# Patient Record
Sex: Female | Born: 1935 | Race: White | Hispanic: No | Marital: Single | Smoking: Never smoker
Health system: Southern US, Community
[De-identification: ages and names within clinical notes are randomized; demographics above are authoritative.]

## PROBLEM LIST (undated history)

## (undated) DIAGNOSIS — I2510 Atherosclerotic heart disease of native coronary artery without angina pectoris: Secondary | ICD-10-CM

## (undated) DIAGNOSIS — I6529 Occlusion and stenosis of unspecified carotid artery: Secondary | ICD-10-CM

## (undated) DIAGNOSIS — I214 Non-ST elevation (NSTEMI) myocardial infarction: Secondary | ICD-10-CM

## (undated) DIAGNOSIS — R0609 Other forms of dyspnea: Secondary | ICD-10-CM

## (undated) DIAGNOSIS — R0600 Dyspnea, unspecified: Secondary | ICD-10-CM

## (undated) DIAGNOSIS — R32 Unspecified urinary incontinence: Secondary | ICD-10-CM

## (undated) DIAGNOSIS — IMO0001 Reserved for inherently not codable concepts without codable children: Secondary | ICD-10-CM

## (undated) DIAGNOSIS — N3090 Cystitis, unspecified without hematuria: Secondary | ICD-10-CM

## (undated) DIAGNOSIS — M1990 Unspecified osteoarthritis, unspecified site: Secondary | ICD-10-CM

## (undated) DIAGNOSIS — I8390 Asymptomatic varicose veins of unspecified lower extremity: Secondary | ICD-10-CM

## (undated) DIAGNOSIS — I10 Essential (primary) hypertension: Secondary | ICD-10-CM

## (undated) DIAGNOSIS — M810 Age-related osteoporosis without current pathological fracture: Secondary | ICD-10-CM

## (undated) DIAGNOSIS — E039 Hypothyroidism, unspecified: Secondary | ICD-10-CM

## (undated) DIAGNOSIS — Z8719 Personal history of other diseases of the digestive system: Secondary | ICD-10-CM

## (undated) DIAGNOSIS — E7800 Pure hypercholesterolemia, unspecified: Secondary | ICD-10-CM

## (undated) DIAGNOSIS — K219 Gastro-esophageal reflux disease without esophagitis: Secondary | ICD-10-CM

## (undated) DIAGNOSIS — R110 Nausea: Secondary | ICD-10-CM

## (undated) HISTORY — DX: Unspecified urinary incontinence: R32

## (undated) HISTORY — DX: Cystitis, unspecified without hematuria: N30.90

## (undated) HISTORY — DX: Personal history of other diseases of the digestive system: Z87.19

## (undated) HISTORY — DX: Reserved for inherently not codable concepts without codable children: IMO0001

## (undated) HISTORY — DX: Unspecified osteoarthritis, unspecified site: M19.90

## (undated) HISTORY — PX: OTHER SURGICAL HISTORY: SHX169

## (undated) HISTORY — DX: Nausea: R11.0

## (undated) HISTORY — DX: Age-related osteoporosis without current pathological fracture: M81.0

## (undated) HISTORY — DX: Essential (primary) hypertension: I10

## (undated) HISTORY — DX: Asymptomatic varicose veins of unspecified lower extremity: I83.90

## (undated) HISTORY — DX: Occlusion and stenosis of unspecified carotid artery: I65.29

## (undated) HISTORY — DX: Gastro-esophageal reflux disease without esophagitis: K21.9

## (undated) HISTORY — PX: ABDOMINAL HYSTERECTOMY: SHX81

## (undated) HISTORY — DX: Pure hypercholesterolemia, unspecified: E78.00

## (undated) HISTORY — DX: Hypothyroidism, unspecified: E03.9

---
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
## 1961-02-14 HISTORY — PX: OTHER SURGICAL HISTORY: SHX169

## 1970-02-14 HISTORY — PX: OTHER SURGICAL HISTORY: SHX169

## 1985-02-14 HISTORY — PX: BREAST REDUCTION SURGERY: SHX8

## 1996-02-15 HISTORY — PX: OTHER SURGICAL HISTORY: SHX169

## 2004-03-11 ENCOUNTER — Ambulatory Visit: Payer: Self-pay | Admitting: Unknown Physician Specialty

## 2004-03-31 ENCOUNTER — Ambulatory Visit: Payer: Self-pay | Admitting: Unknown Physician Specialty

## 2004-06-18 ENCOUNTER — Ambulatory Visit: Payer: Self-pay | Admitting: Unknown Physician Specialty

## 2004-12-16 ENCOUNTER — Ambulatory Visit: Payer: Self-pay | Admitting: Unknown Physician Specialty

## 2005-03-17 HISTORY — PX: OTHER SURGICAL HISTORY: SHX169

## 2005-06-02 ENCOUNTER — Ambulatory Visit: Payer: Self-pay | Admitting: Specialist

## 2005-06-29 ENCOUNTER — Ambulatory Visit: Payer: Self-pay | Admitting: Pain Medicine

## 2005-07-18 ENCOUNTER — Ambulatory Visit: Payer: Self-pay | Admitting: Pain Medicine

## 2005-07-19 ENCOUNTER — Ambulatory Visit: Payer: Self-pay | Admitting: Pain Medicine

## 2005-08-02 ENCOUNTER — Ambulatory Visit: Payer: Self-pay | Admitting: Physician Assistant

## 2005-08-09 ENCOUNTER — Ambulatory Visit: Payer: Self-pay | Admitting: Physician Assistant

## 2005-08-12 ENCOUNTER — Ambulatory Visit: Payer: Self-pay | Admitting: Unknown Physician Specialty

## 2005-08-22 ENCOUNTER — Ambulatory Visit: Payer: Self-pay | Admitting: Pain Medicine

## 2005-08-23 ENCOUNTER — Ambulatory Visit: Payer: Self-pay | Admitting: Pain Medicine

## 2005-09-05 ENCOUNTER — Ambulatory Visit: Payer: Self-pay | Admitting: Pain Medicine

## 2005-09-06 ENCOUNTER — Ambulatory Visit: Payer: Self-pay | Admitting: Pain Medicine

## 2005-09-20 ENCOUNTER — Ambulatory Visit: Payer: Self-pay | Admitting: Physician Assistant

## 2005-11-01 ENCOUNTER — Other Ambulatory Visit: Payer: Self-pay

## 2005-11-08 ENCOUNTER — Inpatient Hospital Stay: Payer: Self-pay | Admitting: Unknown Physician Specialty

## 2005-11-15 ENCOUNTER — Encounter: Payer: Self-pay | Admitting: Internal Medicine

## 2006-02-25 ENCOUNTER — Emergency Department: Payer: Self-pay | Admitting: Emergency Medicine

## 2006-08-17 ENCOUNTER — Ambulatory Visit: Payer: Self-pay | Admitting: Unknown Physician Specialty

## 2007-06-08 ENCOUNTER — Encounter: Payer: Self-pay | Admitting: Rheumatology

## 2007-06-15 ENCOUNTER — Encounter: Payer: Self-pay | Admitting: Rheumatology

## 2007-08-23 ENCOUNTER — Ambulatory Visit: Payer: Self-pay | Admitting: Unknown Physician Specialty

## 2007-11-26 ENCOUNTER — Ambulatory Visit: Payer: Self-pay | Admitting: Pain Medicine

## 2007-12-13 ENCOUNTER — Ambulatory Visit: Payer: Self-pay | Admitting: Pain Medicine

## 2007-12-19 ENCOUNTER — Inpatient Hospital Stay: Payer: Self-pay | Admitting: Specialist

## 2007-12-25 ENCOUNTER — Encounter: Payer: Self-pay | Admitting: Internal Medicine

## 2008-02-03 ENCOUNTER — Emergency Department: Payer: Self-pay | Admitting: Emergency Medicine

## 2008-02-19 ENCOUNTER — Encounter: Payer: Self-pay | Admitting: Specialist

## 2008-03-17 ENCOUNTER — Encounter: Payer: Self-pay | Admitting: Specialist

## 2008-07-24 ENCOUNTER — Ambulatory Visit: Payer: Self-pay | Admitting: Specialist

## 2008-07-31 ENCOUNTER — Ambulatory Visit: Payer: Self-pay | Admitting: Specialist

## 2008-08-28 ENCOUNTER — Ambulatory Visit: Payer: Self-pay | Admitting: Unknown Physician Specialty

## 2009-09-10 ENCOUNTER — Ambulatory Visit: Payer: Self-pay | Admitting: Unknown Physician Specialty

## 2010-01-09 ENCOUNTER — Inpatient Hospital Stay: Payer: Self-pay | Admitting: Specialist

## 2010-01-20 ENCOUNTER — Encounter: Payer: Self-pay | Admitting: Internal Medicine

## 2010-01-28 ENCOUNTER — Ambulatory Visit: Payer: Self-pay | Admitting: Internal Medicine

## 2010-07-19 ENCOUNTER — Ambulatory Visit: Payer: Self-pay | Admitting: Unknown Physician Specialty

## 2010-07-21 LAB — PATHOLOGY REPORT

## 2010-08-05 ENCOUNTER — Encounter: Payer: Self-pay | Admitting: Specialist

## 2010-08-15 ENCOUNTER — Encounter: Payer: Self-pay | Admitting: Specialist

## 2010-09-15 ENCOUNTER — Ambulatory Visit: Payer: Self-pay | Admitting: Specialist

## 2010-09-16 ENCOUNTER — Encounter: Payer: Self-pay | Admitting: Specialist

## 2010-09-22 ENCOUNTER — Ambulatory Visit: Payer: Self-pay | Admitting: Specialist

## 2010-10-12 ENCOUNTER — Ambulatory Visit: Payer: Self-pay | Admitting: Unknown Physician Specialty

## 2010-11-29 ENCOUNTER — Ambulatory Visit: Payer: Self-pay | Admitting: Unknown Physician Specialty

## 2011-02-24 DIAGNOSIS — N3020 Other chronic cystitis without hematuria: Secondary | ICD-10-CM | POA: Diagnosis not present

## 2011-03-18 DIAGNOSIS — N3020 Other chronic cystitis without hematuria: Secondary | ICD-10-CM | POA: Diagnosis not present

## 2011-03-31 DIAGNOSIS — Z961 Presence of intraocular lens: Secondary | ICD-10-CM | POA: Diagnosis not present

## 2011-04-27 DIAGNOSIS — N8111 Cystocele, midline: Secondary | ICD-10-CM | POA: Diagnosis not present

## 2011-04-27 DIAGNOSIS — N3946 Mixed incontinence: Secondary | ICD-10-CM | POA: Diagnosis not present

## 2011-04-27 DIAGNOSIS — N3020 Other chronic cystitis without hematuria: Secondary | ICD-10-CM | POA: Diagnosis not present

## 2011-04-27 DIAGNOSIS — R339 Retention of urine, unspecified: Secondary | ICD-10-CM | POA: Diagnosis not present

## 2011-05-12 DIAGNOSIS — E785 Hyperlipidemia, unspecified: Secondary | ICD-10-CM | POA: Diagnosis not present

## 2011-05-12 DIAGNOSIS — E038 Other specified hypothyroidism: Secondary | ICD-10-CM | POA: Diagnosis not present

## 2011-05-24 DIAGNOSIS — E039 Hypothyroidism, unspecified: Secondary | ICD-10-CM | POA: Diagnosis not present

## 2011-05-24 DIAGNOSIS — J069 Acute upper respiratory infection, unspecified: Secondary | ICD-10-CM | POA: Diagnosis not present

## 2011-05-24 DIAGNOSIS — E785 Hyperlipidemia, unspecified: Secondary | ICD-10-CM | POA: Diagnosis not present

## 2011-05-24 DIAGNOSIS — R5381 Other malaise: Secondary | ICD-10-CM | POA: Diagnosis not present

## 2011-05-26 DIAGNOSIS — M19049 Primary osteoarthritis, unspecified hand: Secondary | ICD-10-CM | POA: Diagnosis not present

## 2011-05-31 DIAGNOSIS — M6530 Trigger finger, unspecified finger: Secondary | ICD-10-CM | POA: Diagnosis not present

## 2011-05-31 DIAGNOSIS — M19049 Primary osteoarthritis, unspecified hand: Secondary | ICD-10-CM | POA: Diagnosis not present

## 2011-05-31 DIAGNOSIS — M6740 Ganglion, unspecified site: Secondary | ICD-10-CM | POA: Diagnosis not present

## 2011-06-09 DIAGNOSIS — M6740 Ganglion, unspecified site: Secondary | ICD-10-CM | POA: Diagnosis not present

## 2011-06-09 DIAGNOSIS — M6530 Trigger finger, unspecified finger: Secondary | ICD-10-CM | POA: Diagnosis not present

## 2011-07-06 DIAGNOSIS — H11429 Conjunctival edema, unspecified eye: Secondary | ICD-10-CM | POA: Diagnosis not present

## 2011-07-08 DIAGNOSIS — R339 Retention of urine, unspecified: Secondary | ICD-10-CM | POA: Diagnosis not present

## 2011-07-08 DIAGNOSIS — N3946 Mixed incontinence: Secondary | ICD-10-CM | POA: Diagnosis not present

## 2011-07-08 DIAGNOSIS — N3020 Other chronic cystitis without hematuria: Secondary | ICD-10-CM | POA: Diagnosis not present

## 2011-07-12 DIAGNOSIS — I8310 Varicose veins of unspecified lower extremity with inflammation: Secondary | ICD-10-CM | POA: Diagnosis not present

## 2011-07-12 DIAGNOSIS — M79609 Pain in unspecified limb: Secondary | ICD-10-CM | POA: Diagnosis not present

## 2011-07-12 DIAGNOSIS — H11429 Conjunctival edema, unspecified eye: Secondary | ICD-10-CM | POA: Diagnosis not present

## 2011-07-12 DIAGNOSIS — E785 Hyperlipidemia, unspecified: Secondary | ICD-10-CM | POA: Diagnosis not present

## 2011-07-12 DIAGNOSIS — I6529 Occlusion and stenosis of unspecified carotid artery: Secondary | ICD-10-CM | POA: Diagnosis not present

## 2011-08-03 DIAGNOSIS — N3020 Other chronic cystitis without hematuria: Secondary | ICD-10-CM | POA: Diagnosis not present

## 2011-08-26 DIAGNOSIS — I6529 Occlusion and stenosis of unspecified carotid artery: Secondary | ICD-10-CM | POA: Diagnosis not present

## 2011-08-26 DIAGNOSIS — I10 Essential (primary) hypertension: Secondary | ICD-10-CM | POA: Diagnosis not present

## 2011-10-25 DIAGNOSIS — E785 Hyperlipidemia, unspecified: Secondary | ICD-10-CM | POA: Diagnosis not present

## 2011-10-25 DIAGNOSIS — M810 Age-related osteoporosis without current pathological fracture: Secondary | ICD-10-CM | POA: Diagnosis not present

## 2011-10-25 DIAGNOSIS — E039 Hypothyroidism, unspecified: Secondary | ICD-10-CM | POA: Diagnosis not present

## 2011-10-26 DIAGNOSIS — E785 Hyperlipidemia, unspecified: Secondary | ICD-10-CM | POA: Diagnosis not present

## 2011-10-26 DIAGNOSIS — E039 Hypothyroidism, unspecified: Secondary | ICD-10-CM | POA: Diagnosis not present

## 2011-10-26 DIAGNOSIS — M810 Age-related osteoporosis without current pathological fracture: Secondary | ICD-10-CM | POA: Diagnosis not present

## 2011-11-03 DIAGNOSIS — M6530 Trigger finger, unspecified finger: Secondary | ICD-10-CM | POA: Diagnosis not present

## 2011-11-10 DIAGNOSIS — M6530 Trigger finger, unspecified finger: Secondary | ICD-10-CM | POA: Diagnosis not present

## 2011-11-15 DIAGNOSIS — Z23 Encounter for immunization: Secondary | ICD-10-CM | POA: Diagnosis not present

## 2011-11-23 DIAGNOSIS — N390 Urinary tract infection, site not specified: Secondary | ICD-10-CM | POA: Diagnosis not present

## 2011-11-23 DIAGNOSIS — N3020 Other chronic cystitis without hematuria: Secondary | ICD-10-CM | POA: Insufficient documentation

## 2011-11-23 DIAGNOSIS — N3946 Mixed incontinence: Secondary | ICD-10-CM | POA: Diagnosis not present

## 2011-11-23 DIAGNOSIS — N8111 Cystocele, midline: Secondary | ICD-10-CM | POA: Diagnosis not present

## 2011-11-23 DIAGNOSIS — M545 Low back pain, unspecified: Secondary | ICD-10-CM | POA: Insufficient documentation

## 2011-11-23 DIAGNOSIS — R339 Retention of urine, unspecified: Secondary | ICD-10-CM | POA: Insufficient documentation

## 2011-11-23 DIAGNOSIS — N952 Postmenopausal atrophic vaginitis: Secondary | ICD-10-CM | POA: Insufficient documentation

## 2011-11-25 DIAGNOSIS — M47817 Spondylosis without myelopathy or radiculopathy, lumbosacral region: Secondary | ICD-10-CM | POA: Diagnosis not present

## 2011-11-25 DIAGNOSIS — M6530 Trigger finger, unspecified finger: Secondary | ICD-10-CM | POA: Diagnosis not present

## 2011-12-02 ENCOUNTER — Ambulatory Visit: Payer: Self-pay | Admitting: Obstetrics and Gynecology

## 2011-12-02 DIAGNOSIS — Z1231 Encounter for screening mammogram for malignant neoplasm of breast: Secondary | ICD-10-CM | POA: Diagnosis not present

## 2011-12-09 DIAGNOSIS — E039 Hypothyroidism, unspecified: Secondary | ICD-10-CM | POA: Diagnosis not present

## 2011-12-28 DIAGNOSIS — N3020 Other chronic cystitis without hematuria: Secondary | ICD-10-CM | POA: Diagnosis not present

## 2012-02-28 ENCOUNTER — Ambulatory Visit (INDEPENDENT_AMBULATORY_CARE_PROVIDER_SITE_OTHER): Payer: Medicare Other | Admitting: Internal Medicine

## 2012-02-28 ENCOUNTER — Encounter: Payer: Self-pay | Admitting: Internal Medicine

## 2012-02-28 VITALS — BP 130/86 | HR 84 | Temp 97.6°F | Ht 59.5 in | Wt 145.2 lb

## 2012-02-28 DIAGNOSIS — R030 Elevated blood-pressure reading, without diagnosis of hypertension: Secondary | ICD-10-CM | POA: Diagnosis not present

## 2012-02-28 DIAGNOSIS — E7800 Pure hypercholesterolemia, unspecified: Secondary | ICD-10-CM

## 2012-02-28 DIAGNOSIS — M79609 Pain in unspecified limb: Secondary | ICD-10-CM | POA: Diagnosis not present

## 2012-02-28 DIAGNOSIS — E039 Hypothyroidism, unspecified: Secondary | ICD-10-CM | POA: Diagnosis not present

## 2012-02-28 DIAGNOSIS — M79606 Pain in leg, unspecified: Secondary | ICD-10-CM

## 2012-02-28 DIAGNOSIS — M810 Age-related osteoporosis without current pathological fracture: Secondary | ICD-10-CM

## 2012-02-28 DIAGNOSIS — K219 Gastro-esophageal reflux disease without esophagitis: Secondary | ICD-10-CM

## 2012-02-28 DIAGNOSIS — M1990 Unspecified osteoarthritis, unspecified site: Secondary | ICD-10-CM

## 2012-02-28 DIAGNOSIS — R32 Unspecified urinary incontinence: Secondary | ICD-10-CM

## 2012-02-28 DIAGNOSIS — R29898 Other symptoms and signs involving the musculoskeletal system: Secondary | ICD-10-CM

## 2012-02-28 LAB — BASIC METABOLIC PANEL
Calcium: 10.2 mg/dL (ref 8.4–10.5)
Creatinine, Ser: 1 mg/dL (ref 0.4–1.2)
GFR: 58 mL/min — ABNORMAL LOW (ref >60.00)
Glucose, Bld: 90 mg/dL (ref 70–99)
Sodium: 142 mEq/L (ref 135–145)

## 2012-02-28 LAB — CBC WITH DIFFERENTIAL/PLATELET
Eosinophils Relative: 4.4 % (ref 0.0–5.0)
HCT: 43.5 % (ref 36.0–46.0)
Hemoglobin: 14.5 g/dL (ref 12.0–15.0)
Lymphocytes Relative: 26.3 % (ref 12.0–46.0)
Lymphs Abs: 1.6 10*3/uL (ref 0.7–4.0)
Monocytes Relative: 7.7 % (ref 3.0–12.0)
Neutro Abs: 3.7 10*3/uL (ref 1.4–7.7)
Platelets: 204 10*3/uL (ref 150.0–400.0)
WBC: 6 10*3/uL (ref 4.5–10.5)

## 2012-02-28 LAB — LIPID PANEL
HDL: 41.9 mg/dL (ref >39.00)
Triglycerides: 164 mg/dL — ABNORMAL HIGH (ref 0.0–149.0)
VLDL: 32.8 mg/dL (ref 0.0–40.0)

## 2012-02-28 LAB — HEPATIC FUNCTION PANEL
Albumin: 4.2 g/dL (ref 3.5–5.2)
Alkaline Phosphatase: 44 U/L (ref 39–117)
Total Protein: 7.1 g/dL (ref 6.0–8.3)

## 2012-02-28 LAB — TSH: TSH: 0.64 u[IU]/mL (ref 0.35–5.50)

## 2012-02-28 LAB — MAGNESIUM: Magnesium: 1.8 mg/dL (ref 1.5–2.5)

## 2012-02-28 MED ORDER — RANITIDINE HCL 150 MG PO TABS: 150.0000 mg | ORAL_TABLET | Freq: Every day | ORAL | Status: DC

## 2012-02-28 MED ORDER — CLOTRIMAZOLE-BETAMETHASONE 1-0.05 % EX CREA: TOPICAL_CREAM | Freq: Two times a day (BID) | CUTANEOUS | Status: DC

## 2012-02-28 NOTE — Patient Instructions (Addendum)
Stop the omeprazole.  Start ranitidine 150mg  - one per day.

## 2012-03-02 ENCOUNTER — Encounter: Payer: Self-pay | Admitting: Internal Medicine

## 2012-03-02 DIAGNOSIS — M1990 Unspecified osteoarthritis, unspecified site: Secondary | ICD-10-CM | POA: Insufficient documentation

## 2012-03-02 DIAGNOSIS — E039 Hypothyroidism, unspecified: Secondary | ICD-10-CM | POA: Insufficient documentation

## 2012-03-02 DIAGNOSIS — R32 Unspecified urinary incontinence: Secondary | ICD-10-CM | POA: Insufficient documentation

## 2012-03-02 DIAGNOSIS — K219 Gastro-esophageal reflux disease without esophagitis: Secondary | ICD-10-CM | POA: Insufficient documentation

## 2012-03-02 DIAGNOSIS — M810 Age-related osteoporosis without current pathological fracture: Secondary | ICD-10-CM | POA: Insufficient documentation

## 2012-03-02 NOTE — Assessment & Plan Note (Signed)
On thyroid replacement.  Just recently adjusted.  Check tsh.

## 2012-03-02 NOTE — Progress Notes (Signed)
Subjective:    Patient ID: Julia Patterson, female    DOB: Feb 16, 1929, 79 y.o.   MRN: [id number]  HPI 79 year old female with past history of hypercholesterolemia, hypothyroidism, osteoporosis s/p multiple bisphosphonates and Forteo with spontaneous femur fractures.  She also sees Dr Achilles Dunk for her urinary incontinence.  She comes in today to establish care.  Former patient of Dr Lin Givens.  She reports she has noticed some increased fatigue in her legs.  No pain.  She has had two femur fractures.  Second occurred after treatment with Forteo.  Has recently seen Dr Gavin Potters.  Per pt report - no further medication warranted for the osteoporosis.  Instructed to continue calcium and vitamin D.  No chest pain or tightness.  Breathing stable.  Legs just feel weak.  If standing for any length of time, has to sit down to rest.  She has been experiencing leg cramps.  She had previously started CoQ10 with some improvement.  She is now taking two per day and still will notice some cramping.  Eating and drinking well.  She was questioning if the prilosec could be causing some of the leg cramps.  Felt it started after she started this medication.  She has no acid reflux.  Some persistent "congestion" in her throat.  No swallowing difficulties.    Past Medical History  Diagnosis Date  . GERD (gastroesophageal reflux disease)   . Hypothyroidism   . Hypercholesterolemia   . Urinary incontinence   . Osteoporosis     s/p fosamax, boniva, reclast, forteo.  bilateral spontaneous femur fractures  . Recurrent cystitis   . Varicose veins     chronic venous insufficiency  . Degenerative arthritis     lumbar spine.  spondylolisthesis, hip and leg pain, scoliosis    Current Outpatient Prescriptions on File Prior to Visit  Medication Sig Dispense Refill  . Calcium Citrate-Vitamin D (CITRACAL + D PO) Take by mouth 3 (three) times daily.      Marland Kitchen omeprazole (PRILOSEC) 20 MG capsule Take 20 mg by mouth daily.      .  Potassium Gluconate 595 MG CAPS Take by mouth daily.      . pravastatin (PRAVACHOL) 20 MG tablet Take 20 mg by mouth daily.      . ranitidine (ZANTAC) 150 MG tablet Take 1 tablet (150 mg total) by mouth daily.  30 tablet  5    Review of Systems Patient denies any headache, lightheadedness or dizziness.  No nasal congestion.  Does report feeling as if there is congestion in her throat.  No swallowing problems.  Denies any acid reflux.   No chest pain, tightness or palpitations.  No increased shortness of breath, cough or congestion.  Breathing stable.  No nausea or vomiting.  No abdominal pain or cramping.  No bowel change, such as diarrhea, constipation, BRBPR or melana.  Sees Dr Achilles Dunk for her urinary incontinence.  On Toviaz. Has a persistent "patch" on her back that itches.  She has used various otc creams.  No relief.  No other rash.  Leg cramps and weakness as outlined.      Objective:   Physical Exam Filed Vitals:   02/28/12 0841  BP: 130/86  Pulse: 84  Temp: 97.6 F (22.37 C)   79 year old female in no acute distress.   HEENT:  Nares - clear.  OP- without lesions or erythema.  No sinus tenderness to palpation.  NECK:  Supple, nontender.     HEART:  Appears to be regular. LUNGS:  Without crackles or wheezing audible.  Respirations even and unlabored.   RADIAL PULSE:  Equal bilaterally.  ABDOMEN:  Soft, nontender.  No audible abdominal bruit.   EXTREMITIES:  No increased edema to be present.  No increased redness.   MSK:  No tenderness to palpation over the lower extremities.  Able to sit and stand without significant difficulties.    DERM:  Small circular rash (erythematous based) - mid to upper back.                 Assessment & Plan:  LEG WEAKNESS.  Probably multifactorial.  Has had two previous femur fractures.  Able to sit, stand and walk without significant difficulty - just legs tire easily.  Will have physical therapy evaluate and treat - strengthening exercises.    LEG  CRAMPS.  Unclear as to the exact etiology.  Encouraged her to stay hydrated.  Doubt omeprazole.  She is not having increased acid reflux.  Will change to Ranitidine.  Discussed the pravastatin.  States she has been on pravastatin for years.  Cramps are intermittent.  Will hold on stopping the pravastatin at this time.  Check electrolytes and magnesium.  Stretching exercises.  Follow.    RASH.  As outlined.  Trial of Lotrisone cream as directed.  Let me know if persistent.   HEALTH MAINTENANCE.  Will need to get her scheduled for a physical.  Obtain outside records.  Will need to keep on track with her mammograms, etc.  Pneumovax 08/28/09. Colonoscopy 06/18/04 - normal.   I spent 45 minutes with this patient and more than 50% of the time was spent in consultation regarding the above.

## 2012-03-02 NOTE — Assessment & Plan Note (Signed)
No symptoms.  Stop prilosec.  Start Ranitidine 150mg  q day as directed.  Follow.

## 2012-03-02 NOTE — Assessment & Plan Note (Signed)
On pravastatin.  Continue for now.  Check lipid panel and liver function.

## 2012-03-02 NOTE — Assessment & Plan Note (Signed)
Has tried fosamax, boniva, reclast and Forteo. Two spontaneous femur fractures.  Evaluated by Dr Kernodle.  Recommended continuing calcium and vitamin D.  Follow vitamin D level.      

## 2012-03-02 NOTE — Assessment & Plan Note (Signed)
Has seen Dr Gavin Potters.  Physical therapy.

## 2012-03-02 NOTE — Assessment & Plan Note (Signed)
Sees Dr Achilles Dunk.  On Toviaz.

## 2012-03-06 ENCOUNTER — Encounter: Payer: Self-pay | Admitting: *Deleted

## 2012-03-20 ENCOUNTER — Encounter: Payer: Self-pay | Admitting: Internal Medicine

## 2012-03-20 DIAGNOSIS — M6281 Muscle weakness (generalized): Secondary | ICD-10-CM | POA: Diagnosis not present

## 2012-03-20 DIAGNOSIS — IMO0001 Reserved for inherently not codable concepts without codable children: Secondary | ICD-10-CM | POA: Diagnosis not present

## 2012-03-27 ENCOUNTER — Telehealth: Payer: Self-pay | Admitting: Internal Medicine

## 2012-03-27 NOTE — Telephone Encounter (Signed)
Patient Information:  Caller Name: Kadey  Phone: [phone]  Patient: Julia Patterson  Gender: Female  DOB: [birth year]  Age: 79 Years  PCP: [facility]  Office Follow Up:  Does the office need to follow up with this patient?: No  Instructions For The Office: N/A   Symptoms  Reason For Call & Symptoms: Hoarseness for past 8 weeks and dry cough past 10 days. No other sx. She started on Toviaz on Dec 8th but call urologist and was told that it does not cause hoarseness. .  Reviewed Health History In EMR: Yes  Reviewed Medications In EMR: Yes  Reviewed Allergies In EMR: Yes  Reviewed Surgeries / Procedures: Yes  Date of Onset of Symptoms: 03/17/2012  Treatments Tried: Sudafed  Treatments Tried Worked: No  Guideline(s) Used:  Cough  Disposition Per Guideline:   See Within 3 Days in Office  Reason For Disposition Reached:   Cough has been present for > 10 days  Advice Given:  Reassurance  Coughing is the way that our lungs remove irritants and mucus. It helps protect our lungs from getting pneumonia.  You can get a dry hacking cough after a chest cold. Sometimes this type of cough can last 1-3 weeks, and be worse at night.  You can also get a cough after being exposed to irritating substances like smoke, strong perfumes, and dust.  Cough Medicines:  OTC Cough Drops: Cough drops can help a lot, especially for mild coughs. They reduce coughing by soothing your irritated throat and removing that tickle sensation in the back of the throat. Cough drops also have the advantage of portability - you can carry them with you.  Prevent Dehydration:  Drink adequate liquids.  This will help soothe an irritated or dry throat and loosen up the phlegm.  Expected Course:   The expected course depends on what is causing the cough.  Viral bronchitis (chest cold) causes a cough that lasts 1 to 3 weeks. Sometimes you may cough up lots of phlegm (sputum, mucus). The mucus can normally  be white, gray, yellow, or green.  Call Back If:  Difficulty breathing  Cough lasts more than 3 weeks  Fever lasts > 3 days  You become worse.  Patient Refused Recommendation:  Patient Refused Appt, Patient Requests Appt At Later Date  She will call back to schedule appointment after checking weather report.

## 2012-04-02 ENCOUNTER — Ambulatory Visit: Payer: Medicare Other | Admitting: Internal Medicine

## 2012-04-14 ENCOUNTER — Encounter: Payer: Self-pay | Admitting: Internal Medicine

## 2012-04-14 DIAGNOSIS — M6281 Muscle weakness (generalized): Secondary | ICD-10-CM | POA: Diagnosis not present

## 2012-04-14 DIAGNOSIS — IMO0001 Reserved for inherently not codable concepts without codable children: Secondary | ICD-10-CM | POA: Diagnosis not present

## 2012-04-24 ENCOUNTER — Ambulatory Visit (INDEPENDENT_AMBULATORY_CARE_PROVIDER_SITE_OTHER): Payer: Medicare Other | Admitting: Internal Medicine

## 2012-04-24 ENCOUNTER — Encounter: Payer: Self-pay | Admitting: Internal Medicine

## 2012-04-24 VITALS — BP 120/70 | HR 87 | Temp 98.4°F | Ht 59.5 in

## 2012-04-24 DIAGNOSIS — K219 Gastro-esophageal reflux disease without esophagitis: Secondary | ICD-10-CM

## 2012-04-24 DIAGNOSIS — N644 Mastodynia: Secondary | ICD-10-CM

## 2012-04-24 DIAGNOSIS — R32 Unspecified urinary incontinence: Secondary | ICD-10-CM

## 2012-04-24 DIAGNOSIS — E7800 Pure hypercholesterolemia, unspecified: Secondary | ICD-10-CM

## 2012-04-24 DIAGNOSIS — I10 Essential (primary) hypertension: Secondary | ICD-10-CM

## 2012-04-24 DIAGNOSIS — M810 Age-related osteoporosis without current pathological fracture: Secondary | ICD-10-CM

## 2012-04-24 DIAGNOSIS — E039 Hypothyroidism, unspecified: Secondary | ICD-10-CM

## 2012-04-25 ENCOUNTER — Encounter: Payer: Self-pay | Admitting: Internal Medicine

## 2012-04-25 DIAGNOSIS — R399 Unspecified symptoms and signs involving the genitourinary system: Secondary | ICD-10-CM | POA: Insufficient documentation

## 2012-04-25 DIAGNOSIS — N23 Unspecified renal colic: Secondary | ICD-10-CM | POA: Diagnosis not present

## 2012-04-25 DIAGNOSIS — I10 Essential (primary) hypertension: Secondary | ICD-10-CM | POA: Insufficient documentation

## 2012-04-25 DIAGNOSIS — R1011 Right upper quadrant pain: Secondary | ICD-10-CM | POA: Diagnosis not present

## 2012-04-25 DIAGNOSIS — N3941 Urge incontinence: Secondary | ICD-10-CM | POA: Diagnosis not present

## 2012-04-25 DIAGNOSIS — R3989 Other symptoms and signs involving the genitourinary system: Secondary | ICD-10-CM | POA: Diagnosis not present

## 2012-04-25 NOTE — Assessment & Plan Note (Signed)
On thyroid replacement.  Just recently adjusted.  Follow tsh.

## 2012-04-25 NOTE — Assessment & Plan Note (Signed)
Has tried fosamax, boniva, reclast and Forteo. Two spontaneous femur fractures.  Evaluated by Dr Gavin Potters.  Recommended continuing calcium and vitamin D.  Follow vitamin D level.

## 2012-04-25 NOTE — Assessment & Plan Note (Signed)
Back on omeprazole now.  Symptoms improved.  Had immediate return of symptoms when she stopped the omeprazole for a short period of time.  Discussed EGD - especially given that she has never had one and given she had immediate return of symptoms off the PPI.  She declines.  Will follow.  No dysphagia.

## 2012-04-25 NOTE — Assessment & Plan Note (Signed)
Blood pressure on her checks averaging 120-140/70s.  Continue to monitor.  Hold on any further intervention at this time.  Follow.

## 2012-04-25 NOTE — Assessment & Plan Note (Signed)
Sees Dr Achilles Dunk.  On Detrol now.  Has follow up planned tomorrow.

## 2012-04-25 NOTE — Assessment & Plan Note (Signed)
On pravastatin.  Continue for now.  Follow lipid panel and liver function.

## 2012-04-25 NOTE — Progress Notes (Signed)
Subjective:    Patient ID: Julia Patterson, female    DOB: 04-20-1929, 79 y.o.   MRN: [id number]  HPI 79 year old female with past history of hypercholesterolemia, hypothyroidism, osteoporosis s/p multiple bisphosphonates and Forteo with spontaneous femur fractures.  She also sees Dr Achilles Dunk for her urinary incontinence.  She comes in today for a scheduled follow up.  She has had two femur fractures.  Second occurred after treatment with Forteo.  Has recently seen Dr Gavin Potters.  Per pt report - no further medication warranted for the osteoporosis.  Instructed to continue calcium and vitamin D.  No chest pain or tightness.  Breathing stable.  She has noticed some left breast tenderness.  Intermittent, but persistent.  No nipple discharge.  She had been experiencing leg cramps.  She had previously started CoQ10 with noted improvement.  Not a significant issue for her now.  Eating and drinking well.  She questioned if the prilosec could be causing some of the leg cramps.  She stopped the prilosec for a brief period and noticed no change in the cramping.  She did have return of acid reflux and restarted the prilosec - with noted improvement.  No significant acid reflux now.  No dysphagia.  She reports that she did not tolerate the Toviaz.  Is back on Detrol now.  States this is working relatively well.  Tolerating.  She has noticed some left back discomfort and some intermittent chills.  No dysuria.  She is concerned she may have a uti.  Due to see Dr Achilles Dunk tomorrow.  She has also noticed some RUQ discomfort.  No known trigger.  Not necessarily worsened by eating.  No nausea or vomiting.  Not associated with any bowel change.      Past Medical History  Diagnosis Date  . GERD (gastroesophageal reflux disease)   . Hypothyroidism   . Hypercholesterolemia   . Urinary incontinence   . Osteoporosis     s/p fosamax, boniva, reclast, forteo.  bilateral spontaneous femur fractures  . Recurrent cystitis   .  Varicose veins     chronic venous insufficiency  . Degenerative arthritis     lumbar spine.  spondylolisthesis, hip and leg pain, scoliosis    Current Outpatient Prescriptions on File Prior to Visit  Medication Sig Dispense Refill  . acetaminophen (TYLENOL 8 HOUR) 650 MG CR tablet Take 650 mg by mouth 2 (two) times daily.      Marland Kitchen aspirin 81 MG tablet Take 81 mg by mouth daily.      . Calcium Citrate-Vitamin D (CITRACAL + D PO) Take by mouth 3 (three) times daily.      . cholecalciferol (VITAMIN D) 1000 UNITS tablet Take 1,000 Units by mouth 2 (two) times daily.      . clotrimazole-betamethasone (LOTRISONE) cream Apply topically 2 (two) times daily.  30 g  0  . Coenzyme Q10 (COQ10) 100 MG CAPS Take 1 or 2 times daily as needed      . levothyroxine (SYNTHROID, LEVOTHROID) 88 MCG tablet Take 88 mcg by mouth daily.      . Multiple Vitamin (MULTIVITAMIN) tablet Take 1 tablet by mouth daily.      . Naproxen Sodium (ALEVE) 220 MG CAPS Take by mouth. Take two by mouth once a day      . nitrofurantoin (MACRODANTIN) 100 MG capsule Take 100 mg by mouth daily.      Marland Kitchen omeprazole (PRILOSEC) 20 MG capsule Take 20 mg by mouth daily.      Marland Kitchen  Potassium Gluconate 595 MG CAPS Take by mouth daily.      . pravastatin (PRAVACHOL) 20 MG tablet Take 20 mg by mouth daily.      . fesoterodine (TOVIAZ) 8 MG TB24 Take 8 mg by mouth daily.      . ranitidine (ZANTAC) 150 MG tablet Take 1 tablet (150 mg total) by mouth daily.  30 tablet  5   No current facility-administered medications on file prior to visit.    Review of Systems Patient denies any headache, lightheadedness or dizziness.  No nasal congestion.  No swallowing problems.  Denies any acid reflux currently.  States under reasonable control now that she is back on the omeprazole.   No chest pain, tightness or palpitations.  No increased shortness of breath, cough or congestion.  Breathing stable.  No nausea or vomiting.  RUQ pain as outlined.  No bowel change,  such as diarrhea, constipation, BRBPR or melana.  Sees Dr Achilles Dunk for her urinary incontinence.  On Detrol now.  See above.   Left back pain and chills as outlined.  No dysuria.  No vaginal symptoms.      Objective:   Physical Exam  Filed Vitals:   04/24/12 1407  BP: 120/70  Pulse: 87  Temp: 98.4 F (36.9 C)   Blood pressure recheck:  53/38  79 year old female in no acute distress.   HEENT:  Nares - clear.  OP- without lesions or erythema.  No sinus tenderness to palpation.  NECK:  Supple, nontender.     HEART:  Appears to be regular. LUNGS:  Without crackles or wheezing audible.  Respirations even and unlabored.   RADIAL PULSE:  Equal bilaterally.  ABDOMEN:  Soft.  Some RUQ tenderness to palpation.  No rebound or guarding.   No audible abdominal bruit.   EXTREMITIES:  No increased edema to be present.  No increased redness.   MSK:  No tenderness to palpation over the lower extremities.  Able to sit and stand without significant difficulties.    BACK:  No CVA tenderness.                 Assessment & Plan:  RUQ PAIN.  See above.  Discussed abdominal ultrasound.  She has follow up with Dr Achilles Dunk tomorrow.  Wants to discuss with him.  States he was planning an ultrasound.  Was questioning whether or not "duplicate testing".  Will await his assessment.  Further w/up pending.  She will call and notify me if she wishes to proceed.  Continue on omeprazole.  Discussed EGD - especially given immediate return of symptoms/acid reflux with stopping the PPI for a short period of time.  Has never had EGD.  She declines.    LEG CRAMPS.  On pravastatin.  Cramps are better on CoQ10.  Not a significant issue for her now.  Follow.    GU.  Has the back pain and intermittent chills as outlined.  She was questioning - uti.  Offered to check urine today.  She declined.  States she is due to see Dr Achilles Dunk tomorrow and wants to wait until that appt for further evaluation.     HEALTH MAINTENANCE.  Will need to get  her scheduled for a physical.   Will need to keep on track with her mammograms, etc.  Pneumovax 08/28/09. Colonoscopy 06/18/04 - normal.

## 2012-04-26 DIAGNOSIS — IMO0001 Reserved for inherently not codable concepts without codable children: Secondary | ICD-10-CM | POA: Diagnosis not present

## 2012-04-26 DIAGNOSIS — M6281 Muscle weakness (generalized): Secondary | ICD-10-CM | POA: Diagnosis not present

## 2012-04-27 ENCOUNTER — Ambulatory Visit: Payer: Self-pay | Admitting: Urology

## 2012-04-27 DIAGNOSIS — K449 Diaphragmatic hernia without obstruction or gangrene: Secondary | ICD-10-CM | POA: Diagnosis not present

## 2012-04-27 DIAGNOSIS — R918 Other nonspecific abnormal finding of lung field: Secondary | ICD-10-CM | POA: Diagnosis not present

## 2012-04-27 DIAGNOSIS — N269 Renal sclerosis, unspecified: Secondary | ICD-10-CM | POA: Diagnosis not present

## 2012-04-27 DIAGNOSIS — N200 Calculus of kidney: Secondary | ICD-10-CM | POA: Diagnosis not present

## 2012-05-01 DIAGNOSIS — N200 Calculus of kidney: Secondary | ICD-10-CM | POA: Diagnosis not present

## 2012-05-01 DIAGNOSIS — C670 Malignant neoplasm of trigone of bladder: Secondary | ICD-10-CM | POA: Diagnosis not present

## 2012-05-01 DIAGNOSIS — N3020 Other chronic cystitis without hematuria: Secondary | ICD-10-CM | POA: Diagnosis not present

## 2012-05-01 DIAGNOSIS — D4120 Neoplasm of uncertain behavior of unspecified ureter: Secondary | ICD-10-CM | POA: Diagnosis not present

## 2012-05-01 DIAGNOSIS — R339 Retention of urine, unspecified: Secondary | ICD-10-CM | POA: Diagnosis not present

## 2012-05-01 DIAGNOSIS — D419 Neoplasm of uncertain behavior of unspecified urinary organ: Secondary | ICD-10-CM | POA: Insufficient documentation

## 2012-05-01 DIAGNOSIS — N23 Unspecified renal colic: Secondary | ICD-10-CM | POA: Diagnosis not present

## 2012-05-03 DIAGNOSIS — M6281 Muscle weakness (generalized): Secondary | ICD-10-CM | POA: Diagnosis not present

## 2012-05-03 DIAGNOSIS — IMO0001 Reserved for inherently not codable concepts without codable children: Secondary | ICD-10-CM | POA: Diagnosis not present

## 2012-05-08 DIAGNOSIS — M6281 Muscle weakness (generalized): Secondary | ICD-10-CM | POA: Diagnosis not present

## 2012-05-08 DIAGNOSIS — IMO0001 Reserved for inherently not codable concepts without codable children: Secondary | ICD-10-CM | POA: Diagnosis not present

## 2012-05-10 ENCOUNTER — Ambulatory Visit: Payer: Self-pay | Admitting: Urology

## 2012-05-10 ENCOUNTER — Ambulatory Visit: Payer: Self-pay | Admitting: Internal Medicine

## 2012-05-10 DIAGNOSIS — K449 Diaphragmatic hernia without obstruction or gangrene: Secondary | ICD-10-CM | POA: Diagnosis not present

## 2012-05-10 DIAGNOSIS — N200 Calculus of kidney: Secondary | ICD-10-CM | POA: Diagnosis not present

## 2012-05-10 DIAGNOSIS — N289 Disorder of kidney and ureter, unspecified: Secondary | ICD-10-CM | POA: Diagnosis not present

## 2012-05-10 DIAGNOSIS — Z9889 Other specified postprocedural states: Secondary | ICD-10-CM | POA: Diagnosis not present

## 2012-05-10 DIAGNOSIS — N269 Renal sclerosis, unspecified: Secondary | ICD-10-CM | POA: Diagnosis not present

## 2012-05-10 DIAGNOSIS — M4310 Spondylolisthesis, site unspecified: Secondary | ICD-10-CM | POA: Diagnosis not present

## 2012-05-10 DIAGNOSIS — M5137 Other intervertebral disc degeneration, lumbosacral region: Secondary | ICD-10-CM | POA: Diagnosis not present

## 2012-05-14 ENCOUNTER — Telehealth: Payer: Self-pay | Admitting: *Deleted

## 2012-05-14 NOTE — Telephone Encounter (Signed)
Called both numbers for patient to inform of mammography and ultrasound results. Unable to leave msg.

## 2012-05-15 ENCOUNTER — Encounter: Payer: Self-pay | Admitting: Internal Medicine

## 2012-05-15 DIAGNOSIS — IMO0001 Reserved for inherently not codable concepts without codable children: Secondary | ICD-10-CM | POA: Diagnosis not present

## 2012-05-15 DIAGNOSIS — M6281 Muscle weakness (generalized): Secondary | ICD-10-CM | POA: Diagnosis not present

## 2012-05-15 NOTE — Telephone Encounter (Signed)
Gave mammo and ultrasound results to patient. Patient stated that pain was subsiding.

## 2012-05-15 NOTE — Telephone Encounter (Signed)
Let us know if persistent problem and if does not resolve or if needs eval.

## 2012-05-15 NOTE — Telephone Encounter (Signed)
Already advised patient that if pain presisted we would reeval.

## 2012-05-29 ENCOUNTER — Encounter: Payer: Self-pay | Admitting: Internal Medicine

## 2012-05-29 DIAGNOSIS — I701 Atherosclerosis of renal artery: Secondary | ICD-10-CM | POA: Diagnosis not present

## 2012-05-29 DIAGNOSIS — I6529 Occlusion and stenosis of unspecified carotid artery: Secondary | ICD-10-CM | POA: Diagnosis not present

## 2012-05-29 DIAGNOSIS — I10 Essential (primary) hypertension: Secondary | ICD-10-CM | POA: Diagnosis not present

## 2012-05-30 DIAGNOSIS — I701 Atherosclerosis of renal artery: Secondary | ICD-10-CM | POA: Diagnosis not present

## 2012-05-30 DIAGNOSIS — E785 Hyperlipidemia, unspecified: Secondary | ICD-10-CM | POA: Diagnosis not present

## 2012-05-30 DIAGNOSIS — N289 Disorder of kidney and ureter, unspecified: Secondary | ICD-10-CM | POA: Diagnosis not present

## 2012-05-30 DIAGNOSIS — I6529 Occlusion and stenosis of unspecified carotid artery: Secondary | ICD-10-CM | POA: Diagnosis not present

## 2012-06-19 DIAGNOSIS — M76899 Other specified enthesopathies of unspecified lower limb, excluding foot: Secondary | ICD-10-CM | POA: Diagnosis not present

## 2012-06-28 ENCOUNTER — Encounter: Payer: Self-pay | Admitting: Internal Medicine

## 2012-06-28 ENCOUNTER — Ambulatory Visit (INDEPENDENT_AMBULATORY_CARE_PROVIDER_SITE_OTHER): Payer: Medicare Other | Admitting: Internal Medicine

## 2012-06-28 VITALS — BP 120/70 | HR 85 | Temp 97.7°F | Ht 59.5 in | Wt 141.5 lb

## 2012-06-28 DIAGNOSIS — R32 Unspecified urinary incontinence: Secondary | ICD-10-CM | POA: Diagnosis not present

## 2012-06-28 DIAGNOSIS — M1990 Unspecified osteoarthritis, unspecified site: Secondary | ICD-10-CM

## 2012-06-28 DIAGNOSIS — E7800 Pure hypercholesterolemia, unspecified: Secondary | ICD-10-CM | POA: Diagnosis not present

## 2012-06-28 DIAGNOSIS — M810 Age-related osteoporosis without current pathological fracture: Secondary | ICD-10-CM | POA: Diagnosis not present

## 2012-06-28 DIAGNOSIS — Z961 Presence of intraocular lens: Secondary | ICD-10-CM | POA: Diagnosis not present

## 2012-06-28 DIAGNOSIS — E039 Hypothyroidism, unspecified: Secondary | ICD-10-CM

## 2012-06-28 DIAGNOSIS — I10 Essential (primary) hypertension: Secondary | ICD-10-CM

## 2012-06-28 DIAGNOSIS — K219 Gastro-esophageal reflux disease without esophagitis: Secondary | ICD-10-CM

## 2012-06-28 NOTE — Progress Notes (Signed)
Subjective:    Patient ID: Julia Patterson, female    DOB: [birth year], 79 y.o.   MRN: [id number]  HPI 79 year old female with past history of hypercholesterolemia, hypothyroidism, osteoporosis s/p multiple bisphosphonates and Forteo with spontaneous femur fractures.  She also sees Dr Achilles Dunk for her urinary incontinence.  She comes in today for a scheduled follow up.  She has had two femur fractures.  Second occurred after treatment with Forteo.  Has seen Dr Gavin Potters.  Per pt report - no further medication warranted for the osteoporosis.  Instructed to continue calcium and vitamin D.  No chest pain or tightness.  Breathing stable.  She had noticed some left breast tenderness.  Mammogram negative.  This has resolved.  Previously had some leg cramps.  She had previously started CoQ10 with noted improvement.  Not a significant issue for her now.  Eating and drinking well. On Prilosec.  No significant acid reflux now.  No dysphagia.  She reports that she did not tolerate the Toviaz.  Is back on Detrol now.  States this is working relatively well.  Tolerating.  Is seeing Dr Achilles Dunk.  Was told she had RAS.  Has had ultrasound testing and has seen vascular.  Reports plan is to f/u in six months.  3-4 weeks ago, started having some left hip and leg pain.  Saw Dr Reita Chard.  S/p injection.  Doing some better.  Told she had arthritis.  Planning to f/u with him this next week.      Past Medical History  Diagnosis Date  . GERD (gastroesophageal reflux disease)   . Hypothyroidism   . Hypercholesterolemia   . Urinary incontinence   . Osteoporosis     s/p fosamax, boniva, reclast, forteo.  bilateral spontaneous femur fractures  . Recurrent cystitis   . Varicose veins     chronic venous insufficiency  . Degenerative arthritis     lumbar spine.  spondylolisthesis, hip and leg pain, scoliosis    Current Outpatient Prescriptions on File Prior to Visit  Medication Sig Dispense Refill  . acetaminophen (TYLENOL 8  HOUR) 650 MG CR tablet Take 650 mg by mouth 2 (two) times daily.      Marland Kitchen aspirin 81 MG tablet Take 81 mg by mouth daily.      . Calcium Citrate-Vitamin D (CITRACAL + D PO) Take by mouth 3 (three) times daily.      . cholecalciferol (VITAMIN D) 1000 UNITS tablet Take 1,000 Units by mouth 2 (two) times daily.      . Coenzyme Q10 (COQ10) 100 MG CAPS Take 1 or 2 times daily as needed      . levothyroxine (SYNTHROID, LEVOTHROID) 88 MCG tablet Take 88 mcg by mouth daily.      . Multiple Vitamin (MULTIVITAMIN) tablet Take 1 tablet by mouth daily.      . Naproxen Sodium (ALEVE) 220 MG CAPS Take by mouth. Take two by mouth once a day      . nitrofurantoin (MACRODANTIN) 100 MG capsule Take 100 mg by mouth daily.      Marland Kitchen omeprazole (PRILOSEC) 20 MG capsule Take 20 mg by mouth daily.      . Potassium Gluconate 595 MG CAPS Take by mouth daily.      . pravastatin (PRAVACHOL) 20 MG tablet Take 20 mg by mouth daily.      Marland Kitchen tolterodine (DETROL LA) 4 MG 24 hr capsule Take 4 mg by mouth daily.      . clotrimazole-betamethasone (LOTRISONE)  cream Apply topically 2 (two) times daily.  30 g  0  . fesoterodine (TOVIAZ) 8 MG TB24 Take 8 mg by mouth daily.      . ranitidine (ZANTAC) 150 MG tablet Take 1 tablet (150 mg total) by mouth daily.  30 tablet  5   No current facility-administered medications on file prior to visit.    Review of Systems Patient denies any headache, lightheadedness or dizziness.  No nasal congestion.  No swallowing problems.  Denies any acid reflux currently.  States under reasonable control now that she is back on the omeprazole.   No chest pain, tightness or palpitations.  No increased shortness of breath, cough or congestion.  Breathing stable.  No nausea or vomiting.  Previous abdominal pain resolved.  No bowel change, such as diarrhea, constipation, BRBPR or melana.  Sees Dr Achilles Dunk for her urinary incontinence.  On Detrol now.  See above.   Found to have RAS.  Being followed by Dr Achilles Dunk and  vascular.  Plans to f/u with Dr Katrinka Blazing regarding her left hip and leg pain.      Objective:   Physical Exam  Filed Vitals:   06/28/12 1008  BP: 120/70  Pulse: 85  Temp: 97.7 F (36.5 C)   Blood pressure recheck:  132/72 right, 88-90/62 left.    79 year old female in no acute distress.   HEENT:  Nares - clear.  OP- without lesions or erythema.  No sinus tenderness to palpation.  NECK:  Supple, nontender.  Right carotid bruit.     HEART:  Appears to be regular. LUNGS:  Without crackles or wheezing audible.  Respirations even and unlabored.   RADIAL PULSE:  Equal bilaterally.  ABDOMEN:  Soft.  Non tender.   No rebound or guarding.     EXTREMITIES:  No increased edema to be present.  No increased redness.   MSK:  No tenderness to palpation over the lower extremities.                 Assessment & Plan:  RUQ PAIN.  Resolved.     LEG CRAMPS.  On pravastatin.  Cramps are better on CoQ10.  Not a significant issue for her now.  Follow.    GU.  States was told she had RAS.  Followed by Dr Achilles Dunk and vascular.       HEALTH MAINTENANCE.  Will need to get her scheduled for a physical.   Will need to keep on track with her mammograms, etc.  Pneumovax 08/28/09. Colonoscopy 06/18/04 - normal.

## 2012-07-02 ENCOUNTER — Encounter: Payer: Self-pay | Admitting: Internal Medicine

## 2012-07-02 NOTE — Assessment & Plan Note (Signed)
On thyroid replacement.  Follow tsh.  

## 2012-07-02 NOTE — Assessment & Plan Note (Signed)
On pravastatin.  Continue for now.  Follow lipid panel and liver function.

## 2012-07-02 NOTE — Assessment & Plan Note (Signed)
Saw Dr Reita Chard.  S/p injection.  Plans to f/u with him next week.

## 2012-07-02 NOTE — Assessment & Plan Note (Signed)
Back on omeprazole now.  Symptoms controlled.  Had immediate return of symptoms when she stopped the omeprazole for a short period of time.  Discussed EGD - especially given that she has never had one and given she had immediate return of symptoms off the PPI.  She declines.  Will follow.  No dysphagia.

## 2012-07-02 NOTE — Assessment & Plan Note (Signed)
Sees Dr Achilles Dunk.  Did not tolerate Toviaz.  On Detrol now and feels she is doing relatively well.  Follow.

## 2012-07-02 NOTE — Assessment & Plan Note (Signed)
Blood pressure as outlined.  Continue to monitor.  Follow metabolic panel.  Varying pressures.  States this is stable.  Follow.  Seeing vascular.

## 2012-07-02 NOTE — Assessment & Plan Note (Signed)
Has tried fosamax, boniva, reclast and Forteo. Two spontaneous femur fractures.  Evaluated by Dr Gavin Potters.  Recommended continuing calcium and vitamin D.  Follow vitamin D level.

## 2012-07-04 DIAGNOSIS — M76899 Other specified enthesopathies of unspecified lower limb, excluding foot: Secondary | ICD-10-CM | POA: Diagnosis not present

## 2012-07-09 ENCOUNTER — Other Ambulatory Visit: Payer: Self-pay | Admitting: Internal Medicine

## 2012-07-18 ENCOUNTER — Other Ambulatory Visit: Payer: Self-pay | Admitting: *Deleted

## 2012-07-18 MED ORDER — LEVOTHYROXINE SODIUM 88 MCG PO TABS: ORAL_TABLET | ORAL | Status: DC

## 2012-08-28 DIAGNOSIS — I6529 Occlusion and stenosis of unspecified carotid artery: Secondary | ICD-10-CM | POA: Diagnosis not present

## 2012-08-28 DIAGNOSIS — I701 Atherosclerosis of renal artery: Secondary | ICD-10-CM | POA: Diagnosis not present

## 2012-08-28 DIAGNOSIS — M79609 Pain in unspecified limb: Secondary | ICD-10-CM | POA: Diagnosis not present

## 2012-09-12 ENCOUNTER — Other Ambulatory Visit (INDEPENDENT_AMBULATORY_CARE_PROVIDER_SITE_OTHER): Payer: Medicare Other

## 2012-09-12 DIAGNOSIS — I10 Essential (primary) hypertension: Secondary | ICD-10-CM

## 2012-09-12 DIAGNOSIS — E039 Hypothyroidism, unspecified: Secondary | ICD-10-CM

## 2012-09-12 DIAGNOSIS — E7800 Pure hypercholesterolemia, unspecified: Secondary | ICD-10-CM

## 2012-09-12 LAB — HEPATIC FUNCTION PANEL
AST: 21 U/L (ref 0–37)
Alkaline Phosphatase: 44 U/L (ref 39–117)
Bilirubin, Direct: 0.1 mg/dL (ref 0.0–0.3)
Total Bilirubin: 0.8 mg/dL (ref 0.3–1.2)

## 2012-09-12 LAB — BASIC METABOLIC PANEL
BUN: 21 mg/dL (ref 6–23)
CO2: 28 mEq/L (ref 19–32)
Chloride: 109 mEq/L (ref 96–112)
Creatinine, Ser: 1 mg/dL (ref 0.4–1.2)

## 2012-09-12 LAB — LIPID PANEL: Total CHOL/HDL Ratio: 5

## 2012-09-14 ENCOUNTER — Other Ambulatory Visit: Payer: Medicare Other

## 2012-09-17 ENCOUNTER — Other Ambulatory Visit: Payer: Self-pay | Admitting: Internal Medicine

## 2012-09-19 ENCOUNTER — Encounter: Payer: Self-pay | Admitting: Internal Medicine

## 2012-09-19 ENCOUNTER — Ambulatory Visit (INDEPENDENT_AMBULATORY_CARE_PROVIDER_SITE_OTHER): Payer: Medicare Other | Admitting: Internal Medicine

## 2012-09-19 VITALS — BP 120/80 | HR 82 | Temp 97.7°F | Ht 59.0 in | Wt 145.5 lb

## 2012-09-19 DIAGNOSIS — E7800 Pure hypercholesterolemia, unspecified: Secondary | ICD-10-CM

## 2012-09-19 DIAGNOSIS — M1990 Unspecified osteoarthritis, unspecified site: Secondary | ICD-10-CM

## 2012-09-19 DIAGNOSIS — K219 Gastro-esophageal reflux disease without esophagitis: Secondary | ICD-10-CM

## 2012-09-19 DIAGNOSIS — R32 Unspecified urinary incontinence: Secondary | ICD-10-CM

## 2012-09-19 DIAGNOSIS — I10 Essential (primary) hypertension: Secondary | ICD-10-CM | POA: Diagnosis not present

## 2012-09-19 DIAGNOSIS — E039 Hypothyroidism, unspecified: Secondary | ICD-10-CM

## 2012-09-19 DIAGNOSIS — M810 Age-related osteoporosis without current pathological fracture: Secondary | ICD-10-CM

## 2012-09-21 ENCOUNTER — Encounter: Payer: Self-pay | Admitting: Internal Medicine

## 2012-09-21 NOTE — Assessment & Plan Note (Signed)
Saw Dr Reita Chard.  S/p two injections.  Better.

## 2012-09-21 NOTE — Progress Notes (Signed)
Subjective:    Patient ID: Julia Patterson, female    DOB: 09/15/1929, 79 y.o.   MRN: [id number]  HPI 79 year old female with past history of hypercholesterolemia, hypothyroidism, osteoporosis s/p multiple bisphosphonates and Forteo with spontaneous femur fractures.  She also sees Dr Achilles Dunk for her urinary incontinence.  She comes in today for a scheduled follow up.  She has had two femur fractures.  Second occurred after treatment with Forteo.  Has seen Dr Gavin Potters.  Per pt report - no further medication warranted for the osteoporosis.  Instructed to continue calcium and vitamin D.  No chest pain or tightness.  Breathing stable.  She had noticed some left breast tenderness.  Mammogram negative.  This has resolved.  Previously had some leg cramps.  She had previously started CoQ10 with noted improvement.  Not a significant issue for her now.  Eating and drinking well. On Prilosec.  No significant acid reflux now.  No dysphagia.  She reports that she did not tolerate the Toviaz.  Is back on Detrol now.  States this is working relatively well.  Tolerating.  Is seeing Dr Achilles Dunk.  Was told she had RAS.  Has had ultrasound testing and has seen vascular.  Reports plan is to f/u in six months.  3-4 weeks ago, started having some left hip and leg pain.  Saw Dr Reita Chard.  S/p two injections.  Doing better.  Told she had arthritis.  She did fall in mid July.  Injured her right knee.  Still some pain, but denies any further w/up.       Past Medical History  Diagnosis Date  . GERD (gastroesophageal reflux disease)   . Hypothyroidism   . Hypercholesterolemia   . Urinary incontinence   . Osteoporosis     s/p fosamax, boniva, reclast, forteo.  bilateral spontaneous femur fractures  . Recurrent cystitis   . Varicose veins     chronic venous insufficiency  . Degenerative arthritis     lumbar spine.  spondylolisthesis, hip and leg pain, scoliosis    Current Outpatient Prescriptions on File Prior to Visit   Medication Sig Dispense Refill  . acetaminophen (TYLENOL 8 HOUR) 650 MG CR tablet Take 650 mg by mouth 2 (two) times daily.      Marland Kitchen aspirin 81 MG tablet Take 81 mg by mouth daily.      . Calcium Citrate-Vitamin D (CITRACAL + D PO) Take by mouth 3 (three) times daily.      . cholecalciferol (VITAMIN D) 1000 UNITS tablet Take 1,000 Units by mouth 2 (two) times daily.      . Coenzyme Q10 (COQ10) 100 MG CAPS Take 1 or 2 times daily as needed      . levothyroxine (SYNTHROID, LEVOTHROID) 88 MCG tablet TAKE 1 TABLET DAILY  90 tablet  1  . Multiple Vitamin (MULTIVITAMIN) tablet Take 1 tablet by mouth daily.      . Naproxen Sodium (ALEVE) 220 MG CAPS Take by mouth. Take two by mouth once a day      . nitrofurantoin (MACRODANTIN) 100 MG capsule Take 100 mg by mouth daily.      Marland Kitchen omeprazole (PRILOSEC) 20 MG capsule Take 20 mg by mouth daily.      . Potassium Gluconate 595 MG CAPS Take by mouth daily.      . pravastatin (PRAVACHOL) 20 MG tablet TAKE 1 TABLET BY MOUTH EVERY DAY  90 tablet  1  . tolterodine (DETROL LA) 4 MG 24 hr capsule  Take 4 mg by mouth daily.      . ranitidine (ZANTAC) 150 MG tablet Take 1 tablet (150 mg total) by mouth daily.  30 tablet  5   No current facility-administered medications on file prior to visit.    Review of Systems Patient denies any headache, lightheadedness or dizziness.  No nasal congestion.  No swallowing problems.  Denies any acid reflux currently.  Under control on omeprazole.   No palpitations.  some intermittent chest pain.  Last only for a brief period.  Not necessarily brought on by exertion.  No increased shortness of breath, cough or congestion.  Breathing stable.  No nausea or vomiting.  Previous abdominal pain resolved.  No bowel change, such as diarrhea, constipation, BRBPR or melana.  Sees Dr Achilles Dunk for her urinary incontinence.  On Detrol now.  See above.   Found to have RAS.  Being followed by Dr Achilles Dunk and vascular.  Hip pain better since two injections.   Still with some knee pain s/p fall.  Some better.  Desires no further intervention.       Objective:   Physical Exam  Filed Vitals:   09/19/12 1005  BP: 120/80  Pulse: 82  Temp: 97.7 F (71.31 C)   79 year old female in no acute distress.   HEENT:  Nares- clear.  Oropharynx - without lesions. NECK:  Supple.  Nontender.  No audible bruit.  HEART:  Appears to be regular. LUNGS:  No crackles or wheezing audible.  Respirations even and unlabored.  RADIAL PULSE:  Equal bilaterally.    BREASTS:  She declined.   ABDOMEN:  Soft, nontender.  Bowel sounds present and normal.  No audible abdominal bruit.  GU:  She declined.     EXTREMITIES:  No increased edema present.  DP pulses palpable and equal bilaterally.           Assessment & Plan:  CARDIOVASCULAR.  She did report some intermittent chest pain.  Not brought on by exertion.  Varies.  Discussed further w/up including EKG and other cardiac testing.  She declines.  Wants to monitor.    RUQ PAIN.  Resolved.     LEG CRAMPS.  On pravastatin.  Cramps are better on CoQ10.  Not a significant issue for her now.  Follow.    GU.  States was told she had RAS.  Followed by Dr Achilles Dunk and vascular.       HEALTH MAINTENANCE.  Physical today.  Pneumovax 08/28/09. Colonoscopy 06/18/04 - normal.  Left mammogram 05/10/12 - Birads II.  Discussed with her the need for f/u bilateral mammogram.

## 2012-09-21 NOTE — Assessment & Plan Note (Signed)
On thyroid replacement.  Follow tsh.  

## 2012-09-21 NOTE — Assessment & Plan Note (Signed)
Blood pressure as outlined.  Continue to monitor.  Follow metabolic panel.  Seeing vascular.   

## 2012-09-21 NOTE — Assessment & Plan Note (Signed)
On pravastatin.  Continue for now.  Follow lipid panel and liver function.

## 2012-09-21 NOTE — Assessment & Plan Note (Signed)
Has tried fosamax, boniva, reclast and Forteo. Two spontaneous femur fractures.  Evaluated by Dr Gavin Potters.  Recommended continuing calcium and vitamin D.  Follow vitamin D level.

## 2012-09-21 NOTE — Assessment & Plan Note (Signed)
Back on omeprazole now.  Symptoms controlled.  Had immediate return of symptoms when she stopped the omeprazole for a short period of time.  Have discussed EGD - especially given that she has never had one and given she had immediate return of symptoms off the PPI.  She declines.  Will follow.  No dysphagia.   

## 2012-09-21 NOTE — Assessment & Plan Note (Signed)
Sees Dr Achilles Dunk.  Did not tolerate Toviaz.  On Detrol now and feels she is doing relatively well.  Follow.

## 2012-10-26 DIAGNOSIS — Z23 Encounter for immunization: Secondary | ICD-10-CM | POA: Diagnosis not present

## 2012-11-14 ENCOUNTER — Ambulatory Visit: Payer: Self-pay | Admitting: Urology

## 2012-11-14 DIAGNOSIS — N3020 Other chronic cystitis without hematuria: Secondary | ICD-10-CM | POA: Diagnosis not present

## 2012-11-14 DIAGNOSIS — N200 Calculus of kidney: Secondary | ICD-10-CM | POA: Diagnosis not present

## 2012-11-14 DIAGNOSIS — R339 Retention of urine, unspecified: Secondary | ICD-10-CM | POA: Diagnosis not present

## 2012-11-14 DIAGNOSIS — N3946 Mixed incontinence: Secondary | ICD-10-CM | POA: Diagnosis not present

## 2012-11-20 ENCOUNTER — Ambulatory Visit (INDEPENDENT_AMBULATORY_CARE_PROVIDER_SITE_OTHER): Payer: Medicare Other | Admitting: Internal Medicine

## 2012-11-20 ENCOUNTER — Encounter: Payer: Self-pay | Admitting: Internal Medicine

## 2012-11-20 VITALS — BP 144/80 | HR 74 | Temp 98.4°F | Ht 59.0 in | Wt 142.0 lb

## 2012-11-20 DIAGNOSIS — E039 Hypothyroidism, unspecified: Secondary | ICD-10-CM | POA: Diagnosis not present

## 2012-11-20 DIAGNOSIS — M1990 Unspecified osteoarthritis, unspecified site: Secondary | ICD-10-CM

## 2012-11-20 DIAGNOSIS — Z111 Encounter for screening for respiratory tuberculosis: Secondary | ICD-10-CM

## 2012-11-20 DIAGNOSIS — R32 Unspecified urinary incontinence: Secondary | ICD-10-CM

## 2012-11-20 DIAGNOSIS — E7800 Pure hypercholesterolemia, unspecified: Secondary | ICD-10-CM

## 2012-11-20 DIAGNOSIS — I10 Essential (primary) hypertension: Secondary | ICD-10-CM

## 2012-11-20 DIAGNOSIS — M810 Age-related osteoporosis without current pathological fracture: Secondary | ICD-10-CM | POA: Diagnosis not present

## 2012-11-20 DIAGNOSIS — K219 Gastro-esophageal reflux disease without esophagitis: Secondary | ICD-10-CM

## 2012-11-23 ENCOUNTER — Other Ambulatory Visit: Payer: Self-pay | Admitting: Internal Medicine

## 2012-11-25 ENCOUNTER — Encounter: Payer: Self-pay | Admitting: Internal Medicine

## 2012-11-25 NOTE — Assessment & Plan Note (Signed)
Blood pressure as outlined.  Continue to monitor.  Follow metabolic panel.  Seeing vascular.   

## 2012-11-25 NOTE — Assessment & Plan Note (Signed)
Has tried fosamax, boniva, reclast and Forteo. Two spontaneous femur fractures.  Evaluated by Dr Gavin Potters.  Recommended continuing calcium and vitamin D.  Follow vitamin D level.

## 2012-11-25 NOTE — Assessment & Plan Note (Signed)
Sees Dr Achilles Dunk.  Did not tolerate Toviaz.  On Detrol now and feels she is doing relatively well.  Follow.

## 2012-11-25 NOTE — Progress Notes (Signed)
Subjective:    Patient ID: Julia Patterson, female    DOB: October 24, 1929, 79 y.o.   MRN: [id number]  HPI 79 year old female with past history of hypercholesterolemia, hypothyroidism, osteoporosis s/p multiple bisphosphonates and Forteo with spontaneous femur fractures.  She also sees Dr Achilles Dunk for her urinary incontinence.  She comes in today for a scheduled follow up and for form completion.  She has had two femur fractures.  Second occurred after treatment with Forteo.  Has seen Dr Gavin Potters.  Per pt report - no further medication warranted for the osteoporosis.  Instructed to continue calcium and vitamin D.  No chest pain or tightness.  Breathing stable.  She had noticed some left breast tenderness.  Mammogram negative.  This has resolved.  Previously had some leg cramps.  She had previously started CoQ10 with noted improvement.  Not a significant issue for her now.  Eating and drinking well.  On Prilosec.  No significant acid reflux now.  No dysphagia.  She reports that she did not tolerate the Toviaz.  Is back on Detrol now.  States this is working relatively well.  Tolerating.  Is seeing Dr Achilles Dunk.  Was told she had RAS.  Has had ultrasound testing and has seen vascular.  Reports plan is to f/u.  Is now living at [facility].  Needed a form completed.  Overall doing well.        Past Medical History  Diagnosis Date  . GERD (gastroesophageal reflux disease)   . Hypothyroidism   . Hypercholesterolemia   . Urinary incontinence   . Osteoporosis     s/p fosamax, boniva, reclast, forteo.  bilateral spontaneous femur fractures  . Recurrent cystitis   . Varicose veins     chronic venous insufficiency  . Degenerative arthritis     lumbar spine.  spondylolisthesis, hip and leg pain, scoliosis    Current Outpatient Prescriptions on File Prior to Visit  Medication Sig Dispense Refill  . acetaminophen (TYLENOL 8 HOUR) 650 MG CR tablet Take 650 mg by mouth 2 (two) times daily.      Marland Kitchen aspirin  81 MG tablet Take 81 mg by mouth daily.      . Calcium Citrate-Vitamin D (CITRACAL + D PO) Take by mouth 3 (three) times daily.      . cholecalciferol (VITAMIN D) 1000 UNITS tablet Take 1,000 Units by mouth 2 (two) times daily.      . Coenzyme Q10 (COQ10) 100 MG CAPS Take 1 or 2 times daily as needed      . levothyroxine (SYNTHROID, LEVOTHROID) 88 MCG tablet TAKE 1 TABLET DAILY  90 tablet  1  . Multiple Vitamin (MULTIVITAMIN) tablet Take 1 tablet by mouth daily.      . Naproxen Sodium (ALEVE) 220 MG CAPS Take by mouth. Take two by mouth once a day      . Potassium Gluconate 595 MG CAPS Take by mouth daily.      . pravastatin (PRAVACHOL) 20 MG tablet TAKE 1 TABLET BY MOUTH EVERY DAY  90 tablet  1  . ranitidine (ZANTAC) 150 MG tablet Take 1 tablet (150 mg total) by mouth daily.  30 tablet  5  . tolterodine (DETROL LA) 4 MG 24 hr capsule Take 4 mg by mouth daily.      . nitrofurantoin (MACRODANTIN) 100 MG capsule Take 100 mg by mouth daily.       No current facility-administered medications on file prior to visit.    Review of  Systems Patient denies any headache, lightheadedness or dizziness.  No nasal congestion.  No swallowing problems.  Denies any acid reflux currently.  Under control on omeprazole.   No palpitations.  No significant chest pain.  No increased shortness of breath, cough or congestion.  Breathing stable.  No nausea or vomiting.  Previous abdominal pain resolved.  No bowel change, such as diarrhea, constipation, BRBPR or melana.  Sees Dr Achilles Dunk for her urinary incontinence.  On Detrol now.  See above.   Found to have RAS.  Being followed by Dr Achilles Dunk and vascular.  Hip pain better since two injections.  Overall she feels she is doing well.  Living at [facility] now.         Objective:   Physical Exam  Filed Vitals:   11/20/12 1203  BP: 144/80  Pulse: 74  Temp: 98.4 F (60.77 C)   79 year old female in no acute distress.   HEENT:  Nares- clear.  Oropharynx - without  lesions. NECK:  Supple.  Nontender.  No audible bruit.  HEART:  Appears to be regular. LUNGS:  No crackles or wheezing audible.  Respirations even and unlabored.  RADIAL PULSE:  Equal bilaterally.    ABDOMEN:  Soft, nontender.  Bowel sounds present and normal.  No audible abdominal bruit.    EXTREMITIES:  No increased edema present.  DP pulses palpable and equal bilaterally.           Assessment & Plan:  CARDIOVASCULAR. Currently doing well.  Follow.    RUQ PAIN.  Resolved.     LEG CRAMPS.  On pravastatin.  Cramps are better on CoQ10.  Not a significant issue for her now.  Follow.    GU.  States was told she had RAS.  Followed by Dr Achilles Dunk and vascular.  FORM COMPLETION.  Form to be completed for [facility]  Needs TB skin test placed.        HEALTH MAINTENANCE.  Physical 09/19/12.  Pneumovax 08/28/09. Colonoscopy 06/18/04 - normal.  Left mammogram 05/10/12 - Birads II.  Discussed with her the need for f/u bilateral mammogram.

## 2012-11-25 NOTE — Assessment & Plan Note (Signed)
Saw Dr Reita Chard.  S/p two injections.  Better.

## 2012-11-25 NOTE — Assessment & Plan Note (Signed)
On thyroid replacement.  Follow tsh.  

## 2012-11-25 NOTE — Assessment & Plan Note (Signed)
Back on omeprazole now.  Symptoms controlled.  Had immediate return of symptoms when she stopped the omeprazole for a short period of time.  Have discussed EGD - especially given that she has never had one and given she had immediate return of symptoms off the PPI.  She declines.  Will follow.  No dysphagia.   

## 2012-11-25 NOTE — Assessment & Plan Note (Signed)
On pravastatin.  Continue for now.  Follow lipid panel and liver function.

## 2012-12-12 DIAGNOSIS — I701 Atherosclerosis of renal artery: Secondary | ICD-10-CM | POA: Diagnosis not present

## 2012-12-12 DIAGNOSIS — E785 Hyperlipidemia, unspecified: Secondary | ICD-10-CM | POA: Diagnosis not present

## 2012-12-12 DIAGNOSIS — I8390 Asymptomatic varicose veins of unspecified lower extremity: Secondary | ICD-10-CM | POA: Diagnosis not present

## 2012-12-12 DIAGNOSIS — I6529 Occlusion and stenosis of unspecified carotid artery: Secondary | ICD-10-CM | POA: Diagnosis not present

## 2013-01-15 DIAGNOSIS — H905 Unspecified sensorineural hearing loss: Secondary | ICD-10-CM | POA: Diagnosis not present

## 2013-01-15 DIAGNOSIS — H908 Mixed conductive and sensorineural hearing loss, unspecified: Secondary | ICD-10-CM | POA: Diagnosis not present

## 2013-01-25 ENCOUNTER — Other Ambulatory Visit (INDEPENDENT_AMBULATORY_CARE_PROVIDER_SITE_OTHER): Payer: Medicare Other

## 2013-01-25 DIAGNOSIS — M810 Age-related osteoporosis without current pathological fracture: Secondary | ICD-10-CM

## 2013-01-25 DIAGNOSIS — E7800 Pure hypercholesterolemia, unspecified: Secondary | ICD-10-CM

## 2013-01-25 DIAGNOSIS — I10 Essential (primary) hypertension: Secondary | ICD-10-CM | POA: Diagnosis not present

## 2013-01-25 LAB — BASIC METABOLIC PANEL
BUN: 22 mg/dL (ref 6–23)
CO2: 27 mEq/L (ref 19–32)
Chloride: 110 mEq/L (ref 96–112)
Creatinine, Ser: 1 mg/dL (ref 0.4–1.2)
Sodium: 143 mEq/L (ref 135–145)

## 2013-01-25 LAB — HEPATIC FUNCTION PANEL
ALT: 20 U/L (ref 0–35)
AST: 25 U/L (ref 0–37)
Alkaline Phosphatase: 43 U/L (ref 39–117)
Total Protein: 7.2 g/dL (ref 6.0–8.3)

## 2013-01-25 LAB — LIPID PANEL
Cholesterol: 178 mg/dL (ref 0–200)
LDL Cholesterol: 104 mg/dL — ABNORMAL HIGH (ref 0–99)
Total CHOL/HDL Ratio: 4
Triglycerides: 156 mg/dL — ABNORMAL HIGH (ref 0.0–149.0)

## 2013-01-30 ENCOUNTER — Encounter: Payer: Self-pay | Admitting: Internal Medicine

## 2013-01-30 ENCOUNTER — Ambulatory Visit (INDEPENDENT_AMBULATORY_CARE_PROVIDER_SITE_OTHER): Payer: Medicare Other | Admitting: Internal Medicine

## 2013-01-30 ENCOUNTER — Other Ambulatory Visit: Payer: Self-pay | Admitting: Internal Medicine

## 2013-01-30 VITALS — BP 130/70 | HR 82 | Temp 97.7°F | Ht 59.0 in | Wt 146.5 lb

## 2013-01-30 DIAGNOSIS — E039 Hypothyroidism, unspecified: Secondary | ICD-10-CM | POA: Diagnosis not present

## 2013-01-30 DIAGNOSIS — Z1239 Encounter for other screening for malignant neoplasm of breast: Secondary | ICD-10-CM

## 2013-01-30 DIAGNOSIS — M1990 Unspecified osteoarthritis, unspecified site: Secondary | ICD-10-CM

## 2013-01-30 DIAGNOSIS — R32 Unspecified urinary incontinence: Secondary | ICD-10-CM | POA: Diagnosis not present

## 2013-01-30 DIAGNOSIS — E7800 Pure hypercholesterolemia, unspecified: Secondary | ICD-10-CM

## 2013-01-30 DIAGNOSIS — M810 Age-related osteoporosis without current pathological fracture: Secondary | ICD-10-CM

## 2013-01-30 DIAGNOSIS — K219 Gastro-esophageal reflux disease without esophagitis: Secondary | ICD-10-CM

## 2013-01-30 DIAGNOSIS — I10 Essential (primary) hypertension: Secondary | ICD-10-CM

## 2013-01-30 NOTE — Progress Notes (Signed)
Pre-visit discussion using our clinic review tool. No additional management support is needed unless otherwise documented below in the visit note.  

## 2013-01-31 DIAGNOSIS — M76899 Other specified enthesopathies of unspecified lower limb, excluding foot: Secondary | ICD-10-CM | POA: Diagnosis not present

## 2013-02-03 ENCOUNTER — Encounter: Payer: Self-pay | Admitting: Internal Medicine

## 2013-02-03 NOTE — Assessment & Plan Note (Signed)
On pravastatin.  Follow lipid panel and liver function.   

## 2013-02-03 NOTE — Assessment & Plan Note (Signed)
Saw Dr Reita Chard.  S/p two injections.  Still with left hip pain.  Planning to see Dr Reita Chard tomorrow.  Follow.

## 2013-02-03 NOTE — Assessment & Plan Note (Signed)
Back on omeprazole now.  Symptoms controlled.  Had immediate return of symptoms when she stopped the omeprazole for a short period of time.  Have discussed EGD - especially given that she has never had one and given she had immediate return of symptoms off the PPI.  She declines.  Will follow.  No dysphagia.   

## 2013-02-03 NOTE — Assessment & Plan Note (Signed)
Sees Dr Achilles Dunk.  Did not tolerate Toviaz.  On Detrol now and feels she is doing relatively well.  Follow.  Just evaluated by Dr Achilles Dunk.  KUB.  Stable.

## 2013-02-03 NOTE — Progress Notes (Signed)
Subjective:    Patient ID: Julia Patterson, female    DOB: [birth year], 79 y.o.   MRN: [id number]  HPI 79 year old female with past history of hypercholesterolemia, hypothyroidism, osteoporosis s/p multiple bisphosphonates and Forteo with spontaneous femur fractures.  She also sees Dr Achilles Dunk for her urinary incontinence.  She comes in today for a scheduled follow up.  She has had two femur fractures.  Second occurred after treatment with Forteo.  Has seen Dr Gavin Potters.  Per pt report - no further medication warranted for the osteoporosis.  Instructed to continue calcium and vitamin D.  No chest pain or tightness.  Breathing stable.  She had noticed some left breast tenderness.  Mammogram negative.  This has resolved.  Previously had some leg cramps.  She had previously started CoQ10 with noted improvement.  Not a significant issue for her now.  Eating and drinking well.  On Prilosec.  No significant acid reflux now.  No dysphagia.  She reports that she did not tolerate the Toviaz.  Is back on Detrol now.  States this is working relatively well.  Tolerating.  Is seeing Dr Achilles Dunk.  KUB obtained last check - stable.  Was told she had RAS.  Has had ultrasound testing and has seen vascular.  Last seen 12/13/12.  Stable.  Is now living at [facility].  Having some left lateral hip discomfort.  Has been told had arthritis.  Seeing Dr Reita Chard tomorrow.  Can't stand a long time.  Legs get tired.  Overall she feels things are relatively stable.         Past Medical History  Diagnosis Date  . GERD (gastroesophageal reflux disease)   . Hypothyroidism   . Hypercholesterolemia   . Urinary incontinence   . Osteoporosis     s/p fosamax, boniva, reclast, forteo.  bilateral spontaneous femur fractures  . Recurrent cystitis   . Varicose veins     chronic venous insufficiency  . Degenerative arthritis     lumbar spine.  spondylolisthesis, hip and leg pain, scoliosis    Current Outpatient Prescriptions  on File Prior to Visit  Medication Sig Dispense Refill  . acetaminophen (TYLENOL 8 HOUR) 650 MG CR tablet Take 650 mg by mouth 2 (two) times daily.      Marland Kitchen aspirin 81 MG tablet Take 81 mg by mouth daily.      . Calcium Citrate-Vitamin D (CITRACAL + D PO) Take by mouth 3 (three) times daily.      . cholecalciferol (VITAMIN D) 1000 UNITS tablet Take 1,000 Units by mouth 2 (two) times daily.      . Coenzyme Q10 (COQ10) 100 MG CAPS Take 1 or 2 times daily as needed      . Multiple Vitamin (MULTIVITAMIN) tablet Take 1 tablet by mouth daily.      . Naproxen Sodium (ALEVE) 220 MG CAPS Take by mouth. Take two by mouth once a day      . omeprazole (PRILOSEC) 20 MG capsule TAKE ONE CAPSULE EVERY DAY  90 capsule  1  . Potassium Gluconate 595 MG CAPS Take by mouth daily.      . pravastatin (PRAVACHOL) 20 MG tablet TAKE 1 TABLET BY MOUTH EVERY DAY  90 tablet  1  . tolterodine (DETROL LA) 4 MG 24 hr capsule Take 4 mg by mouth daily.       No current facility-administered medications on file prior to visit.    Review of Systems Patient denies any headache, lightheadedness  or dizziness.  No nasal congestion.  No swallowing problems.  Denies any acid reflux currently.  Under control on omeprazole.   No palpitations.  No chest pain.  No increased shortness of breath, cough or congestion.  Breathing stable.  No nausea or vomiting.  Previous abdominal pain resolved.  No bowel change, such as diarrhea, constipation, BRBPR or melana.  Sees Dr Achilles Dunk for her urinary incontinence.  On Detrol now.  See above.   Found to have RAS.  Being followed by Dr Achilles Dunk and vascular.  Has the hip pain as outlined.  Overall she feels she is doing well.  Living at [facility] now.         Objective:   Physical Exam  Filed Vitals:   01/30/13 0909  BP: 130/70  Pulse: 82  Temp: 97.7 F (36.5 C)   Blood pressure recheck:  136/80, pulse 17  79 year old female in no acute distress.   HEENT:  Nares- clear.  Oropharynx - without  lesions. NECK:  Supple.  Nontender.  No audible bruit.  HEART:  Appears to be regular. LUNGS:  No crackles or wheezing audible.  Respirations even and unlabored.  RADIAL PULSE:  Equal bilaterally.    ABDOMEN:  Soft, nontender.  Bowel sounds present and normal.  No audible abdominal bruit.    EXTREMITIES:  No increased edema present.  DP pulses palpable and equal bilaterally.           Assessment & Plan:  CARDIOVASCULAR.  Currently doing well.  Follow.    RUQ PAIN.  Resolved.     LEG CRAMPS.  On pravastatin.  Cramps are better on CoQ10.  Not a significant issue for her now.  Follow.    GU.  States was told she had RAS.  Followed by Dr Achilles Dunk and vascular.  Just evaluated.  Stable.   HEALTH MAINTENANCE.  Physical 09/19/12.  Pneumovax 08/28/09. Colonoscopy 06/18/04 - normal.  Left mammogram 05/10/12 - Birads II.  Discussed with her the need for f/u bilateral mammogram.  Schedule.

## 2013-02-03 NOTE — Assessment & Plan Note (Signed)
On thyroid replacement.  Follow tsh.  

## 2013-02-03 NOTE — Assessment & Plan Note (Signed)
Blood pressure as outlined.  Continue to monitor.  Follow metabolic panel.  Seeing vascular.   

## 2013-02-03 NOTE — Assessment & Plan Note (Addendum)
Has tried fosamax, boniva, reclast and Forteo. Two spontaneous femur fractures.  Evaluated by Dr [facility]ern[facility]le.  Recommended continuing vitamin D.  Follow vitamin D level.  Vitamin D level 01/25/13 - 58.   

## 2013-02-15 ENCOUNTER — Ambulatory Visit: Payer: Self-pay | Admitting: Internal Medicine

## 2013-02-15 DIAGNOSIS — Z1231 Encounter for screening mammogram for malignant neoplasm of breast: Secondary | ICD-10-CM | POA: Diagnosis not present

## 2013-02-15 LAB — HM MAMMOGRAPHY: HM MAMMO: NEGATIVE

## 2013-02-18 ENCOUNTER — Encounter: Payer: Self-pay | Admitting: Internal Medicine

## 2013-02-20 ENCOUNTER — Encounter: Payer: Self-pay | Admitting: Adult Health

## 2013-02-20 ENCOUNTER — Ambulatory Visit (INDEPENDENT_AMBULATORY_CARE_PROVIDER_SITE_OTHER): Payer: Medicare Other | Admitting: Adult Health

## 2013-02-20 VITALS — BP 126/68 | HR 89 | Temp 97.6°F | Resp 12 | Ht 59.0 in | Wt 143.0 lb

## 2013-02-20 DIAGNOSIS — J329 Chronic sinusitis, unspecified: Secondary | ICD-10-CM | POA: Diagnosis not present

## 2013-02-20 DIAGNOSIS — R1012 Left upper quadrant pain: Secondary | ICD-10-CM | POA: Insufficient documentation

## 2013-02-20 MED ORDER — FLUTICASONE PROPIONATE 50 MCG/ACT NA SUSP: 2.0000 | Freq: Every day | NASAL | Status: DC

## 2013-02-20 NOTE — Progress Notes (Signed)
   Subjective:    Patient ID: Julia Patterson, female    DOB: September 03, 1929, 80 y.o.   MRN: [id number]  HPI  Pt is a pleasant 80 y/o female who presents with upper respiratory symptoms of sinus drainage, post nasal drip, sinus headache, cough. Symptoms started on Saturday. She reports no fever, chills, wheezing or shortness of breath. She is actually feeling better. Was concerned because she noted her secretions from her nose were slightly yellow.   Current Outpatient Prescriptions on File Prior to Visit  Medication Sig Dispense Refill  . acetaminophen (TYLENOL 8 HOUR) 650 MG CR tablet Take 650 mg by mouth 2 (two) times daily.      Marland Kitchen aspirin 81 MG tablet Take 81 mg by mouth daily.      . Calcium Citrate-Vitamin D (CITRACAL + D PO) Take by mouth 3 (three) times daily.      . cholecalciferol (VITAMIN D) 1000 UNITS tablet Take 1,000 Units by mouth 2 (two) times daily.      . Coenzyme Q10 (COQ10) 100 MG CAPS Take 1 or 2 times daily as needed      . levothyroxine (SYNTHROID, LEVOTHROID) 88 MCG tablet TAKE 1 TABLET DAILY  90 tablet  2  . Multiple Vitamin (MULTIVITAMIN) tablet Take 1 tablet by mouth daily.      . Naproxen Sodium (ALEVE) 220 MG CAPS Take by mouth. Take two by mouth once a day      . omeprazole (PRILOSEC) 20 MG capsule TAKE ONE CAPSULE EVERY DAY  90 capsule  1  . Potassium Gluconate 595 MG CAPS Take by mouth daily.      . pravastatin (PRAVACHOL) 20 MG tablet TAKE 1 TABLET BY MOUTH EVERY DAY  90 tablet  1  . tolterodine (DETROL LA) 4 MG 24 hr capsule Take 4 mg by mouth daily.       No current facility-administered medications on file prior to visit.     Review of Systems  Constitutional: Negative for fever and chills.  HENT: Positive for congestion, postnasal drip, rhinorrhea and sinus pressure. Negative for sore throat.   Respiratory: Positive for cough.        Objective:   Physical Exam  Constitutional: She is oriented to person, place, and time. She appears  well-developed and well-nourished. No distress.  HENT:  Head: Normocephalic and atraumatic.  Right Ear: External ear normal.  Left Ear: External ear normal.  Mouth/Throat: Oropharynx is clear and moist.  Eyes: Conjunctivae are normal. Pupils are equal, round, and reactive to light.  Cardiovascular: Normal rate, regular rhythm, normal heart sounds and intact distal pulses.  Exam reveals no gallop.   No murmur heard. Pulmonary/Chest: Effort normal and breath sounds normal. No respiratory distress. She has no wheezes. She has no rales.  Musculoskeletal: Normal range of motion.  Lymphadenopathy:    She has no cervical adenopathy.  Neurological: She is alert and oriented to person, place, and time.  Skin: Skin is warm and dry.  Psychiatric: She has a normal mood and affect. Her behavior is normal. Judgment and thought content normal.    BP 126/68  Pulse 89  Temp(Src) 97.6 F (36.4 C) (Oral)  Resp 12  Ht 4\' 11"  (1.499 m)  Wt 143 lb (64.864 kg)  BMI 28.87 kg/m2  SpO2 95%        Assessment & Plan:

## 2013-02-20 NOTE — Assessment & Plan Note (Addendum)
Flonase nasal spray 2 sprays into each nostril daily. Saline spray as often as desired. Over-the-counter cough medication containing dextromethorphan. Return to clinic if symptoms are not improved within 4-5 days. Instructed patient to call if she develops a fever greater than 101 or if her secretions become green

## 2013-02-20 NOTE — Patient Instructions (Signed)
  Take an over the counter medication that contains dextromethorphan (DM).  You can use saline spray to keep your sinuses moist and from drying out too much.  Use flonase nasal spray 2 sprays into each nostril daily.  If your symptoms are not improved within 4-5 days please let us know.  Call if you develop a fever of 101 or greater or if your secretions become green colored.

## 2013-02-20 NOTE — Progress Notes (Signed)
Pre visit review using our clinic review tool, if applicable. No additional management support is needed unless otherwise documented below in the visit note. 

## 2013-03-04 DIAGNOSIS — M76899 Other specified enthesopathies of unspecified lower limb, excluding foot: Secondary | ICD-10-CM | POA: Diagnosis not present

## 2013-03-13 ENCOUNTER — Other Ambulatory Visit: Payer: Self-pay | Admitting: Internal Medicine

## 2013-03-26 DIAGNOSIS — I6529 Occlusion and stenosis of unspecified carotid artery: Secondary | ICD-10-CM | POA: Diagnosis not present

## 2013-05-16 ENCOUNTER — Other Ambulatory Visit: Payer: Self-pay | Admitting: Internal Medicine

## 2013-06-05 ENCOUNTER — Ambulatory Visit (INDEPENDENT_AMBULATORY_CARE_PROVIDER_SITE_OTHER): Payer: Medicare Other | Admitting: Internal Medicine

## 2013-06-05 ENCOUNTER — Encounter: Payer: Self-pay | Admitting: Internal Medicine

## 2013-06-05 ENCOUNTER — Encounter (INDEPENDENT_AMBULATORY_CARE_PROVIDER_SITE_OTHER): Payer: Self-pay

## 2013-06-05 VITALS — BP 124/68 | HR 81 | Temp 97.9°F | Resp 14 | Wt 137.0 lb

## 2013-06-05 DIAGNOSIS — M810 Age-related osteoporosis without current pathological fracture: Secondary | ICD-10-CM

## 2013-06-05 DIAGNOSIS — M1990 Unspecified osteoarthritis, unspecified site: Secondary | ICD-10-CM

## 2013-06-05 DIAGNOSIS — E039 Hypothyroidism, unspecified: Secondary | ICD-10-CM

## 2013-06-05 DIAGNOSIS — E7800 Pure hypercholesterolemia, unspecified: Secondary | ICD-10-CM

## 2013-06-05 DIAGNOSIS — K219 Gastro-esophageal reflux disease without esophagitis: Secondary | ICD-10-CM | POA: Diagnosis not present

## 2013-06-05 DIAGNOSIS — R32 Unspecified urinary incontinence: Secondary | ICD-10-CM | POA: Diagnosis not present

## 2013-06-05 DIAGNOSIS — I10 Essential (primary) hypertension: Secondary | ICD-10-CM

## 2013-06-05 NOTE — Progress Notes (Signed)
Subjective:    Patient ID: Julia Patterson, female    DOB: 01-06-1930, 80 y.o.   MRN: [id number]  HPI 80 year old female with past history of hypercholesterolemia, hypothyroidism, osteoporosis s/p multiple bisphosphonates and Forteo with spontaneous femur fractures.  She also sees Dr Jacqlyn Larsen for her urinary incontinence.  She comes in today for a scheduled follow up.  She has had two femur fractures.  Second occurred after treatment with Forteo.  Has seen Dr Jefm Bryant.  Per pt report - no further medication warranted for the osteoporosis.  Instructed to continue calcium and vitamin D.  No chest pain or tightness.  Breathing stable.  She had noticed some left breast tenderness.  Mammogram negative.  This has resolved.  Previously had some leg cramps.  She had previously started CoQ10 with noted improvement.  Not a significant issue for her now.  Eating and drinking well.  On Prilosec.  No significant acid reflux now.  No dysphagia.  She reports that she did not tolerate the Toviaz.  Was back on Detrol.  This has been working well for her. Tolerating.  Is seeing Dr Jacqlyn Larsen.  KUB obtained last check - stable.  Was told she had RAS.  Has had ultrasound testing and has seen vascular.  Stable.  Is now living at [facility].  Having some left lateral hip discomfort.  Has been told had arthritis.  Seeing Dr Margaretmary Eddy.  Is s/p injection.  Better.  Overall she feels things are relatively stable.         Past Medical History  Diagnosis Date  . GERD (gastroesophageal reflux disease)   . Hypothyroidism   . Hypercholesterolemia   . Urinary incontinence   . Osteoporosis     s/p fosamax, boniva, reclast, forteo.  bilateral spontaneous femur fractures  . Recurrent cystitis   . Varicose veins     chronic venous insufficiency  . Degenerative arthritis     lumbar spine.  spondylolisthesis, hip and leg pain, scoliosis    Current Outpatient Prescriptions on File Prior to Visit  Medication Sig Dispense  Refill  . acetaminophen (TYLENOL 8 HOUR) 650 MG CR tablet Take 650 mg by mouth 2 (two) times daily.      Marland Kitchen aspirin 81 MG tablet Take 81 mg by mouth daily.      . Calcium Citrate-Vitamin D (CITRACAL + D PO) Take by mouth 3 (three) times daily.      . cholecalciferol (VITAMIN D) 1000 UNITS tablet Take 1,000 Units by mouth 2 (two) times daily.      . Coenzyme Q10 (COQ10) 100 MG CAPS Take 1 or 2 times daily as needed      . fluticasone (FLONASE) 50 MCG/ACT nasal spray Place 2 sprays into both nostrils daily.  16 g  3  . levothyroxine (SYNTHROID, LEVOTHROID) 88 MCG tablet TAKE 1 TABLET DAILY  90 tablet  2  . Multiple Vitamin (MULTIVITAMIN) tablet Take 1 tablet by mouth daily.      . Naproxen Sodium (ALEVE) 220 MG CAPS Take by mouth. Take two by mouth once a day      . omeprazole (PRILOSEC) 20 MG capsule TAKE ONE CAPSULE DAILY  90 capsule  1  . Potassium Gluconate 595 MG CAPS Take by mouth daily.      . pravastatin (PRAVACHOL) 20 MG tablet TAKE 1 TABLET EVERY DAY  90 tablet  1  . tolterodine (DETROL LA) 4 MG 24 hr capsule Take 4 mg by mouth daily.  No current facility-administered medications on file prior to visit.    Review of Systems Patient denies any headache, lightheadedness or dizziness.  No nasal congestion.  No swallowing problems.  Denies any acid reflux currently.  Under control on omeprazole.   No palpitations.  No chest pain.  No increased shortness of breath, cough or congestion.  Breathing stable.  No nausea or vomiting.  Previous abdominal pain resolved.  No bowel change, such as diarrhea, constipation, BRBPR or melana.  Sees Dr Jacqlyn Larsen for her urinary incontinence.  Has been on Detrol.   Found to have RAS.  Being followed by Dr Jacqlyn Larsen and vascular.  Has the hip pain as outlined.  Seeing Dr Tamala Julian.  Overall she feels she is doing well.  Living at [facility] now.         Objective:   Physical Exam  Filed Vitals:   06/05/13 0837  BP: 124/68  Pulse: 81  Temp: 97.9 F (36.6 C)   Resp: 62   80 year old female in no acute distress.   HEENT:  Nares- clear.  Oropharynx - without lesions. NECK:  Supple.  Nontender.  No audible bruit.  HEART:  Appears to be regular. LUNGS:  No crackles or wheezing audible.  Respirations even and unlabored.  RADIAL PULSE:  Equal bilaterally.    ABDOMEN:  Soft, nontender.  Bowel sounds present and normal.  No audible abdominal bruit.    EXTREMITIES:  No increased edema present.  DP pulses palpable and equal bilaterally.           Assessment & Plan:  CARDIOVASCULAR.  Currently doing well.  Follow.    RUQ PAIN.  Resolved.     LEG CRAMPS.  On pravastatin.  Cramps are better on CoQ10.  Not a significant issue for her now.  Follow.    GU.  States was told she had RAS.  Followed by Dr Jacqlyn Larsen and vascular.  Just evaluated.  Stable.   HEALTH MAINTENANCE.  Physical 09/19/12.  Pneumovax 08/28/09. Colonoscopy 06/18/04 - normal.  Left mammogram 05/10/12 - Birads II.  Had bilateral mammogram  02/15/13 - Birads I.    I spent 25 minutes with the patient and more than 50% of the time was spent in consultation regarding the above.

## 2013-06-05 NOTE — Progress Notes (Signed)
Pre visit review using our clinic review tool, if applicable. No additional management support is needed unless otherwise documented below in the visit note. 

## 2013-06-09 ENCOUNTER — Encounter: Payer: Self-pay | Admitting: Internal Medicine

## 2013-06-09 NOTE — Assessment & Plan Note (Signed)
Has tried fosamax, boniva, reclast and Forteo. Two spontaneous femur fractures.  Evaluated by Dr Kernodle.  Recommended continuing vitamin D.  Follow vitamin D level.  Vitamin D level 01/25/13 - 58.   

## 2013-06-09 NOTE — Assessment & Plan Note (Signed)
Back on omeprazole now.  Symptoms controlled.  Had immediate return of symptoms when she stopped the omeprazole for a short period of time.  Have discussed EGD - especially given that she has never had one and given she had immediate return of symptoms off the PPI.  She declines.  Will follow.  No dysphagia.   

## 2013-06-09 NOTE — Assessment & Plan Note (Signed)
Blood pressure as outlined.  Continue to monitor.  Follow metabolic panel.  Seeing vascular.

## 2013-06-09 NOTE — Assessment & Plan Note (Signed)
On thyroid replacement.  Follow tsh.  

## 2013-06-09 NOTE — Assessment & Plan Note (Signed)
On pravastatin.  Follow lipid panel and liver function.

## 2013-06-09 NOTE — Assessment & Plan Note (Signed)
Sees Dr Jacqlyn Larsen.  Did not tolerate Toviaz.  Has been on Detrol.  Had been doing well on this medication.  Follow.  Just evaluated by Dr Jacqlyn Larsen.  Stable.

## 2013-06-09 NOTE — Assessment & Plan Note (Signed)
Saw Dr Chris Smith.  S/p two injections.  Followed by Dr Chris Smith.  Better.    

## 2013-06-12 ENCOUNTER — Encounter: Payer: Self-pay | Admitting: Internal Medicine

## 2013-06-12 DIAGNOSIS — I779 Disorder of arteries and arterioles, unspecified: Secondary | ICD-10-CM

## 2013-06-12 DIAGNOSIS — I701 Atherosclerosis of renal artery: Secondary | ICD-10-CM | POA: Diagnosis not present

## 2013-06-12 DIAGNOSIS — E785 Hyperlipidemia, unspecified: Secondary | ICD-10-CM | POA: Diagnosis not present

## 2013-06-12 DIAGNOSIS — I739 Peripheral vascular disease, unspecified: Secondary | ICD-10-CM

## 2013-06-12 DIAGNOSIS — I6529 Occlusion and stenosis of unspecified carotid artery: Secondary | ICD-10-CM | POA: Diagnosis not present

## 2013-06-19 ENCOUNTER — Other Ambulatory Visit (INDEPENDENT_AMBULATORY_CARE_PROVIDER_SITE_OTHER): Payer: Medicare Other

## 2013-06-19 DIAGNOSIS — E039 Hypothyroidism, unspecified: Secondary | ICD-10-CM

## 2013-06-19 DIAGNOSIS — E7800 Pure hypercholesterolemia, unspecified: Secondary | ICD-10-CM | POA: Diagnosis not present

## 2013-06-19 DIAGNOSIS — I10 Essential (primary) hypertension: Secondary | ICD-10-CM

## 2013-06-19 LAB — HEPATIC FUNCTION PANEL
ALK PHOS: 38 U/L — AB (ref 39–117)
ALT: 20 U/L (ref 0–35)
AST: 21 U/L (ref 0–37)
Albumin: 4.2 g/dL (ref 3.5–5.2)
BILIRUBIN DIRECT: 0.1 mg/dL (ref 0.0–0.3)
Total Bilirubin: 0.6 mg/dL (ref 0.2–1.2)
Total Protein: 6.6 g/dL (ref 6.0–8.3)

## 2013-06-19 LAB — CBC WITH DIFFERENTIAL/PLATELET
BASOS PCT: 0.7 % (ref 0.0–3.0)
Basophils Absolute: 0 10*3/uL (ref 0.0–0.1)
EOS ABS: 0.2 10*3/uL (ref 0.0–0.7)
Eosinophils Relative: 3.2 % (ref 0.0–5.0)
HCT: 44.3 % (ref 36.0–46.0)
HEMOGLOBIN: 15 g/dL (ref 12.0–15.0)
Lymphocytes Relative: 29.5 % (ref 12.0–46.0)
Lymphs Abs: 1.9 10*3/uL (ref 0.7–4.0)
MCHC: 33.9 g/dL (ref 30.0–36.0)
MCV: 94.7 fl (ref 78.0–100.0)
MONO ABS: 0.5 10*3/uL (ref 0.1–1.0)
Monocytes Relative: 8.1 % (ref 3.0–12.0)
NEUTROS ABS: 3.7 10*3/uL (ref 1.4–7.7)
Neutrophils Relative %: 58.5 % (ref 43.0–77.0)
Platelets: 254 10*3/uL (ref 150.0–400.0)
RBC: 4.68 Mil/uL (ref 3.87–5.11)
RDW: 12.7 % (ref 11.5–15.5)
WBC: 6.4 10*3/uL (ref 4.0–10.5)

## 2013-06-19 LAB — LIPID PANEL
CHOL/HDL RATIO: 4
Cholesterol: 172 mg/dL (ref 0–200)
HDL: 44.1 mg/dL (ref >39.00)
LDL CALC: 102 mg/dL — AB (ref 0–99)
Triglycerides: 131 mg/dL (ref 0.0–149.0)
VLDL: 26.2 mg/dL (ref 0.0–40.0)

## 2013-06-19 LAB — BASIC METABOLIC PANEL
BUN: 26 mg/dL — AB (ref 6–23)
CHLORIDE: 108 meq/L (ref 96–112)
CO2: 28 mEq/L (ref 19–32)
CREATININE: 0.9 mg/dL (ref 0.4–1.2)
Calcium: 10.2 mg/dL (ref 8.4–10.5)
GFR: 60.66 mL/min (ref >60.00)
Glucose, Bld: 79 mg/dL (ref 70–99)
Potassium: 4.3 mEq/L (ref 3.5–5.1)
Sodium: 141 mEq/L (ref 135–145)

## 2013-06-19 LAB — TSH: TSH: 0.28 u[IU]/mL — ABNORMAL LOW (ref 0.35–4.50)

## 2013-06-21 ENCOUNTER — Other Ambulatory Visit: Payer: Self-pay | Admitting: *Deleted

## 2013-06-21 ENCOUNTER — Other Ambulatory Visit: Payer: Self-pay | Admitting: Internal Medicine

## 2013-06-21 DIAGNOSIS — E039 Hypothyroidism, unspecified: Secondary | ICD-10-CM

## 2013-06-21 MED ORDER — LEVOTHYROXINE SODIUM 75 MCG PO TABS: 75.0000 ug | ORAL_TABLET | Freq: Every day | ORAL | Status: DC

## 2013-06-21 NOTE — Progress Notes (Signed)
F/u tsh ordered

## 2013-08-02 ENCOUNTER — Other Ambulatory Visit (INDEPENDENT_AMBULATORY_CARE_PROVIDER_SITE_OTHER): Payer: Medicare Other

## 2013-08-02 DIAGNOSIS — E039 Hypothyroidism, unspecified: Secondary | ICD-10-CM

## 2013-08-02 LAB — TSH: TSH: 0.49 u[IU]/mL (ref 0.35–4.50)

## 2013-08-05 ENCOUNTER — Encounter: Payer: Self-pay | Admitting: *Deleted

## 2013-08-15 DIAGNOSIS — H04129 Dry eye syndrome of unspecified lacrimal gland: Secondary | ICD-10-CM | POA: Diagnosis not present

## 2013-08-23 ENCOUNTER — Ambulatory Visit: Payer: Self-pay

## 2013-08-23 DIAGNOSIS — N390 Urinary tract infection, site not specified: Secondary | ICD-10-CM | POA: Diagnosis not present

## 2013-08-23 DIAGNOSIS — R109 Unspecified abdominal pain: Secondary | ICD-10-CM | POA: Diagnosis not present

## 2013-08-23 DIAGNOSIS — N19 Unspecified kidney failure: Secondary | ICD-10-CM | POA: Diagnosis not present

## 2013-08-23 DIAGNOSIS — R52 Pain, unspecified: Secondary | ICD-10-CM | POA: Diagnosis not present

## 2013-08-23 DIAGNOSIS — N200 Calculus of kidney: Secondary | ICD-10-CM | POA: Diagnosis not present

## 2013-08-26 ENCOUNTER — Other Ambulatory Visit: Payer: Self-pay | Admitting: *Deleted

## 2013-08-26 MED ORDER — LEVOTHYROXINE SODIUM 75 MCG PO TABS: 75.0000 ug | ORAL_TABLET | Freq: Every day | ORAL | Status: DC

## 2013-08-30 DIAGNOSIS — Z87440 Personal history of urinary (tract) infections: Secondary | ICD-10-CM | POA: Diagnosis not present

## 2013-08-30 DIAGNOSIS — N8111 Cystocele, midline: Secondary | ICD-10-CM | POA: Diagnosis not present

## 2013-08-30 DIAGNOSIS — N200 Calculus of kidney: Secondary | ICD-10-CM | POA: Diagnosis not present

## 2013-08-30 DIAGNOSIS — R350 Frequency of micturition: Secondary | ICD-10-CM | POA: Diagnosis not present

## 2013-10-01 ENCOUNTER — Encounter: Payer: Medicare Other | Admitting: Internal Medicine

## 2013-10-01 DIAGNOSIS — I701 Atherosclerosis of renal artery: Secondary | ICD-10-CM | POA: Diagnosis not present

## 2013-10-01 DIAGNOSIS — I6529 Occlusion and stenosis of unspecified carotid artery: Secondary | ICD-10-CM | POA: Diagnosis not present

## 2013-10-02 DIAGNOSIS — I739 Peripheral vascular disease, unspecified: Secondary | ICD-10-CM

## 2013-10-02 DIAGNOSIS — I779 Disorder of arteries and arterioles, unspecified: Secondary | ICD-10-CM | POA: Insufficient documentation

## 2013-10-08 ENCOUNTER — Encounter: Payer: Self-pay | Admitting: Internal Medicine

## 2013-10-08 ENCOUNTER — Ambulatory Visit (INDEPENDENT_AMBULATORY_CARE_PROVIDER_SITE_OTHER): Payer: Medicare Other | Admitting: Internal Medicine

## 2013-10-08 ENCOUNTER — Encounter (INDEPENDENT_AMBULATORY_CARE_PROVIDER_SITE_OTHER): Payer: Self-pay

## 2013-10-08 VITALS — BP 120/70 | HR 92 | Temp 98.0°F | Ht 59.0 in | Wt 140.0 lb

## 2013-10-08 DIAGNOSIS — I779 Disorder of arteries and arterioles, unspecified: Secondary | ICD-10-CM | POA: Diagnosis not present

## 2013-10-08 DIAGNOSIS — L989 Disorder of the skin and subcutaneous tissue, unspecified: Secondary | ICD-10-CM

## 2013-10-08 DIAGNOSIS — M810 Age-related osteoporosis without current pathological fracture: Secondary | ICD-10-CM

## 2013-10-08 DIAGNOSIS — E7800 Pure hypercholesterolemia, unspecified: Secondary | ICD-10-CM

## 2013-10-08 DIAGNOSIS — N649 Disorder of breast, unspecified: Secondary | ICD-10-CM

## 2013-10-08 DIAGNOSIS — R1012 Left upper quadrant pain: Secondary | ICD-10-CM

## 2013-10-08 DIAGNOSIS — K219 Gastro-esophageal reflux disease without esophagitis: Secondary | ICD-10-CM

## 2013-10-08 DIAGNOSIS — M169 Osteoarthritis of hip, unspecified: Secondary | ICD-10-CM

## 2013-10-08 DIAGNOSIS — I739 Peripheral vascular disease, unspecified: Secondary | ICD-10-CM

## 2013-10-08 DIAGNOSIS — E039 Hypothyroidism, unspecified: Secondary | ICD-10-CM

## 2013-10-08 DIAGNOSIS — I701 Atherosclerosis of renal artery: Secondary | ICD-10-CM | POA: Diagnosis not present

## 2013-10-08 DIAGNOSIS — I10 Essential (primary) hypertension: Secondary | ICD-10-CM

## 2013-10-08 DIAGNOSIS — R32 Unspecified urinary incontinence: Secondary | ICD-10-CM

## 2013-10-08 DIAGNOSIS — M1610 Unilateral primary osteoarthritis, unspecified hip: Secondary | ICD-10-CM

## 2013-10-08 MED ORDER — OMEPRAZOLE 20 MG PO CPDR: 20.0000 mg | DELAYED_RELEASE_CAPSULE | Freq: Two times a day (BID) | ORAL | Status: DC

## 2013-10-08 MED ORDER — NYSTATIN 100000 UNIT/GM EX CREA: 1.0000 "application " | TOPICAL_CREAM | Freq: Two times a day (BID) | CUTANEOUS | Status: DC

## 2013-10-08 NOTE — Progress Notes (Signed)
Pre visit review using our clinic review tool, if applicable. No additional management support is needed unless otherwise documented below in the visit note. 

## 2013-10-12 ENCOUNTER — Encounter: Payer: Self-pay | Admitting: Internal Medicine

## 2013-10-12 DIAGNOSIS — L989 Disorder of the skin and subcutaneous tissue, unspecified: Secondary | ICD-10-CM | POA: Insufficient documentation

## 2013-10-12 NOTE — Assessment & Plan Note (Signed)
Pain and exam as outlined.  Check abdominal ultrasound.

## 2013-10-12 NOTE — Assessment & Plan Note (Signed)
Blood pressure as outlined.  Doing well.  Continue to monitor.  Follow metabolic panel.  Seeing vascular.

## 2013-10-12 NOTE — Progress Notes (Signed)
Subjective:    Patient ID: Julia Patterson, female    DOB: 07-Apr-1929, 80 y.o.   MRN: [id number]  HPI 80 year old female with past history of hypercholesterolemia, hypothyroidism, osteoporosis s/p multiple bisphosphonates and Forteo with spontaneous femur fractures.  She also sees Dr Jacqlyn Larsen for her urinary incontinence.  She comes in today to follow up on these issues as well as for a complete physical exam.  She has had two femur fractures.  Second occurred after treatment with Forteo.  Has seen Dr Jefm Bryant.  Per pt report - no further medication warranted for the osteoporosis.  Instructed to continue calcium and vitamin D.  No chest pain or tightness.  Breathing stable.  She had noticed some left breast tenderness.  Mammogram negative.  This has resolved.  Previously had some leg cramps.  She started CoQ10 with noted improvement.  Not a significant issue for her now.  Eating and drinking well.  On Prilosec.  No significant acid reflux now.  No dysphagia.  Is seeing Dr Jacqlyn Larsen.  KUB obtained last check - stable.  Was told she had RAS.  Has had ultrasound testing and has seen vascular.  Stable.  Is living at [facility].  Had some left lateral hip discomfort.  Has been told had arthritis.  Saw Dr Margaretmary Eddy.  Is s/p injection.  Better.  Overall she feels things are relatively stable.         Past Medical History  Diagnosis Date  . GERD (gastroesophageal reflux disease)   . Hypothyroidism   . Hypercholesterolemia   . Urinary incontinence   . Osteoporosis     s/p fosamax, boniva, reclast, forteo.  bilateral spontaneous femur fractures  . Recurrent cystitis   . Varicose veins     chronic venous insufficiency  . Degenerative arthritis     lumbar spine.  spondylolisthesis, hip and leg pain, scoliosis    Current Outpatient Prescriptions on File Prior to Visit  Medication Sig Dispense Refill  . acetaminophen (TYLENOL 8 HOUR) 650 MG CR tablet Take 650 mg by mouth 2 (two) times daily.       Marland Kitchen aspirin 81 MG tablet Take 81 mg by mouth daily.      . Calcium Citrate-Vitamin D (CITRACAL + D PO) Take by mouth 3 (three) times daily.      . cholecalciferol (VITAMIN D) 1000 UNITS tablet Take 1,000 Units by mouth 2 (two) times daily.      . Coenzyme Q10 (COQ10) 100 MG CAPS Take 1 or 2 times daily as needed      . fluticasone (FLONASE) 50 MCG/ACT nasal spray Place 2 sprays into both nostrils daily.  16 g  3  . levothyroxine (SYNTHROID, LEVOTHROID) 75 MCG tablet Take 1 tablet (75 mcg total) by mouth daily.  90 tablet  1  . Multiple Vitamin (MULTIVITAMIN) tablet Take 1 tablet by mouth daily.      . Naproxen Sodium (ALEVE) 220 MG CAPS Take by mouth. Take two by mouth once a day      . Potassium Gluconate 595 MG CAPS Take by mouth daily.      . pravastatin (PRAVACHOL) 20 MG tablet TAKE 1 TABLET EVERY DAY  90 tablet  1  . tolterodine (DETROL LA) 4 MG 24 hr capsule Take 4 mg by mouth daily.       No current facility-administered medications on file prior to visit.    Review of Systems Patient denies any headache, lightheadedness or dizziness.  No  nasal congestion.  No swallowing problems.  Denies any acid reflux currently.  Under control on omeprazole.   No palpitations.  No chest pain.  No increased shortness of breath, cough or congestion.  Breathing stable.  No nausea or vomiting.  Previous abdominal pain resolved.  Is having some LUQ pain.  No bowel change, such as diarrhea, constipation, BRBPR or melana.  Sees Dr Jacqlyn Larsen for her urinary incontinence.  Has been on Detrol.   Found to have RAS.  Being followed by Dr Jacqlyn Larsen and vascular.  Had the hip pain as outlined.  Saw Dr Tamala Julian.  S/p injection.  Overall she feels she is doing well.  Living at [facility] now.         Objective:   Physical Exam  Filed Vitals:   10/08/13 1443  BP: 120/70  Pulse: 92  Temp: 98 F (36.7 C)   Blood pressure recheck:  130/72 right and 92/62 left.   80 year old female in no acute distress.   HEENT:  Nares-  clear.  Oropharynx - without lesions. NECK:  Supple.  Nontender.  No audible bruit.  HEART:  Appears to be regular. LUNGS:  No crackles or wheezing audible.  Respirations even and unlabored.  RADIAL PULSE:  Equal bilaterally.   BREASTS:  No nipple discharge or nipple retraction present.  Could not appreciate any distinct palpable nodules or axillary adenopathy.    ABDOMEN:  Soft.  Tenderness to palpation LUQ.   Bowel sounds present and normal.  No audible abdominal bruit.    EXTREMITIES:  No increased edema present.  DP pulses palpable and equal bilaterally.           Assessment & Plan:  CARDIOVASCULAR.  Currently doing well.  Follow.    RUQ PAIN.  Resolved.     LEG CRAMPS.  On pravastatin.  Cramps are better on CoQ10.  Not a significant issue for her now.  Follow.    GU.  States was told she had RAS.  Followed by Dr Jacqlyn Larsen and vascular.  Just evaluated.  Stable.   HEALTH MAINTENANCE.  Physical today.   Pneumovax 08/28/09. Colonoscopy 06/18/04 - normal.  Mammogram 02/15/13 - Birads I.    I spent 25 minutes with the patient and more than 50% of the time was spent in consultation regarding the above.

## 2013-10-12 NOTE — Assessment & Plan Note (Signed)
Has tried fosamax, boniva, reclast and Forteo. Two spontaneous femur fractures.  Evaluated by Dr Jefm Bryant.  Recommended continuing vitamin D.  Follow vitamin D level.  Vitamin D level 01/25/13 - 58.

## 2013-10-12 NOTE — Assessment & Plan Note (Signed)
Persistent breast lesion.  Refer to dermatology.

## 2013-10-12 NOTE — Assessment & Plan Note (Signed)
On thyroid replacement.  Follow tsh.  

## 2013-10-12 NOTE — Assessment & Plan Note (Signed)
Followed by vascular surgery.  Was evaluated 06/12/13.  Recommended f/u in 6 months.

## 2013-10-12 NOTE — Assessment & Plan Note (Signed)
On pravastatin.  Follow lipid panel and liver function.

## 2013-10-12 NOTE — Assessment & Plan Note (Signed)
Evaluated by Dr Lucky Cowboy 10/01/13 with carotid duplex revealing 50-69% bilateral ICA stenosis.  Left vertebral artery is retrograde.  Recommended 6 month f/u.

## 2013-10-12 NOTE — Assessment & Plan Note (Signed)
Back on omeprazole now.  Symptoms controlled.  Had immediate return of symptoms when she stopped the omeprazole for a short period of time.  Have discussed EGD - especially given that she has never had one and given she had immediate return of symptoms off the PPI.  She declines.  Will follow.  No dysphagia.

## 2013-10-12 NOTE — Assessment & Plan Note (Signed)
Saw Dr Margaretmary Eddy.  S/p two injections.  Followed by Dr Margaretmary Eddy.  Better.

## 2013-10-12 NOTE — Assessment & Plan Note (Signed)
Sees Dr Jacqlyn Larsen.  Did not tolerate Toviaz.  Has been on Detrol.  Had been doing well on this medication.  Follow.  Stable.

## 2013-10-15 ENCOUNTER — Ambulatory Visit: Payer: Self-pay | Admitting: Internal Medicine

## 2013-10-15 ENCOUNTER — Telehealth: Payer: Self-pay | Admitting: Internal Medicine

## 2013-10-15 DIAGNOSIS — N269 Renal sclerosis, unspecified: Secondary | ICD-10-CM | POA: Diagnosis not present

## 2013-10-15 DIAGNOSIS — R1012 Left upper quadrant pain: Secondary | ICD-10-CM | POA: Diagnosis not present

## 2013-10-15 DIAGNOSIS — N200 Calculus of kidney: Secondary | ICD-10-CM | POA: Diagnosis not present

## 2013-10-15 NOTE — Telephone Encounter (Signed)
Notify pt that her abdominal ultrasound reveals no acute abnormality.  She has a persistent left kidney stone - as seen on previous CT.  No significant change in kidneys from previous.  Liver and spleen appear normal.

## 2013-10-16 ENCOUNTER — Telehealth: Payer: Self-pay | Admitting: *Deleted

## 2013-10-16 MED ORDER — OMEPRAZOLE 20 MG PO CPDR: 20.0000 mg | DELAYED_RELEASE_CAPSULE | Freq: Every day | ORAL | Status: DC

## 2013-10-16 NOTE — Telephone Encounter (Signed)
Pt.notified

## 2013-10-16 NOTE — Telephone Encounter (Signed)
Pt wanted to know the status of her Dermatology referral. I mentioned that the request was faxed on yesterday. However, it is documented that it was sent to "ENT". Just wanted to be sure that it was actually sent to dermatology.

## 2013-10-22 DIAGNOSIS — N200 Calculus of kidney: Secondary | ICD-10-CM | POA: Diagnosis not present

## 2013-10-22 DIAGNOSIS — Z87440 Personal history of urinary (tract) infections: Secondary | ICD-10-CM | POA: Diagnosis not present

## 2013-10-22 DIAGNOSIS — R32 Unspecified urinary incontinence: Secondary | ICD-10-CM | POA: Diagnosis not present

## 2013-10-30 ENCOUNTER — Encounter: Payer: Self-pay | Admitting: Internal Medicine

## 2013-11-06 ENCOUNTER — Telehealth: Payer: Self-pay | Admitting: Internal Medicine

## 2013-11-06 ENCOUNTER — Other Ambulatory Visit: Payer: Medicare Other

## 2013-11-06 DIAGNOSIS — N390 Urinary tract infection, site not specified: Secondary | ICD-10-CM | POA: Diagnosis not present

## 2013-11-06 DIAGNOSIS — Z87440 Personal history of urinary (tract) infections: Secondary | ICD-10-CM | POA: Diagnosis not present

## 2013-11-06 NOTE — Telephone Encounter (Signed)
Pt notified & states that she is on her way to drop off a urine at Urology now to r/o bladder infection. If negative she states that she will go to Acute Care.

## 2013-11-06 NOTE — Telephone Encounter (Signed)
They notified her I am not in office.  With the current symptoms, weakness , fatigue, no energy and chills, I do think she needs to be seen today.  I recommend acute care - can get labs, etc stat.  Need to make sure no infection present.

## 2013-11-06 NOTE — Telephone Encounter (Signed)
Patient Information:  Caller Name: Bristyn  Phone: [phone]  Patient: Julia Patterson, Julia Patterson  Gender: Female  DOB: January 31, 1930  Age: 80 Years  PCP: Einar Pheasant  Office Follow Up:  Does the office need to follow up with this patient?: Yes  Instructions For The Office: would like to hear from the office on sxs; no appts available and pt doesn't want to use another office or UC  RN Note:  no appts available for today or tomorrow at the office; offered appt at another [facility] facility, but Benjamine Mola declined; prefers to be seen in the office  Symptoms  Reason For Call & Symptoms: c/o chills that last 60mins each night when she goes to bed; says she then becomes fatigued which lasts the next day; say she put on extra clothes, blankets and heating pads, but awakes hrs later hot; temp 98.6 when it happened the first time; says only difference is she changed to a new bottle of Flonase; had some dry heaves yesterday; denies vomiting; feels weak  Reviewed Health History In EMR: Yes  Reviewed Medications In EMR: Yes  Reviewed Allergies In EMR: Yes  Reviewed Surgeries / Procedures: Yes  Date of Onset of Symptoms: 11/04/2013  Guideline(s) Used:  Weakness (Generalized) and Fatigue  Disposition Per Guideline:   Go to Office Now  Reason For Disposition Reached:   Moderate weakness (i.e., interferes with work, school, normal activities) and cause unknown  Advice Given:  N/A  RN Overrode Recommendation:  Follow Up With Office Later  would like to hear from the office

## 2013-11-06 NOTE — Telephone Encounter (Signed)
Called pt, advised MD is not in the office this afternoon.  Also advised there were no appointments at this location for the rest of the week.  Pt said "if that's all you can do."  And hung up the phone.

## 2013-11-18 DIAGNOSIS — L821 Other seborrheic keratosis: Secondary | ICD-10-CM | POA: Diagnosis not present

## 2013-11-18 DIAGNOSIS — L820 Inflamed seborrheic keratosis: Secondary | ICD-10-CM | POA: Diagnosis not present

## 2013-11-22 ENCOUNTER — Other Ambulatory Visit (INDEPENDENT_AMBULATORY_CARE_PROVIDER_SITE_OTHER): Payer: Medicare Other

## 2013-11-22 DIAGNOSIS — E7800 Pure hypercholesterolemia, unspecified: Secondary | ICD-10-CM

## 2013-11-22 DIAGNOSIS — I10 Essential (primary) hypertension: Secondary | ICD-10-CM | POA: Diagnosis not present

## 2013-11-22 LAB — LIPID PANEL
CHOL/HDL RATIO: 5
Cholesterol: 209 mg/dL — ABNORMAL HIGH (ref 0–200)
HDL: 39.6 mg/dL (ref >39.00)
LDL Cholesterol: 133 mg/dL — ABNORMAL HIGH (ref 0–99)
NONHDL: 169.4
Triglycerides: 183 mg/dL — ABNORMAL HIGH (ref 0.0–149.0)
VLDL: 36.6 mg/dL (ref 0.0–40.0)

## 2013-11-22 LAB — HEPATIC FUNCTION PANEL
ALK PHOS: 49 U/L (ref 39–117)
ALT: 17 U/L (ref 0–35)
AST: 20 U/L (ref 0–37)
Albumin: 3.7 g/dL (ref 3.5–5.2)
Bilirubin, Direct: 0 mg/dL (ref 0.0–0.3)
Total Bilirubin: 0.7 mg/dL (ref 0.2–1.2)
Total Protein: 7.6 g/dL (ref 6.0–8.3)

## 2013-11-22 LAB — BASIC METABOLIC PANEL
BUN: 25 mg/dL — ABNORMAL HIGH (ref 6–23)
CHLORIDE: 107 meq/L (ref 96–112)
CO2: 27 meq/L (ref 19–32)
CREATININE: 1 mg/dL (ref 0.4–1.2)
Calcium: 10 mg/dL (ref 8.4–10.5)
GFR: 53.92 mL/min — ABNORMAL LOW (ref >60.00)
Glucose, Bld: 89 mg/dL (ref 70–99)
Potassium: 4.4 mEq/L (ref 3.5–5.1)
SODIUM: 140 meq/L (ref 135–145)

## 2013-11-25 ENCOUNTER — Encounter: Payer: Self-pay | Admitting: *Deleted

## 2013-11-28 DIAGNOSIS — Z23 Encounter for immunization: Secondary | ICD-10-CM | POA: Diagnosis not present

## 2013-12-06 DIAGNOSIS — Z23 Encounter for immunization: Secondary | ICD-10-CM | POA: Diagnosis not present

## 2013-12-17 DIAGNOSIS — I701 Atherosclerosis of renal artery: Secondary | ICD-10-CM | POA: Diagnosis not present

## 2013-12-17 DIAGNOSIS — I6529 Occlusion and stenosis of unspecified carotid artery: Secondary | ICD-10-CM | POA: Diagnosis not present

## 2014-02-11 ENCOUNTER — Ambulatory Visit (INDEPENDENT_AMBULATORY_CARE_PROVIDER_SITE_OTHER): Payer: Medicare Other | Admitting: Internal Medicine

## 2014-02-11 ENCOUNTER — Encounter: Payer: Self-pay | Admitting: Internal Medicine

## 2014-02-11 VITALS — BP 130/70 | HR 82 | Temp 98.0°F | Ht 59.0 in | Wt 139.8 lb

## 2014-02-11 DIAGNOSIS — R079 Chest pain, unspecified: Secondary | ICD-10-CM | POA: Diagnosis not present

## 2014-02-11 DIAGNOSIS — E039 Hypothyroidism, unspecified: Secondary | ICD-10-CM

## 2014-02-11 DIAGNOSIS — E7800 Pure hypercholesterolemia, unspecified: Secondary | ICD-10-CM

## 2014-02-11 DIAGNOSIS — K219 Gastro-esophageal reflux disease without esophagitis: Secondary | ICD-10-CM | POA: Diagnosis not present

## 2014-02-11 DIAGNOSIS — R21 Rash and other nonspecific skin eruption: Secondary | ICD-10-CM

## 2014-02-11 DIAGNOSIS — I701 Atherosclerosis of renal artery: Secondary | ICD-10-CM | POA: Diagnosis not present

## 2014-02-11 DIAGNOSIS — I779 Disorder of arteries and arterioles, unspecified: Secondary | ICD-10-CM | POA: Diagnosis not present

## 2014-02-11 DIAGNOSIS — I10 Essential (primary) hypertension: Secondary | ICD-10-CM | POA: Diagnosis not present

## 2014-02-11 DIAGNOSIS — I739 Peripheral vascular disease, unspecified: Secondary | ICD-10-CM

## 2014-02-11 NOTE — Progress Notes (Signed)
Subjective:    Patient ID: Julia Patterson, female    DOB: 02/06/1930, 80 y.o.   MRN: [id number]  HPI 80 year old female with past history of hypercholesterolemia, hypothyroidism, osteoporosis s/p multiple bisphosphonates and Forteo with spontaneous femur fractures.  She also sees Dr Erlene Quan (now) for her urological issues.  She comes in today for a scheduled follow up.   She has had two femur fractures.  Second occurred after treatment with Forteo.  Has seen Dr Jefm Bryant.  Per pt report - no further medication warranted for the osteoporosis.  Instructed to continue calcium and vitamin D.  Breathing stable.   Previously had some leg cramps.  She started CoQ10 with noted improvement.  Not a significant issue for her now.  Eating and drinking well.  On Prilosec.  No significant acid reflux now.  No dysphagia.   Has RAS.  Has had ultrasound testing and has seen vascular.  Stable.  See there note for details.  Is living at [facility].  Had some left lateral hip discomfort.  Has been told had arthritis.  Saw Dr Margaretmary Eddy.  Is s/p injection.  Better.  She reports recently having a uti.  Saw dr Lyla Son.  Treated with abx.  Better now.  She does report noticing pain behind her left breast.  May last 5-10 minutes.  No triggers.  No pain with deep breathing.  States is not reproducible on exam.  Also notices a rash right anterior chest.  Itches.         Past Medical History  Diagnosis Date  . GERD (gastroesophageal reflux disease)   . Hypothyroidism   . Hypercholesterolemia   . Urinary incontinence   . Osteoporosis     s/p fosamax, boniva, reclast, forteo.  bilateral spontaneous femur fractures  . Recurrent cystitis   . Varicose veins     chronic venous insufficiency  . Degenerative arthritis     lumbar spine.  spondylolisthesis, hip and leg pain, scoliosis    Current Outpatient Prescriptions on File Prior to Visit  Medication Sig Dispense Refill  . acetaminophen (TYLENOL 8 HOUR) 650  MG CR tablet Take 650 mg by mouth 2 (two) times daily.    Marland Kitchen aspirin 81 MG tablet Take 81 mg by mouth daily.    . Calcium Citrate-Vitamin D (CITRACAL + D PO) Take by mouth 3 (three) times daily.    . cholecalciferol (VITAMIN D) 1000 UNITS tablet Take 1,000 Units by mouth 2 (two) times daily.    . Coenzyme Q10 (COQ10) 100 MG CAPS Take 1 or 2 times daily as needed    . fluticasone (FLONASE) 50 MCG/ACT nasal spray Place 2 sprays into both nostrils daily. 16 g 3  . levothyroxine (SYNTHROID, LEVOTHROID) 75 MCG tablet Take 1 tablet (75 mcg total) by mouth daily. 90 tablet 1  . Multiple Vitamin (MULTIVITAMIN) tablet Take 1 tablet by mouth daily.    . Naproxen Sodium (ALEVE) 220 MG CAPS Take by mouth as needed. Take two by mouth once a day    . nystatin cream (MYCOSTATIN) Apply 1 application topically 2 (two) times daily. 30 g 0  . omeprazole (PRILOSEC) 20 MG capsule Take 1 capsule (20 mg total) by mouth daily. 90 capsule 1  . Potassium Gluconate 595 MG CAPS Take by mouth daily.    . pravastatin (PRAVACHOL) 20 MG tablet TAKE 1 TABLET EVERY DAY 90 tablet 1  . tolterodine (DETROL LA) 4 MG 24 hr capsule Take 4 mg by mouth  daily.     No current facility-administered medications on file prior to visit.    Review of Systems Patient denies any headache, lightheadedness or dizziness.  No nasal congestion.  Some drainage.  Using flonase.  Wants to change to am.  Feels will work better.  No swallowing problems.  Denies any acid reflux currently.  Under control on omeprazole.   No palpitations.  Chest pain as outlined.  Located behind the left breast.  No triggers.   No increased shortness of breath, cough or congestion.  Breathing stable.  No pain with deep breathing.  No nausea or vomiting.  Previous abdominal pain resolved.  No bowel change, such as diarrhea, constipation, BRBPR or melana.  Sees Dr Lyla Son for her urinary issues.   Treated recently for uti.  Symptoms resolved.  Has been on Detrol.   Found to have  RAS.  Being followed by vascular.  Stable.  Had the hip pain as outlined.  Saw Dr Tamala Julian.  S/p injection.  Hip has been doing well.   Rash on right anterior chest as outlined.         Objective:   Physical Exam  Filed Vitals:   02/11/14 1408  BP: 130/70  Pulse: 82  Temp: 98 F (77.34 C)   80 year old female in no acute distress.   HEENT:  Nares- clear.  Oropharynx - without lesions. NECK:  Supple.  Nontender.  No audible bruit.  HEART:  Appears to be regular. LUNGS:  No crackles or wheezing audible.  Respirations even and unlabored.  BREASTS:  Increased tenderness to palpation - left breast ( 9-11:00 region).  (this is not the chest pain she describes.  States two different pains).    RADIAL PULSE:  Equal bilaterally.    ABDOMEN:  Soft.  Non tender.  Bowel sounds present and normal.  No audible abdominal bruit.    EXTREMITIES:  No increased edema present.  SKIN:  Rash - right upper anterior chest.  Erythematous base.  Appears to be c/w contact dermatitis.            Assessment & Plan:  1. Chest pain, unspecified chest pain type Pain as outlined.  Not reproducible on exam.  EKG obtained given persistent intermittent pain and risk factors.  EKG revealed SR with conduction delay and ST changes as outlined.  Given these changes (with no old EKG for comparison) and pain, I do feel further cardiac w/up is warranted.   Discussed with pt.  She is in agreement for cardiology referral.  She is currently asymptomatic.  Explained that if she had any reoccurring or worsening problems, she was to be evaluated immediately.  Pt comfortable with this plan.  - EKG 12-Lead - Ambulatory referral to Cardiology  2. Hypercholesterolemia Low cholesterol diet.  On pravastatin.  Follow lipid panel and liver function.    3. Hypothyroidism, unspecified hypothyroidism type On levothyroxine.  Follow tsh.    4. Gastroesophageal reflux disease, esophagitis presence not specified Controlled on prilosec.   5.  Bilateral carotid artery disease Evaluated by Dr Lucky Cowboy (10/01/13) - carotid duplex revelas stable 50-69% bilateral ICA stenosis.  Left vertebral artery is retrograde (seen previously).  Recommended f/u in 6 months.  Being followed by vascular surgery.    6. Renal artery stenosis Just evaluated by vascular surgery.  Stable.  Recommended f/u in 6 months.  No further intervention warranted.  Blood pressure under reasonable control.  Cr 1.0.    7. Essential hypertension, benign Blood pressure  under reasonable control.  Same medications.  Follow met b.    8. Rash Appears to be c/w contact dermatitis.  TCC .1% as directed.  Notify me if persistent.    9. LEG CRAMPS.  On pravastatin.  Cramps are better on CoQ10.  Not a significant issue for her now.  Follow.    10. GU.  States was told she had RAS.  Followed by vascular.  Just recently evaluated.  Stable.   HEALTH MAINTENANCE.  Physical 10/08/13.   Pneumovax 08/28/09. Colonoscopy 06/18/04 - normal.  Mammogram 02/15/13 - Birads I.    I spent 25 minutes with the patient and more than 50% of the time was spent in consultation regarding the above.

## 2014-02-11 NOTE — Progress Notes (Signed)
Pre visit review using our clinic review tool, if applicable. No additional management support is needed unless otherwise documented below in the visit note. 

## 2014-02-12 ENCOUNTER — Encounter: Payer: Self-pay | Admitting: Cardiovascular Disease

## 2014-02-12 ENCOUNTER — Telehealth: Payer: Self-pay | Admitting: Internal Medicine

## 2014-02-12 ENCOUNTER — Encounter: Payer: Self-pay | Admitting: Internal Medicine

## 2014-02-12 ENCOUNTER — Ambulatory Visit (INDEPENDENT_AMBULATORY_CARE_PROVIDER_SITE_OTHER): Payer: Medicare Other | Admitting: Cardiovascular Disease

## 2014-02-12 VITALS — BP 154/84 | HR 91 | Ht 60.0 in | Wt 138.8 lb

## 2014-02-12 DIAGNOSIS — I701 Atherosclerosis of renal artery: Secondary | ICD-10-CM | POA: Diagnosis not present

## 2014-02-12 DIAGNOSIS — R079 Chest pain, unspecified: Secondary | ICD-10-CM | POA: Diagnosis not present

## 2014-02-12 DIAGNOSIS — R011 Cardiac murmur, unspecified: Secondary | ICD-10-CM | POA: Diagnosis not present

## 2014-02-12 DIAGNOSIS — R21 Rash and other nonspecific skin eruption: Secondary | ICD-10-CM | POA: Insufficient documentation

## 2014-02-12 MED ORDER — TRIAMCINOLONE ACETONIDE 0.1 % EX CREA: 1.0000 "application " | TOPICAL_CREAM | Freq: Two times a day (BID) | CUTANEOUS | Status: DC

## 2014-02-12 NOTE — Assessment & Plan Note (Signed)
The murmur suggestive of aortic stenosis which does not seem to be severe by physical exam. I requested an echocardiogram for evaluation.

## 2014-02-12 NOTE — Progress Notes (Signed)
Primary care physician: Dr. Einar Pheasant  HPI  This is an 80 year old female with past history of hypercholesterolemia, hypothyroidism, asymptomatic carotid stenosis and osteoporosis . She was referred for evaluation of chest pain and abnormal EKG. She is not aware of any previous cardiac history. She had 3-4 episodes of left-sided chest pain over the last month. The pain is described as twinges lasting for a few minutes with no radiation. These episodes happen at rest and not with physical activities. She does complain of exertional dyspnea which has been a chronic complaint. No orthopnea, PND or lower extremity edema. She is not a smoker. She does have family history of coronary artery disease. She had no recent cardiac workup. She did have an echocardiogram in 2011 which showed normal LV systolic function with mild to moderate mitral regurgitation. ECG from yesterday showed sinus rhythm with left bundle branch block which is new compared to most recent ECG in 2009.  Allergies  Allergen Reactions  . Sulfa Antibiotics   . Thimerosal      Current Outpatient Prescriptions on File Prior to Visit  Medication Sig Dispense Refill  . acetaminophen (TYLENOL 8 HOUR) 650 MG CR tablet Take 650 mg by mouth 2 (two) times daily.    Marland Kitchen aspirin 81 MG tablet Take 81 mg by mouth daily.    . Calcium Citrate-Vitamin D (CITRACAL + D PO) Take by mouth 3 (three) times daily.    . cholecalciferol (VITAMIN D) 1000 UNITS tablet Take 1,000 Units by mouth 2 (two) times daily.    . Coenzyme Q10 (COQ10) 100 MG CAPS Take 1 or 2 times daily as needed    . fluticasone (FLONASE) 50 MCG/ACT nasal spray Place 2 sprays into both nostrils daily. 16 g 3  . levothyroxine (SYNTHROID, LEVOTHROID) 75 MCG tablet Take 1 tablet (75 mcg total) by mouth daily. 90 tablet 1  . Multiple Vitamin (MULTIVITAMIN) tablet Take 1 tablet by mouth daily.    . Naproxen Sodium (ALEVE) 220 MG CAPS Take by mouth as needed. Take two by mouth once a day     . nystatin cream (MYCOSTATIN) Apply 1 application topically 2 (two) times daily. 30 g 0  . omeprazole (PRILOSEC) 20 MG capsule Take 1 capsule (20 mg total) by mouth daily. 90 capsule 1  . Potassium Gluconate 595 MG CAPS Take by mouth daily.    . pravastatin (PRAVACHOL) 20 MG tablet TAKE 1 TABLET EVERY DAY 90 tablet 1  . tolterodine (DETROL LA) 4 MG 24 hr capsule Take 4 mg by mouth daily.    Marland Kitchen triamcinolone cream (KENALOG) 0.1 % Apply 1 application topically 2 (two) times daily. Do not use for more than 7-10 days in the same area. 30 g 0   No current facility-administered medications on file prior to visit.     Past Medical History  Diagnosis Date  . GERD (gastroesophageal reflux disease)   . Hypothyroidism   . Hypercholesterolemia   . Urinary incontinence   . Osteoporosis     s/p fosamax, boniva, reclast, forteo.  bilateral spontaneous femur fractures  . Recurrent cystitis   . Varicose veins     chronic venous insufficiency  . Degenerative arthritis     lumbar spine.  spondylolisthesis, hip and leg pain, scoliosis  . Carotid artery occlusion      Past Surgical History  Procedure Laterality Date  . Tonsillectomy and adenoidectomy  1950  . Varicose vein ligation  1972  . Uterus suspension and sterilization  1963  .  Breast reduction surgery  1987  . Hysterectomy and bladder repair  1998  . Varicose vein sclerotherapy      Dr Hulda Humphrey  . L5-l6 fusion  2/07  . Trigger finger repair      right fourth     Family History  Problem Relation Age of Onset  . Breast cancer Daughter   . Rheumatic fever Mother     died age 50  . Heart disease      father's family  . Diabetes      father's family  . Heart attack Father      History   Social History  . Marital Status: Divorced    Spouse Name: N/A    Number of Children: N/A  . Years of Education: N/A   Occupational History  . Not on file.   Social History Main Topics  . Smoking status: Never Smoker   . Smokeless  tobacco: Never Used  . Alcohol Use: No  . Drug Use: No  . Sexual Activity: Not on file   Other Topics Concern  . Not on file   Social History Narrative     ROS A 10 point review of system was performed. It is negative other than that mentioned in the history of present illness.   PHYSICAL EXAM   Ht 5' (1.524 m)  Wt 138 lb 12 oz (62.937 kg)  BMI 27.10 kg/m2 Constitutional: She is oriented to person, place, and time. She appears well-developed and well-nourished. No distress.  HENT: No nasal discharge.  Head: Normocephalic and atraumatic.  Eyes: Pupils are equal and round. No discharge.  Neck: Normal range of motion. Neck supple. No JVD present. No thyromegaly present.  Cardiovascular: Normal rate, regular rhythm, normal heart sounds. Exam reveals no gallop and no friction rub. There is a 2/6 systolic ejection murmur at the aortic area which is mid peaking with preserved S2.  Pulmonary/Chest: Effort normal and breath sounds normal. No stridor. No respiratory distress. She has no wheezes. She has no rales. She exhibits no tenderness.  Abdominal: Soft. Bowel sounds are normal. She exhibits no distension. There is no tenderness. There is no rebound and no guarding.  Musculoskeletal: Normal range of motion. She exhibits no edema and no tenderness.  Neurological: She is alert and oriented to person, place, and time. Coordination normal.  Skin: Skin is warm and dry. No rash noted. She is not diaphoretic. No erythema. No pallor.  Psychiatric: She has a normal mood and affect. Her behavior is normal. Judgment and thought content normal.     EKG: Recent ECG showed sinus rhythm with left bundle branch block.   ASSESSMENT AND PLAN

## 2014-02-12 NOTE — Telephone Encounter (Signed)
Just schedule f/u appt.  No labs need to be scheduled at this time.

## 2014-02-12 NOTE — Telephone Encounter (Signed)
Pt left without being able to schedule f/u appt.  (secondary to alarm).  Please schedule her for a f/u appt in 6 weeks (71min) or end of 1/2 day.  Also schedule

## 2014-02-12 NOTE — Patient Instructions (Addendum)
Your physician has requested that you have an echocardiogram. Echocardiography is a painless test that uses sound waves to create images of your heart. It provides your doctor with information about the size and shape of your heart and how well your heart's chambers and valves are working. This procedure takes approximately one hour. There are no restrictions for this procedure.  [place]  Your caregiver has ordered a Stress Test with nuclear imaging. The purpose of this test is to evaluate the blood supply to your heart muscle. This procedure is referred to as a "Non-Invasive Stress Test." This is because other than having an IV started in your vein, nothing is inserted or "invades" your body. Cardiac stress tests are done to find areas of poor blood flow to the heart by determining the extent of coronary artery disease (CAD). Some patients exercise on a treadmill, which naturally increases the blood flow to your heart, while others who are  unable to walk on a treadmill due to physical limitations have a pharmacologic/chemical stress agent called Lexiscan . This medicine will mimic walking on a treadmill by temporarily increasing your coronary blood flow.   Please note: these test may take anywhere between 2-4 hours to complete  PLEASE REPORT TO [facility] AT THE FIRST DESK WILL DIRECT YOU WHERE TO GO  Date of Procedure:____Wednesday, January 6____  Arrival Time for Procedure:____7:15 am__________  Instructions regarding medication:   PLEASE NOTIFY THE OFFICE AT LEAST 24 HOURS IN ADVANCE IF YOU ARE UNABLE TO KEEP YOUR APPOINTMENT.  [phone] AND  PLEASE NOTIFY NUCLEAR MEDICINE AT [facility] AT LEAST 24 HOURS IN ADVANCE IF YOU ARE UNABLE TO KEEP YOUR APPOINTMENT. [phone]  How to prepare for your Myoview test:  1. Do not eat or drink after midnight 2. No caffeine for 24 hours prior to test 3. No smoking 24 hours prior to test. 4. Your medication may be  taken with water.  If your doctor stopped a medication because of this test, do not take that medication. 5. Ladies, please do not wear dresses.  Skirts or pants are appropriate. Please wear a short sleeve shirt. 6. No perfume, cologne or lotion  Call or return to clinic prn if these symptoms worsen or fail to improve as anticipated.

## 2014-02-12 NOTE — Assessment & Plan Note (Signed)
The chest pain is overall atypical. However, she reports associated exertional dyspnea and EKG showed new left bundle branch block of unclear duration.  I recommend evaluation with a pharmacologic nuclear stress test.

## 2014-02-15 ENCOUNTER — Other Ambulatory Visit: Payer: Self-pay | Admitting: Internal Medicine

## 2014-02-15 DIAGNOSIS — Z1239 Encounter for other screening for malignant neoplasm of breast: Secondary | ICD-10-CM

## 2014-02-15 DIAGNOSIS — N644 Mastodynia: Secondary | ICD-10-CM

## 2014-02-15 NOTE — Progress Notes (Signed)
Order placed for mammogram and left breast ultrasound.   

## 2014-02-19 ENCOUNTER — Ambulatory Visit: Payer: Self-pay | Admitting: Cardiovascular Disease

## 2014-02-19 DIAGNOSIS — R079 Chest pain, unspecified: Secondary | ICD-10-CM

## 2014-02-21 ENCOUNTER — Other Ambulatory Visit: Payer: Self-pay | Admitting: *Deleted

## 2014-02-21 ENCOUNTER — Other Ambulatory Visit: Payer: Self-pay

## 2014-02-21 DIAGNOSIS — R079 Chest pain, unspecified: Secondary | ICD-10-CM

## 2014-02-21 MED ORDER — LEVOTHYROXINE SODIUM 75 MCG PO TABS: 75.0000 ug | ORAL_TABLET | Freq: Every day | ORAL | Status: DC

## 2014-02-26 ENCOUNTER — Ambulatory Visit: Payer: Self-pay | Admitting: Internal Medicine

## 2014-02-26 DIAGNOSIS — R928 Other abnormal and inconclusive findings on diagnostic imaging of breast: Secondary | ICD-10-CM | POA: Diagnosis not present

## 2014-02-26 DIAGNOSIS — N644 Mastodynia: Secondary | ICD-10-CM | POA: Diagnosis not present

## 2014-02-26 LAB — HM MAMMOGRAPHY: HM MAMMO: NEGATIVE

## 2014-02-27 ENCOUNTER — Other Ambulatory Visit: Payer: Self-pay | Admitting: *Deleted

## 2014-02-27 ENCOUNTER — Other Ambulatory Visit: Payer: Self-pay

## 2014-02-27 ENCOUNTER — Other Ambulatory Visit (INDEPENDENT_AMBULATORY_CARE_PROVIDER_SITE_OTHER): Payer: Medicare Other

## 2014-02-27 DIAGNOSIS — R011 Cardiac murmur, unspecified: Secondary | ICD-10-CM

## 2014-02-27 DIAGNOSIS — R079 Chest pain, unspecified: Secondary | ICD-10-CM

## 2014-02-27 MED ORDER — PRAVASTATIN SODIUM 20 MG PO TABS: 20.0000 mg | ORAL_TABLET | Freq: Every day | ORAL | Status: DC

## 2014-03-19 ENCOUNTER — Encounter: Payer: Self-pay | Admitting: Internal Medicine

## 2014-03-19 ENCOUNTER — Ambulatory Visit (INDEPENDENT_AMBULATORY_CARE_PROVIDER_SITE_OTHER): Payer: Medicare Other | Admitting: Internal Medicine

## 2014-03-19 VITALS — BP 136/84 | HR 82 | Temp 97.8°F | Ht 60.0 in | Wt 141.0 lb

## 2014-03-19 DIAGNOSIS — J329 Chronic sinusitis, unspecified: Secondary | ICD-10-CM

## 2014-03-19 DIAGNOSIS — R011 Cardiac murmur, unspecified: Secondary | ICD-10-CM

## 2014-03-19 DIAGNOSIS — I739 Peripheral vascular disease, unspecified: Secondary | ICD-10-CM

## 2014-03-19 DIAGNOSIS — E039 Hypothyroidism, unspecified: Secondary | ICD-10-CM

## 2014-03-19 DIAGNOSIS — I701 Atherosclerosis of renal artery: Secondary | ICD-10-CM

## 2014-03-19 DIAGNOSIS — M169 Osteoarthritis of hip, unspecified: Secondary | ICD-10-CM | POA: Diagnosis not present

## 2014-03-19 DIAGNOSIS — E7800 Pure hypercholesterolemia, unspecified: Secondary | ICD-10-CM

## 2014-03-19 DIAGNOSIS — I10 Essential (primary) hypertension: Secondary | ICD-10-CM

## 2014-03-19 DIAGNOSIS — M810 Age-related osteoporosis without current pathological fracture: Secondary | ICD-10-CM | POA: Diagnosis not present

## 2014-03-19 DIAGNOSIS — K219 Gastro-esophageal reflux disease without esophagitis: Secondary | ICD-10-CM | POA: Diagnosis not present

## 2014-03-19 DIAGNOSIS — I779 Disorder of arteries and arterioles, unspecified: Secondary | ICD-10-CM | POA: Diagnosis not present

## 2014-03-19 DIAGNOSIS — R0982 Postnasal drip: Secondary | ICD-10-CM

## 2014-03-19 MED ORDER — FLUTICASONE PROPIONATE 50 MCG/ACT NA SUSP: 2.0000 | Freq: Every day | NASAL | Status: DC

## 2014-03-19 NOTE — Progress Notes (Signed)
Pre visit review using our clinic review tool, if applicable. No additional management support is needed unless otherwise documented below in the visit note. 

## 2014-03-23 ENCOUNTER — Encounter: Payer: Self-pay | Admitting: Internal Medicine

## 2014-03-23 DIAGNOSIS — R0982 Postnasal drip: Secondary | ICD-10-CM | POA: Insufficient documentation

## 2014-03-23 NOTE — Assessment & Plan Note (Signed)
On thyroid replacement.  Follow tsh.  

## 2014-03-23 NOTE — Assessment & Plan Note (Signed)
Low cholesterol diet and exercise.  On pravastatin.  Follow lipid panel and liver function tests.   

## 2014-03-23 NOTE — Assessment & Plan Note (Signed)
Has tried fosamax, boniva, reclast and Forteo.  Two spontaneous fractures.  Evaluated by Dr Jefm Bryant.  Follow vitamin D level.  Recheck with next labs.

## 2014-03-23 NOTE — Assessment & Plan Note (Signed)
On omeprazole.  Follow.

## 2014-03-23 NOTE — Assessment & Plan Note (Signed)
Sees Dr Margaretmary Eddy.  Stable.  Follow.

## 2014-03-23 NOTE — Progress Notes (Signed)
Patient ID: Julia Patterson, female   DOB: [birth year], 81 y.o.   MRN: [id number]   Subjective:    Patient ID: Julia Patterson, female    DOB: 02-Jun-1929, 81 y.o.   MRN: [id number]  HPI  Patient here for a scheduled follow up.  Last visit was concerned regarding chest discomfort.  See last note for details.  Cardiac w/up unrevealing.  Feels better.  Breathing stable.  No chest pain.  Eating and drinking well.  Bowels stable.  Still with some post nasal drainage.  No increased cough.     Past Medical History  Diagnosis Date  . GERD (gastroesophageal reflux disease)   . Hypothyroidism   . Hypercholesterolemia   . Urinary incontinence   . Osteoporosis     s/p fosamax, boniva, reclast, forteo.  bilateral spontaneous femur fractures  . Recurrent cystitis   . Varicose veins     chronic venous insufficiency  . Degenerative arthritis     lumbar spine.  spondylolisthesis, hip and leg pain, scoliosis  . Carotid artery occlusion     Current Outpatient Prescriptions on File Prior to Visit  Medication Sig Dispense Refill  . acetaminophen (TYLENOL 8 HOUR) 650 MG CR tablet Take 650 mg by mouth 2 (two) times daily.    Marland Kitchen aspirin 81 MG tablet Take 81 mg by mouth daily.    . Calcium Citrate-Vitamin D (CITRACAL + D PO) Take by mouth 3 (three) times daily.    . cholecalciferol (VITAMIN D) 1000 UNITS tablet Take 1,000 Units by mouth 2 (two) times daily.    . Coenzyme Q10 (COQ10) 100 MG CAPS Take 1 or 2 times daily as needed    . levothyroxine (SYNTHROID, LEVOTHROID) 75 MCG tablet Take 1 tablet (75 mcg total) by mouth daily. 90 tablet 1  . Multiple Vitamin (MULTIVITAMIN) tablet Take 1 tablet by mouth daily.    . Naproxen Sodium (ALEVE) 220 MG CAPS Take by mouth as needed. Take two by mouth once a day    . omeprazole (PRILOSEC) 20 MG capsule Take 1 capsule (20 mg total) by mouth daily. 90 capsule 1  . Potassium Gluconate 595 MG CAPS Take by mouth daily.    . pravastatin (PRAVACHOL) 20 MG  tablet Take 1 tablet (20 mg total) by mouth daily. 90 tablet 1  . tolterodine (DETROL LA) 4 MG 24 hr capsule Take 4 mg by mouth daily.    Marland Kitchen triamcinolone cream (KENALOG) 0.1 % Apply 1 application topically 2 (two) times daily. Do not use for more than 7-10 days in the same area. 30 g 0   No current facility-administered medications on file prior to visit.    Review of Systems  Constitutional: Positive for fatigue (does repor some fatigue. ). Negative for unexpected weight change.  HENT: Positive for postnasal drip. Negative for congestion and sinus pressure.   Respiratory: Negative for cough, chest tightness and shortness of breath.   Cardiovascular: Negative for chest pain, palpitations and leg swelling.  Gastrointestinal: Negative for nausea, vomiting, abdominal pain, diarrhea and constipation.  Neurological: Negative for dizziness, light-headedness and headaches.       Objective:    Physical Exam  HENT:  Nose: Nose normal.  Mouth/Throat: Oropharynx is clear and moist.  Neck: Neck supple. No thyromegaly present.  Cardiovascular: Normal rate and regular rhythm.   Murmur (2/6 systolic murmur) heard. Pulmonary/Chest: Breath sounds normal. No respiratory distress. She has no wheezes.  Abdominal: Soft. Bowel sounds are normal. There is no tenderness.  Musculoskeletal:  She exhibits no edema or tenderness.  Lymphadenopathy:    She has no cervical adenopathy.    BP 136/84 mmHg  Pulse 82  Temp(Src) 97.8 F (36.6 C) (Oral)  Ht 5' (1.524 m)  Wt 141 lb (63.957 kg)  BMI 27.54 kg/m2  SpO2 94% Wt Readings from Last 3 Encounters:  03/19/14 141 lb (63.957 kg)  02/12/14 138 lb 12 oz (62.937 kg)  02/11/14 139 lb 12 oz (63.39 kg)     Lab Results  Component Value Date   WBC 6.4 06/19/2013   HGB 15.0 06/19/2013   HCT 44.3 06/19/2013   PLT 254.0 06/19/2013   GLUCOSE 89 11/22/2013   CHOL 209* 11/22/2013   TRIG 183.0* 11/22/2013   HDL 39.60 11/22/2013   LDLCALC 133* 11/22/2013    ALT 17 11/22/2013   AST 20 11/22/2013   NA 140 11/22/2013   K 4.4 11/22/2013   CL 107 11/22/2013   CREATININE 1.0 11/22/2013   BUN 25* 11/22/2013   CO2 27 11/22/2013   TSH 0.49 08/02/2013       Assessment & Plan:   Problem List Items Addressed This Visit    Cardiac murmur    ECHO just obtained and revealed EF 48% with tricuspid regurgitation and mild aortic sclerosis.  Symptoms improved.  Follow.        Carotid artery disease    Followed by Dr Lucky Cowboy.  See overview for carotid ultrasound results.        Degenerative arthritis    Sees Dr Margaretmary Eddy.  Stable.  Follow.        Essential hypertension, benign    Blood pressure doing well.  Same medication regimen.  Follow met b.       Relevant Orders   Basic metabolic panel   GERD (gastroesophageal reflux disease)    On omeprazole.  Follow.       Hypercholesterolemia - Primary    Low cholesterol diet and exercise.  On pravastatin.  Follow lipid panel and liver function tests.        Relevant Orders   Lipid panel   Hepatic function panel   Hypothyroidism    On thyroid replacement.  Follow tsh.        Relevant Orders   TSH   Osteoporosis    Has tried fosamax, boniva, reclast and Forteo.  Two spontaneous fractures.  Evaluated by Dr Jefm Bryant.  Follow vitamin D level.  Recheck with next labs.        Relevant Orders   Vit D  25 hydroxy (rtn osteoporosis monitoring)   Post-nasal drainage    Continue flonase.  She wants to try cutting back on chocolate, etc.  Follow.        Renal artery stenosis    Seeing vascular surgery.  Renal artery duplex as outlined.  Continue f/u with vascular.          I spent 25 minutes with the patient and more than 50% of the time was spent in consultation regarding the above.     Einar Pheasant, MD

## 2014-03-23 NOTE — Assessment & Plan Note (Signed)
Followed by Dr Lucky Cowboy.  See overview for carotid ultrasound results.

## 2014-03-23 NOTE — Assessment & Plan Note (Signed)
ECHO just obtained and revealed EF 48% with tricuspid regurgitation and mild aortic sclerosis.  Symptoms improved.  Follow.

## 2014-03-23 NOTE — Assessment & Plan Note (Signed)
Continue flonase.  She wants to try cutting back on chocolate, etc.  Follow.

## 2014-03-23 NOTE — Assessment & Plan Note (Signed)
Blood pressure doing well.  Same medication regimen.  Follow met b  

## 2014-03-23 NOTE — Assessment & Plan Note (Signed)
Seeing vascular surgery.  Renal artery duplex as outlined.  Continue f/u with vascular.

## 2014-03-24 DIAGNOSIS — S8980XA Other specified injuries of unspecified lower leg, initial encounter: Secondary | ICD-10-CM | POA: Diagnosis not present

## 2014-03-26 DIAGNOSIS — S81819A Laceration without foreign body, unspecified lower leg, initial encounter: Secondary | ICD-10-CM | POA: Diagnosis not present

## 2014-03-27 ENCOUNTER — Encounter: Payer: Self-pay | Admitting: *Deleted

## 2014-03-27 ENCOUNTER — Other Ambulatory Visit (INDEPENDENT_AMBULATORY_CARE_PROVIDER_SITE_OTHER): Payer: Medicare Other

## 2014-03-27 DIAGNOSIS — E039 Hypothyroidism, unspecified: Secondary | ICD-10-CM | POA: Diagnosis not present

## 2014-03-27 DIAGNOSIS — M810 Age-related osteoporosis without current pathological fracture: Secondary | ICD-10-CM | POA: Diagnosis not present

## 2014-03-27 DIAGNOSIS — E7800 Pure hypercholesterolemia, unspecified: Secondary | ICD-10-CM

## 2014-03-27 DIAGNOSIS — I10 Essential (primary) hypertension: Secondary | ICD-10-CM

## 2014-03-27 LAB — LIPID PANEL
CHOLESTEROL: 173 mg/dL (ref 0–200)
HDL: 44.6 mg/dL (ref >39.00)
LDL Cholesterol: 103 mg/dL — ABNORMAL HIGH (ref 0–99)
NONHDL: 128.4
Total CHOL/HDL Ratio: 4
Triglycerides: 127 mg/dL (ref 0.0–149.0)
VLDL: 25.4 mg/dL (ref 0.0–40.0)

## 2014-03-27 LAB — BASIC METABOLIC PANEL
BUN: 23 mg/dL (ref 6–23)
CALCIUM: 10.1 mg/dL (ref 8.4–10.5)
CO2: 29 meq/L (ref 19–32)
CREATININE: 1.04 mg/dL (ref 0.40–1.20)
Chloride: 107 mEq/L (ref 96–112)
GFR: 53.88 mL/min — AB (ref >60.00)
GLUCOSE: 82 mg/dL (ref 70–99)
Potassium: 4.2 mEq/L (ref 3.5–5.1)
SODIUM: 141 meq/L (ref 135–145)

## 2014-03-27 LAB — HEPATIC FUNCTION PANEL
ALBUMIN: 4 g/dL (ref 3.5–5.2)
ALT: 16 U/L (ref 0–35)
AST: 19 U/L (ref 0–37)
Alkaline Phosphatase: 47 U/L (ref 39–117)
Bilirubin, Direct: 0.1 mg/dL (ref 0.0–0.3)
TOTAL PROTEIN: 6.7 g/dL (ref 6.0–8.3)
Total Bilirubin: 0.5 mg/dL (ref 0.2–1.2)

## 2014-03-27 LAB — VITAMIN D 25 HYDROXY (VIT D DEFICIENCY, FRACTURES): VITD: 37.84 ng/mL (ref 30.00–100.00)

## 2014-03-27 LAB — TSH: TSH: 4.12 u[IU]/mL (ref 0.35–4.50)

## 2014-04-08 DIAGNOSIS — E669 Obesity, unspecified: Secondary | ICD-10-CM | POA: Diagnosis not present

## 2014-04-08 DIAGNOSIS — I6529 Occlusion and stenosis of unspecified carotid artery: Secondary | ICD-10-CM | POA: Diagnosis not present

## 2014-04-08 DIAGNOSIS — I701 Atherosclerosis of renal artery: Secondary | ICD-10-CM | POA: Diagnosis not present

## 2014-04-08 DIAGNOSIS — E785 Hyperlipidemia, unspecified: Secondary | ICD-10-CM | POA: Diagnosis not present

## 2014-04-11 ENCOUNTER — Ambulatory Visit (INDEPENDENT_AMBULATORY_CARE_PROVIDER_SITE_OTHER): Payer: Medicare Other | Admitting: Internal Medicine

## 2014-04-11 ENCOUNTER — Telehealth: Payer: Self-pay | Admitting: Internal Medicine

## 2014-04-11 ENCOUNTER — Ambulatory Visit: Payer: Self-pay | Admitting: Internal Medicine

## 2014-04-11 ENCOUNTER — Encounter: Payer: Self-pay | Admitting: Internal Medicine

## 2014-04-11 VITALS — BP 130/70 | HR 87 | Temp 98.4°F | Ht 60.0 in | Wt 141.0 lb

## 2014-04-11 DIAGNOSIS — R0602 Shortness of breath: Secondary | ICD-10-CM | POA: Diagnosis not present

## 2014-04-11 DIAGNOSIS — H109 Unspecified conjunctivitis: Secondary | ICD-10-CM

## 2014-04-11 DIAGNOSIS — K449 Diaphragmatic hernia without obstruction or gangrene: Secondary | ICD-10-CM | POA: Diagnosis not present

## 2014-04-11 DIAGNOSIS — I701 Atherosclerosis of renal artery: Secondary | ICD-10-CM | POA: Diagnosis not present

## 2014-04-11 DIAGNOSIS — J984 Other disorders of lung: Secondary | ICD-10-CM | POA: Diagnosis not present

## 2014-04-11 MED ORDER — LEVOFLOXACIN 0.5 % OP SOLN: 1.0000 [drp] | Freq: Four times a day (QID) | OPHTHALMIC | Status: DC

## 2014-04-11 MED ORDER — CIPROFLOXACIN HCL 0.3 % OP SOLN: 1.0000 [drp] | Freq: Four times a day (QID) | OPHTHALMIC | Status: DC

## 2014-04-11 NOTE — Telephone Encounter (Signed)
Patient stated that they did not have the medication at [facility], called another [facility] they didn't have it there either, they will not have it until Monday. She asks if their is another alternative. And what is the name of the medication? Please advise

## 2014-04-11 NOTE — Telephone Encounter (Signed)
Please call and see what equivalent they have available.  (quesiton of erythromycin drops).

## 2014-04-11 NOTE — Telephone Encounter (Signed)
Spoke with [facility] @ [facility] on [address] pharmacist recommeded changing to ciprofloxacin 0.3% solution. Rx was changed over the phone & pt notified (chart updated)

## 2014-04-11 NOTE — Telephone Encounter (Signed)
Pt notified. appt scheduled.

## 2014-04-11 NOTE — Telephone Encounter (Signed)
Please advise (triage call)

## 2014-04-11 NOTE — Telephone Encounter (Signed)
Patient stated that she noticed last night her eyes was itchy, and this morning it was red and crusty. Please advise [phone]

## 2014-04-11 NOTE — Telephone Encounter (Signed)
Can she come in at 1:00 for eye evaluation.  Make sure no vision loss or pain.  If pain, vision loss or other more concerning symptoms - let me know and I will see if ophthalmology will see her.

## 2014-04-11 NOTE — Progress Notes (Signed)
Pre visit review using our clinic review tool, if applicable. No additional management support is needed unless otherwise documented below in the visit note. 

## 2014-04-13 ENCOUNTER — Encounter: Payer: Self-pay | Admitting: Internal Medicine

## 2014-04-13 DIAGNOSIS — H109 Unspecified conjunctivitis: Secondary | ICD-10-CM | POA: Insufficient documentation

## 2014-04-13 DIAGNOSIS — R0602 Shortness of breath: Secondary | ICD-10-CM | POA: Insufficient documentation

## 2014-04-13 NOTE — Assessment & Plan Note (Signed)
Involves right eye.  With the crusting, etc, will cover for bacterial infection.  Treat with quixin eye drops as directed.  Follow.  Notify me or be reevaluated if persistent or change in symptoms.

## 2014-04-13 NOTE — Assessment & Plan Note (Signed)
Just had cardiac w/up.  Will check cxr.  Further w/up pending results.

## 2014-04-13 NOTE — Progress Notes (Signed)
Patient ID: Julia Patterson, female   DOB: [birth year], 81 y.o.   MRN: [id number]   Subjective:    Patient ID: Julia Patterson, female    DOB: 1929-06-01, 81 y.o.   MRN: [id number]  HPI  Patient here as a work in with concerns regarding an eye infection.  States that her eye started watering yesterday.  Woke this am with crusting and redness - right eye.  No vision change.  No fever.  No pain in the eye.  No sinus pressure.  No sore throat.  No cough or congestion.  She still reports being sob with exertion.  Just had cardiac w/up.  See previous note.     Past Medical History  Diagnosis Date  . GERD (gastroesophageal reflux disease)   . Hypothyroidism   . Hypercholesterolemia   . Urinary incontinence   . Osteoporosis     s/p fosamax, boniva, reclast, forteo.  bilateral spontaneous femur fractures  . Recurrent cystitis   . Varicose veins     chronic venous insufficiency  . Degenerative arthritis     lumbar spine.  spondylolisthesis, hip and leg pain, scoliosis  . Carotid artery occlusion     Current Outpatient Prescriptions on File Prior to Visit  Medication Sig Dispense Refill  . acetaminophen (TYLENOL 8 HOUR) 650 MG CR tablet Take 650 mg by mouth 2 (two) times daily.    Marland Kitchen aspirin 81 MG tablet Take 81 mg by mouth daily.    . Calcium Citrate-Vitamin D (CITRACAL + D PO) Take by mouth 3 (three) times daily.    . cholecalciferol (VITAMIN D) 1000 UNITS tablet Take 1,000 Units by mouth 2 (two) times daily.    . Coenzyme Q10 (COQ10) 100 MG CAPS Take 1 or 2 times daily as needed    . fluticasone (FLONASE) 50 MCG/ACT nasal spray Place 2 sprays into both nostrils daily. 48 g 3  . levothyroxine (SYNTHROID, LEVOTHROID) 75 MCG tablet Take 1 tablet (75 mcg total) by mouth daily. 90 tablet 1  . Multiple Vitamin (MULTIVITAMIN) tablet Take 1 tablet by mouth daily.    . Naproxen Sodium (ALEVE) 220 MG CAPS Take by mouth as needed. Take two by mouth once a day    . omeprazole (PRILOSEC) 20  MG capsule Take 1 capsule (20 mg total) by mouth daily. 90 capsule 1  . Potassium Gluconate 595 MG CAPS Take by mouth daily.    . pravastatin (PRAVACHOL) 20 MG tablet Take 1 tablet (20 mg total) by mouth daily. 90 tablet 1  . tolterodine (DETROL LA) 4 MG 24 hr capsule Take 4 mg by mouth daily.    Marland Kitchen triamcinolone cream (KENALOG) 0.1 % Apply 1 application topically 2 (two) times daily. Do not use for more than 7-10 days in the same area. 30 g 0   No current facility-administered medications on file prior to visit.    Review of Systems  Constitutional: Negative for appetite change and unexpected weight change.  HENT: Negative for congestion and sinus pressure.   Eyes: Positive for discharge (eye crusting.  ) and redness. Negative for photophobia, pain and visual disturbance.  Respiratory: Positive for shortness of breath (does rerport sob with exertion.). Negative for cough.   Cardiovascular: Negative for chest pain, palpitations and leg swelling.  Gastrointestinal: Negative for nausea, vomiting, abdominal pain and diarrhea.       Objective:    Physical Exam  HENT:  Nose: Nose normal.  Mouth/Throat: Oropharynx is clear and moist.  Eyes: Pupils  are equal, round, and reactive to light.  Right conjunctiva - erythema.  No pain to palpation over the orbit.  No pain in the peri orbital region to palpation.   Neck: Neck supple. No thyromegaly present.  Cardiovascular: Normal rate and regular rhythm.   Pulmonary/Chest: Breath sounds normal. No respiratory distress. She has no wheezes.  Lymphadenopathy:    She has no cervical adenopathy.    BP 130/70 mmHg  Pulse 87  Temp(Src) 98.4 F (36.9 C) (Oral)  Ht 5' (1.524 m)  Wt 141 lb (63.957 kg)  BMI 27.54 kg/m2  SpO2 93% Wt Readings from Last 3 Encounters:  04/11/14 141 lb (63.957 kg)  03/19/14 141 lb (63.957 kg)  02/12/14 138 lb 12 oz (62.937 kg)     Lab Results  Component Value Date   WBC 6.4 06/19/2013   HGB 15.0 06/19/2013    HCT 44.3 06/19/2013   PLT 254.0 06/19/2013   GLUCOSE 82 03/27/2014   CHOL 173 03/27/2014   TRIG 127.0 03/27/2014   HDL 44.60 03/27/2014   LDLCALC 103* 03/27/2014   ALT 16 03/27/2014   AST 19 03/27/2014   NA 141 03/27/2014   K 4.2 03/27/2014   CL 107 03/27/2014   CREATININE 1.04 03/27/2014   BUN 23 03/27/2014   CO2 29 03/27/2014   TSH 4.12 03/27/2014       Assessment & Plan:   Problem List Items Addressed This Visit    Conjunctivitis - Primary    Involves right eye.  With the crusting, etc, will cover for bacterial infection.  Treat with quixin eye drops as directed.  Follow.  Notify me or be reevaluated if persistent or change in symptoms.        SOB (shortness of breath)    Just had cardiac w/up.  Will check cxr.  Further w/up pending results.            Einar Pheasant, MD

## 2014-04-17 ENCOUNTER — Telehealth: Payer: Self-pay | Admitting: Internal Medicine

## 2014-04-17 DIAGNOSIS — R0602 Shortness of breath: Secondary | ICD-10-CM

## 2014-04-17 NOTE — Telephone Encounter (Signed)
Pt notified or results & agreeable to Pulmonology referral

## 2014-04-17 NOTE — Telephone Encounter (Signed)
Order placed for pulmonary referral.  

## 2014-04-17 NOTE — Telephone Encounter (Signed)
Notify pt that her cxr reveals a large hiatal hernia with some hyperinflation of the lungs.  Sometimes a large hiatal hernia can contribute to sob.  Given her worsening sob, I would like to refer her to pulmonary for further evaluation and further lung testing.  If agreeable, let me know and I will place the order for the referral.

## 2014-04-22 ENCOUNTER — Ambulatory Visit: Payer: Self-pay | Admitting: Urology

## 2014-04-22 DIAGNOSIS — R32 Unspecified urinary incontinence: Secondary | ICD-10-CM | POA: Diagnosis not present

## 2014-04-22 DIAGNOSIS — N819 Female genital prolapse, unspecified: Secondary | ICD-10-CM | POA: Diagnosis not present

## 2014-04-22 DIAGNOSIS — N200 Calculus of kidney: Secondary | ICD-10-CM | POA: Diagnosis not present

## 2014-04-22 DIAGNOSIS — N3020 Other chronic cystitis without hematuria: Secondary | ICD-10-CM | POA: Diagnosis not present

## 2014-04-29 ENCOUNTER — Ambulatory Visit (INDEPENDENT_AMBULATORY_CARE_PROVIDER_SITE_OTHER): Payer: Medicare Other | Admitting: Internal Medicine

## 2014-04-29 ENCOUNTER — Encounter: Payer: Self-pay | Admitting: Internal Medicine

## 2014-04-29 VITALS — BP 126/80 | HR 90 | Temp 97.6°F | Ht 58.3 in | Wt 139.8 lb

## 2014-04-29 DIAGNOSIS — R0600 Dyspnea, unspecified: Secondary | ICD-10-CM

## 2014-04-29 DIAGNOSIS — I701 Atherosclerosis of renal artery: Secondary | ICD-10-CM | POA: Diagnosis not present

## 2014-04-29 DIAGNOSIS — R0602 Shortness of breath: Secondary | ICD-10-CM

## 2014-04-29 DIAGNOSIS — R5383 Other fatigue: Secondary | ICD-10-CM | POA: Diagnosis not present

## 2014-04-29 NOTE — Patient Instructions (Signed)
Follow up with Dr. Stevenson Clinch in 2 weeks. - stop flonase for 1 week, if no improvement in breathing then resume flonase - pulmonary function test and 6 minute walk test prior to next visit - given your symptoms of fatigue, muscle weakness, and increased lethargy we will make a referral for you to see an Endocrinologist to further evaluate your thyroid function.  Dr. Bynum Bellows [facility] Endocrinology - continue your exercise as tolerated

## 2014-04-29 NOTE — Progress Notes (Signed)
Date: 04/29/2014  MRN# [id number] Julia Patterson 05-06-1929  Referring Physician:   JORDI KAMM is a 81 y.o. old female seen in consultation for shortness of breath  CC:  Chief Complaint  Patient presents with  . Advice Only    Dr. Nicki Reaper referred for sob. Pt says this has been going on for 1 month but gotten worse over the past week. Pt has a little chest tightness and hoarseness but denies wheezing and cough.     HPI:  Patient is a pleasant 81 year old female presents today for complaint of dyspnea 1 month. History is as follows, she cannot state any inciting factors that occurred a month ago, she has no pets, no history of smoking, no sick contacts, no travel.  she states that dyspnea was gradual in onset, accompanied with some mild leg swelling, she was placed on Flonase, she also endorses increased fatigue. Patient states that one month ago she didn't climb about 15 steps, his exercise, not is very difficult with moderate to severe dyspnea on exertion, also accompanied with increased weakness. Although she does not have any first-degree smoking history, she does have significant second hand smoke exposure from birth about 81 years old from appearance, and then throughout most of her marriage secondhand smoke exposure from her husband. Patient has had a cardiac workup with nuclear stress test, echo, chest x-ray. Recent chest x-ray showed large hiatal hernia. Patient states that she has a history of hypothyroidism, although she does have increased weakness, increased fatigue, she is taking her prescribed medication on a regular basis. However, she cannot recall last time she had thyroid function studies performed.  PMHX:   Past Medical History  Diagnosis Date  . GERD (gastroesophageal reflux disease)   . Hypothyroidism   . Hypercholesterolemia   . Urinary incontinence   . Osteoporosis     s/p fosamax, boniva, reclast, forteo.  bilateral spontaneous femur fractures  .  Recurrent cystitis   . Varicose veins     chronic venous insufficiency  . Degenerative arthritis     lumbar spine.  spondylolisthesis, hip and leg pain, scoliosis  . Carotid artery occlusion    Surgical Hx:  Past Surgical History  Procedure Laterality Date  . Tonsillectomy and adenoidectomy  1950  . Varicose vein ligation  1972  . Uterus suspension and sterilization  1963  . Breast reduction surgery  1987  . Hysterectomy and bladder repair  1998  . Varicose vein sclerotherapy      Dr Hulda Humphrey  . L5-l6 fusion  2/07  . Trigger finger repair      right fourth   Family Hx:  Family History  Problem Relation Age of Onset  . Breast cancer Daughter   . Rheumatic fever Mother     died age 27  . Heart disease      father's family  . Diabetes      father's family  . Heart attack Father    Social Hx:   History  Substance Use Topics  . Smoking status: Never Smoker   . Smokeless tobacco: Never Used  . Alcohol Use: No   Medication:   Current Outpatient Rx  Name  Route  Sig  Dispense  Refill  . acetaminophen (TYLENOL 8 HOUR) 650 MG CR tablet   Oral   Take 650 mg by mouth 2 (two) times daily.         Marland Kitchen aspirin 81 MG tablet   Oral   Take 81 mg by mouth daily.         Marland Kitchen  Calcium Citrate-Vitamin D (CITRACAL + D PO)   Oral   Take by mouth 3 (three) times daily.         . cholecalciferol (VITAMIN D) 1000 UNITS tablet   Oral   Take 1,000 Units by mouth 2 (two) times daily.         . Coenzyme Q10 (COQ10) 100 MG CAPS      Take 1 or 2 times daily as needed         . fluticasone (FLONASE) 50 MCG/ACT nasal spray   Each Nare   Place 2 sprays into both nostrils daily.   48 g   3   . levothyroxine (SYNTHROID, LEVOTHROID) 75 MCG tablet   Oral   Take 1 tablet (75 mcg total) by mouth daily.   90 tablet   1   . Melatonin 3 MG TABS   Oral   Take 3 mg by mouth at bedtime as needed.         . Multiple Vitamin (MULTIVITAMIN) tablet   Oral   Take 1 tablet by mouth  daily.         . Naproxen Sodium (ALEVE) 220 MG CAPS   Oral   Take by mouth as needed. Take two by mouth once a day         . omeprazole (PRILOSEC) 20 MG capsule   Oral   Take 1 capsule (20 mg total) by mouth daily.   90 capsule   1     Please hold until pt is due for another refill   . Potassium Gluconate 595 MG CAPS   Oral   Take by mouth daily.         . pravastatin (PRAVACHOL) 20 MG tablet   Oral   Take 1 tablet (20 mg total) by mouth daily.   90 tablet   1   . tolterodine (DETROL LA) 4 MG 24 hr capsule   Oral   Take 4 mg by mouth daily.             Allergies:  Sulfa antibiotics and Thimerosal  Review of Systems: Gen:  Denies  fever, sweats, chills HEENT: Denies blurred vision, double vision, ear pain, eye pain, hearing loss, nose bleeds, sore throat Cvc:  No dizziness, chest pain or heaviness Resp:   Dyspnea on exertion, dyspnea at rest. Gi: Denies swallowing difficulty, stomach pain, nausea or vomiting, diarrhea, constipation, bowel incontinence Gu:  Denies bladder incontinence, burning urine Ext:   No Joint pain, stiffness or swelling Skin: No skin rash, easy bruising or bleeding or hives Endoc:  No polyuria, polydipsia , polyphagia or weight change Psych: No depression, insomnia or hallucinations  Other:  All other systems negative  Physical Examination:   VS: BP 126/80 mmHg  Pulse 90  Temp(Src) 97.6 F (36.4 C) (Oral)  Ht 4' 10.3" (1.481 m)  Wt 139 lb 12.8 oz (63.413 kg)  BMI 28.91 kg/m2  SpO2 93%  General Appearance: No distress  Neuro:without focal findings, mental status, speech normal, alert and oriented, cranial nerves 2-12 intact, reflexes normal and symmetric, sensation grossly normal  HEENT: PERRLA, EOM intact, no ptosis, no other lesions noticed; Mallampati 2 Pulmonary: normal breath sounds., diaphragmatic excursion normal.No wheezing, No rales;   Sputum Production:  none CardiovascularNormal S1,S2.  No m/r/g.  Abdominal aorta  pulsation normal.    Abdomen: Benign, Soft, non-tender, No masses, hepatosplenomegaly, No lymphadenopathy Renal:  No costovertebral tenderness  GU:  No performed at this time. Endoc: No evident  thyromegaly, no signs of acromegaly or Cushing features Skin:   warm, no rashes, no ecchymosis  Extremities: normal, no cyanosis, clubbing, no edema, warm with normal capillary refill. Other findings: None    Rad results: (The following images and results were reviewed by Dr. Stevenson Clinch). 04/22/14 ABDOMEN - 1 VIEW  COMPARISON: None.  FINDINGS: Soft tissue structures are unremarkable. Gas pattern is nonspecific. Stool noted throughout the colon. Calcific density noted over the left kidney is unchanged. Aortic vascular calcification. Pelvic phleboliths appear stable. Postsurgical changes lower lumbar spine. Diffuse degenerative changes and scoliosis lumbar spine. Degenerative changes both hips. Postsurgical changes both femurs.  IMPRESSION: Calcific density noted projected left kidney is unchanged. No acute abnormality. No evidence of urolithiasis.  04/22/14 EXAM: CHEST 2 VIEW  COMPARISON: 01/09/2010 and earlier.  FINDINGS: Large retrocardiac hiatal hernia containing air and fluid. Other mediastinal contours are within normal limits. Large lung volumes. No pneumothorax or pulmonary edema. No pleural effusion or consolidation. Visualized tracheal air column is within normal limits. No acute osseous abnormality identified. Calcified atherosclerosis of the aorta.  IMPRESSION: Large hiatal hernia and pulmonary hyperinflation. No acute cardiopulmonary abnormality.  ECHO 02/2014 - Left ventricle: The cavity size was normal. Systolic function was normal. The estimated ejection fraction was in the range of 60% to 65%. Wall motion was normal; there were no regional wall motion abnormalities. Doppler parameters are consistent with abnormal left ventricular relaxation (grade 1  diastolic dysfunction). - Aortic valve: Valve area (Vmax): 2.55 cm^2. - Mitral valve: Calcified annulus. There was mild regurgitation. Valve area by continuity equation (using LVOT flow): 2.81 cm^2. - Tricuspid valve: There was mild-moderate regurgitation. - Pulmonary arteries: Systolic pressure was mildly elevated. PA peak pressure: 36 mm Hg (S).  Impressions:  - Murmur on exam possibly secondary to tricuspid valve regurgitation. There is mild aortic valve sclerosis, no significant stenosis.  02/19/14 Nuclear Cardiac Stress Test - normal, low risk   Assessment and Plan: 81 year old female seen in consultation for his me on exertion/dyspnea at rest for the past one month, past medical history of osteoarthritis, hyperlipidemia, acid reflux disease and hypothyroidism. SOB (shortness of breath) Shortness of breath multifactorial: Deconditioning, possible obstructive versus restrictive disease, possible hypothyroidism  Plan: - stop flonase for 1 week, if no improvement in breathing then resume flonase - pulmonary function test and 6 minute walk test prior to next visit - given your symptoms of fatigue, muscle weakness, and increased lethargy we will make a referral for you to see an Endocrinologist to further evaluate your thyroid function.  Dr. Bynum Bellows [facility] Endocrinology - continue your exercise as tolerated     Updated Medication List Outpatient Encounter Prescriptions as of 04/29/2014  Medication Sig  . acetaminophen (TYLENOL 8 HOUR) 650 MG CR tablet Take 650 mg by mouth 2 (two) times daily.  Marland Kitchen aspirin 81 MG tablet Take 81 mg by mouth daily.  . Calcium Citrate-Vitamin D (CITRACAL + D PO) Take by mouth 3 (three) times daily.  . cholecalciferol (VITAMIN D) 1000 UNITS tablet Take 1,000 Units by mouth 2 (two) times daily.  . Coenzyme Q10 (COQ10) 100 MG CAPS Take 1 or 2 times daily as needed  . fluticasone (FLONASE) 50 MCG/ACT nasal spray Place 2 sprays into both  nostrils daily.  Marland Kitchen levothyroxine (SYNTHROID, LEVOTHROID) 75 MCG tablet Take 1 tablet (75 mcg total) by mouth daily.  . Melatonin 3 MG TABS Take 3 mg by mouth at bedtime as needed.  . Multiple Vitamin (MULTIVITAMIN) tablet Take 1 tablet  by mouth daily.  . Naproxen Sodium (ALEVE) 220 MG CAPS Take by mouth as needed. Take two by mouth once a day  . omeprazole (PRILOSEC) 20 MG capsule Take 1 capsule (20 mg total) by mouth daily.  . Potassium Gluconate 595 MG CAPS Take by mouth daily.  . pravastatin (PRAVACHOL) 20 MG tablet Take 1 tablet (20 mg total) by mouth daily.  Marland Kitchen tolterodine (DETROL LA) 4 MG 24 hr capsule Take 4 mg by mouth daily.  . [DISCONTINUED] ciprofloxacin (CILOXAN) 0.3 % ophthalmic solution Place 1 drop into the right eye every 6 (six) hours. Administer 1 drop, every 2 hours, while awake, for 2 days. Then 1 drop, every 4 hours, while awake, for the next 5 days. (Patient not taking: Reported on 04/29/2014)  . [DISCONTINUED] triamcinolone cream (KENALOG) 0.1 % Apply 1 application topically 2 (two) times daily. Do not use for more than 7-10 days in the same area. (Patient not taking: Reported on 04/29/2014)    Orders for this visit: No orders of the defined types were placed in this encounter.     Thank  you for the consultation and for allowing [facility] to assist in the care of your patient. Our recommendations are noted above.  Please contact us if we can be of further service.   Vilinda Boehringer, MD [facility] Office Number: [phone]

## 2014-05-08 ENCOUNTER — Encounter: Payer: Self-pay | Admitting: Internal Medicine

## 2014-05-13 NOTE — Assessment & Plan Note (Addendum)
Shortness of breath multifactorial: Deconditioning, possible obstructive versus restrictive disease, possible hypothyroidism  Plan: - stop flonase for 1 week, if no improvement in breathing then resume flonase - pulmonary function test and 6 minute walk test prior to next visit - given your symptoms of fatigue, muscle weakness, and increased lethargy we will make a referral for you to see an Endocrinologist to further evaluate your thyroid function.  Dr. Bynum Bellows [facility] Endocrinology - continue your exercise as tolerated

## 2014-05-15 ENCOUNTER — Encounter: Payer: Self-pay | Admitting: Endocrinology

## 2014-05-15 ENCOUNTER — Other Ambulatory Visit: Payer: Self-pay | Admitting: *Deleted

## 2014-05-15 ENCOUNTER — Ambulatory Visit (INDEPENDENT_AMBULATORY_CARE_PROVIDER_SITE_OTHER): Payer: Medicare Other | Admitting: Endocrinology

## 2014-05-15 VITALS — BP 120/68 | HR 86 | Resp 14 | Ht 60.0 in | Wt 140.5 lb

## 2014-05-15 DIAGNOSIS — E039 Hypothyroidism, unspecified: Secondary | ICD-10-CM | POA: Diagnosis not present

## 2014-05-15 DIAGNOSIS — I701 Atherosclerosis of renal artery: Secondary | ICD-10-CM | POA: Diagnosis not present

## 2014-05-15 MED ORDER — LEVOTHYROXINE SODIUM 75 MCG PO TABS: 75.0000 ug | ORAL_TABLET | Freq: Every day | ORAL | Status: DC

## 2014-05-15 NOTE — Progress Notes (Signed)
Pre visit review using our clinic review tool, if applicable. No additional management support is needed unless otherwise documented below in the visit note. 

## 2014-05-15 NOTE — Patient Instructions (Signed)
Continue current thyroid dose ( levothyroxine ) 75 mcg daily.   Move your calcium/vitamin D/prilosec to lunch time, so that your thyroid medication is better absorbed.   Recheck thyroid levels in 2 months.

## 2014-05-15 NOTE — Assessment & Plan Note (Signed)
Patient with long-standing hypothyroidism, on levothyroxine therapy. she appears clinically euthyroid today. - she does not appear to have a goiter, thyroid nodules, or neck compression symptoms -Discussed with the patient regarding thyroid hormone physiology, symptoms and signs of hypothyroidism.  -Discussed that goal TSH for her would be in the mid-higher end normal range given her age.  - To promote better absorption of the levothyroxine, I have asked her to move her calcium/Omez to lunch time.  - She can continue on current dose of levothyroxine for now.  -Will recheck her thyroid levels in 2 months from now.   -Recent Vitamin D levels are also at goal on current supplementation and don't explain her fatigue/muscle weakness.   -Breathing and SOB are slightly better off the Flonase. Continue to follow back with pulmonary and PCP for further investigation.

## 2014-05-15 NOTE — Progress Notes (Signed)
Reason for visit: Hypothyroidism  HPI  Julia Patterson is a 81 y.o.-year-old female, referred by her pulmonologist, Dr Stevenson Clinch for evaluation for hypothyroidism. Her PCP is Julia Pheasant, MD  Since start of this year the patient has been having some shortness of breath with slight activity along with left sided mild chest pain that could also happen at rest. This is also associated with some fatigue, though the patient verbally denies it at the time of the appointment( she has marked this as a yes in the ROS form). The patient has had a cardiac workup for this and is also following up with her pulmonologist for evaluation. He recommended trial off the Flonase, and the patient reports that her voice is better ( less hoarse) and her breathing is slightly better off the Flonase. She goes back for a 6 min walk test and follow up with pulmonary after that.  Reason for referral is whether hypothyroidism is contributing to this fatigue/deconditioning/muscle weakness.   Patient recalls being diagnosed with hypothyroidism in >20 years following blood work. Most recently, she has been on Levothyroxine 75 mcg. Patient has been taking this medication fasting, with water, separate from breakfast > 30 minutes and takes her pills ( calcium, iron, PPIs, multivitamins) at BF. Denies missing any doses recently. Denies any dose change in levothyroxine recently.   Was on brand Synthroid in the past, but switched to generic levothyroxine due to cost. Denies any recent use of iodine supplements or herbal medications.   I reviewed pt's thyroid tests: Lab Results  Component Value Date   TSH 4.12 03/27/2014   TSH 0.49 08/02/2013   TSH 0.28* 06/19/2013   TSH 0.67 09/12/2012   TSH 0.64 02/28/2012     Review of systems: [ x  ] complains of    [  ] denies [  ] weight gain  [  ] constipation  [ x ] fatigue ?  [  ] dry skin  [  ] cold intolerance [  ] hair loss [  ] noticing any enlargement in size of thyroid [  ]  lumps in neck [  ] dysphagia [  x] change in voice- attributes to Flonase [  ]  SOB with lying down.  she has family history of  thyroid disorders in: son and daughter. No Family history of thyroid cancer.  No history of XRT to head or neck.   She takes Vitamin D daily. Recent D levels are at goal.  Lab Results  Component Value Date   VD25OH 37.84 03/27/2014     I have reviewed the patient's past medical history, family and social history, surgical history, medications and allergies.  Past Medical History  Diagnosis Date  . GERD (gastroesophageal reflux disease)   . Hypothyroidism   . Hypercholesterolemia   . Urinary incontinence   . Osteoporosis     s/p fosamax, boniva, reclast, forteo.  bilateral spontaneous femur fractures  . Recurrent cystitis   . Varicose veins     chronic venous insufficiency  . Degenerative arthritis     lumbar spine.  spondylolisthesis, hip and leg pain, scoliosis  . Carotid artery occlusion    Past Surgical History  Procedure Laterality Date  . Tonsillectomy and adenoidectomy  1950  . Varicose vein ligation  1972  . Uterus suspension and sterilization  1963  . Breast reduction surgery  1987  . Hysterectomy and bladder repair  1998  . Varicose vein sclerotherapy      Dr Hulda Humphrey  .  L5-l6 fusion  2/07  . Trigger finger repair      right fourth   Family History  Problem Relation Age of Onset  . Breast cancer Daughter   . Rheumatic fever Mother     died age 47  . Heart disease      father's family  . Diabetes      father's family  . Heart attack Father    History   Social History  . Marital Status: Divorced    Spouse Name: N/A  . Number of Children: N/A  . Years of Education: N/A   Occupational History  . Not on file.   Social History Main Topics  . Smoking status: Never Smoker   . Smokeless tobacco: Never Used  . Alcohol Use: No  . Drug Use: No  . Sexual Activity: Not on file   Other Topics Concern  . Not on file   Social  History Narrative   Current Outpatient Prescriptions on File Prior to Visit  Medication Sig Dispense Refill  . acetaminophen (TYLENOL 8 HOUR) 650 MG CR tablet Take 650 mg by mouth 2 (two) times daily.    Marland Kitchen aspirin 81 MG tablet Take 81 mg by mouth daily.    . Calcium Citrate-Vitamin D (CITRACAL + D PO) Take by mouth 3 (three) times daily.    . cholecalciferol (VITAMIN D) 1000 UNITS tablet Take 1,000 Units by mouth 2 (two) times daily.    . Coenzyme Q10 (COQ10) 100 MG CAPS Take 1 or 2 times daily as needed    . Melatonin 3 MG TABS Take 3 mg by mouth at bedtime as needed.    . Multiple Vitamin (MULTIVITAMIN) tablet Take 1 tablet by mouth daily.    . Naproxen Sodium (ALEVE) 220 MG CAPS Take by mouth as needed. Take two by mouth once a day    . omeprazole (PRILOSEC) 20 MG capsule Take 1 capsule (20 mg total) by mouth daily. 90 capsule 1  . Potassium Gluconate 595 MG CAPS Take by mouth daily.    . pravastatin (PRAVACHOL) 20 MG tablet Take 1 tablet (20 mg total) by mouth daily. 90 tablet 1  . tolterodine (DETROL LA) 4 MG 24 hr capsule Take 4 mg by mouth daily.    . fluticasone (FLONASE) 50 MCG/ACT nasal spray Place 2 sprays into both nostrils daily. (Patient not taking: Reported on 05/15/2014) 48 g 3   No current facility-administered medications on file prior to visit.   Allergies  Allergen Reactions  . Thimerosal     Review of Systems: [x]  complains of  [  ] denies General:   [  ] Recent weight change [ x ] Fatigue  [  ] Loss of appetite Eyes: [  ]  Vision Difficulty [  ]  Eye pain ENT: [  ]  Hearing difficulty [  ]  Difficulty Swallowing CVS: [x  ] Chest pain-on and off- not current [  ]  Palpitations/Irregular Heart beat [  ]  Shortness of breath lying flat [  ] Swelling of legs Resp: [  ] Frequent Cough [ x ] Shortness of Breath  [  ]  Wheezing GI: [  ] Heartburn  [  ] Nausea or Vomiting  [  ] Diarrhea [  ] Constipation  [  ] Abdominal Pain GU: [  ]  Polyuria  [ x ]   nocturia Bones/joints:  [ x ]  Muscle aches  [ x ] Joint Pain  [  ]  Bone pain Skin/Hair/Nails: [  ]  Rash  [  ] New stretch marks [  ]  Itching [  x] Hair loss [  ]  Excessive hair growth Reproduction: [  ] Low sexual desire , [  ]  Women: Menstrual cycle problems [  ]  Women: Breast Discharge [  ] Men: Difficulty with erections [  ]  Men: Enlarged Breasts CNS: [  ] Frequent Headaches [  ] Blurry vision [  ] Tremors [  ] Seizures [  ] Loss of consciousness [  ] Localized weakness Endocrine: [  ]  Excess thirst [  ]  Feeling excessively hot [  ]  Feeling excessively cold Heme: [  ]  Easy bruising [  ]  Enlarged glands or lumps in neck Allergy: [  ]  Food allergies [  ] Environmental allergies  PE: BP 120/68 mmHg  Pulse 86  Resp 14  Ht 5' (1.524 m)  Wt 140 lb 8 oz (63.73 kg)  BMI 27.44 kg/m2  SpO2 94% Wt Readings from Last 3 Encounters:  05/15/14 140 lb 8 oz (63.73 kg)  04/29/14 139 lb 12.8 oz (63.413 kg)  04/11/14 141 lb (63.957 kg)    HEENT: [place]/AT, EOMI, no icterus, no proptosis, no chemosis, no mild lid lag, no retraction, eyes close completely Neck: thyroid gland - smooth, non-tender, no erythema, negative Pemberton's sign; no lymphadenopathy; no bruits Lungs: good air entry, clear bilaterally Heart: S1&S2 normal, regular rate & rhythm; faint systolic murmurs, no rubs or gallops Abd: soft, NT, ND, no HSM, +BS Ext: mild tremor in hands bilaterally, no edema, 2+ DP/PT pulses, good muscle mass Neuro: normal gait, 2+ reflexes bilaterally, normal 5/5 strength, no proximal myopathy  Derm: no pretibial myxoedema/skin dryness   ASSESSMENT: 1. Hypothyroidism  Problem List Items Addressed This Visit      Endocrine   Hypothyroidism - Primary    Patient with long-standing hypothyroidism, on levothyroxine therapy. she appears clinically euthyroid today. - she does not appear to have a goiter, thyroid nodules, or neck compression symptoms -Discussed with the patient regarding thyroid  hormone physiology, symptoms and signs of hypothyroidism.  -Discussed that goal TSH for her would be in the mid-higher end normal range given her age.  - To promote better absorption of the levothyroxine, I have asked her to move her calcium/Omez to lunch time.  - She can continue on current dose of levothyroxine for now.  -Will recheck her thyroid levels in 2 months from now.   -Recent Vitamin D levels are also at goal on current supplementation and don't explain her fatigue/muscle weakness.   -Breathing and SOB are slightly better off the Flonase. Continue to follow back with pulmonary and PCP for further investigation.           -RTC in 2 months  Julanne Schlueter [facility]  05/15/2014   1:34 PM

## 2014-05-16 ENCOUNTER — Other Ambulatory Visit: Payer: Self-pay

## 2014-05-16 MED ORDER — OMEPRAZOLE 20 MG PO CPDR: 20.0000 mg | DELAYED_RELEASE_CAPSULE | Freq: Every day | ORAL | Status: DC

## 2014-05-16 NOTE — Telephone Encounter (Signed)
Rx sent to pharmacy by escript  

## 2014-05-16 NOTE — Telephone Encounter (Signed)
The pt is hoping to get a refill of her Prilosec faxed to [facility] [phone]  Pt's member number - [phone]

## 2014-05-21 ENCOUNTER — Encounter: Payer: Self-pay | Admitting: Internal Medicine

## 2014-05-21 ENCOUNTER — Telehealth: Payer: Self-pay | Admitting: *Deleted

## 2014-05-21 ENCOUNTER — Ambulatory Visit: Payer: PRIVATE HEALTH INSURANCE

## 2014-05-21 ENCOUNTER — Ambulatory Visit (INDEPENDENT_AMBULATORY_CARE_PROVIDER_SITE_OTHER): Payer: Medicare Other | Admitting: Internal Medicine

## 2014-05-21 ENCOUNTER — Other Ambulatory Visit: Payer: Self-pay | Admitting: *Deleted

## 2014-05-21 VITALS — BP 122/86 | HR 87 | Ht 60.0 in | Wt 141.0 lb

## 2014-05-21 DIAGNOSIS — R0600 Dyspnea, unspecified: Secondary | ICD-10-CM

## 2014-05-21 DIAGNOSIS — Z01812 Encounter for preprocedural laboratory examination: Secondary | ICD-10-CM | POA: Diagnosis not present

## 2014-05-21 DIAGNOSIS — R0602 Shortness of breath: Secondary | ICD-10-CM

## 2014-05-21 DIAGNOSIS — I701 Atherosclerosis of renal artery: Secondary | ICD-10-CM | POA: Diagnosis not present

## 2014-05-21 LAB — PULMONARY FUNCTION TEST
DL/VA % pred: 76 %
DL/VA: 3.23 ml/min/mmHg/L
DLCO UNC % PRED: 39 %
DLCO unc: 7.42 ml/min/mmHg
FEF 25-75 POST: 2.05 L/s
FEF 25-75 PRE: 1.98 L/s
FEF2575-%Change-Post: 3 %
FEF2575-%PRED-POST: 193 %
FEF2575-%Pred-Pre: 186 %
FEV1-%CHANGE-POST: 0 %
FEV1-%PRED-PRE: 100 %
FEV1-%Pred-Post: 100 %
FEV1-PRE: 1.5 L
FEV1-Post: 1.5 L
FEV1FVC-%Change-Post: 3 %
FEV1FVC-%Pred-Pre: 119 %
FEV6-%CHANGE-POST: -3 %
FEV6-%PRED-POST: 87 %
FEV6-%Pred-Pre: 89 %
FEV6-POST: 1.66 L
FEV6-Pre: 1.71 L
FEV6FVC-%Change-Post: 0 %
FEV6FVC-%PRED-POST: 105 %
FEV6FVC-%PRED-PRE: 106 %
FVC-%Change-Post: -2 %
FVC-%PRED-PRE: 84 %
FVC-%Pred-Post: 81 %
FVC-PRE: 1.71 L
FVC-Post: 1.66 L
POST FEV1/FVC RATIO: 90 %
Post FEV6/FVC ratio: 100 %
Pre FEV1/FVC ratio: 88 %
Pre FEV6/FVC Ratio: 100 %
RV % PRED: 84 %
RV: 1.89 L
TLC % pred: 79 %
TLC: 3.53 L

## 2014-05-21 NOTE — Progress Notes (Signed)
PFT performed.

## 2014-05-21 NOTE — Assessment & Plan Note (Signed)
Shortness of breath multifactorial: Deconditioning, possible restrictive disease, possible ILD  Pulmonary function testing done today showed a mild restriction, RV noted to be at 74, TLC 70, DLCO severely decreased at 39%, 6 minute walk test showed 144 m/172 feet given the findings of her severely decreased DLCO and her 6 minute walk test there is a concern for underlying ILD. I have reviewed her CT of abdomen and pelvis, the lung windows for the past 3 years and noted that there is some parenchymal scarring and questionable findings of ILD by my read. Patient will require further evaluation with high-resolution CT scan of chest without contrast  Plan: -Start regimen of Flonase every other day -There may be a component of deconditioning associated with shortness of breath, currently able to climb 15 stairs which was unable to do 6 weeks ago. Continue with exercises tolerate -High-resolution CAT scan of chest without contrast for chronic shortness of breath

## 2014-05-21 NOTE — Telephone Encounter (Signed)
PFT order placed

## 2014-05-21 NOTE — Progress Notes (Signed)
MRN# [id number] Julia Patterson 11-23-1929   CC: Chief Complaint  Patient presents with  . Follow-up    cough; SOB w/activity; feels the same since last visit      Brief History: 04/29/2014 HPI Patient is a pleasant 81 year old female presents today for complaint of dyspnea 1 month. History is as follows, she cannot state any inciting factors that occurred a month ago, she has no pets, no history of smoking, no sick contacts, no travel. she states that dyspnea was gradual in onset, accompanied with some mild leg swelling, she was placed on Flonase, she also endorses increased fatigue. Patient states that one month ago she didn't climb about 15 steps, his exercise, not is very difficult with moderate to severe dyspnea on exertion, also accompanied with increased weakness. Although she does not have any first-degree smoking history, she does have significant second hand smoke exposure from birth about 81 years old from appearance, and then throughout most of her marriage secondhand smoke exposure from her husband. Patient has had a cardiac workup with nuclear stress test, echo, chest x-ray. Recent chest x-ray showed large hiatal hernia. Patient states that she has a history of hypothyroidism, although she does have increased weakness, increased fatigue, she is taking her prescribed medication on a regular basis. However, she cannot recall last time she had thyroid function studies performed. Plan - follow up with Endo for symptoms of hypothyroidism, pfts, 25mwt, stop flonase (for 1 week)   Events since last clinic visit: Patient presents today for follow-up visit of her dyspnea on exertion.  Visit she was noted to have symptoms of hypothyroidism which included shortness of breath, dyspnea on exertion, fatigue, she follow-up with endocrinology who noted that she is clinically euthyroid and will continue her current dose of Synthroid and some of her vitamin supplementation was adjusted. She  also had a pulmonary function test done today and a 6 minute walk test done. Patient states that since her last visit she did stop her Flonase for 1 week, did not notice any change in her dyspnea on exertion, and has restarted today with a regimen of going every other day. Patient states overall dyspnea on exertion has slowly improved, in the past 2 months ago she was unable to climb 15 stairs, today she states that she is able to climb 15 stairs and walk about 140 feet.      PMHX:   Past Medical History  Diagnosis Date  . GERD (gastroesophageal reflux disease)   . Hypothyroidism   . Hypercholesterolemia   . Urinary incontinence   . Osteoporosis     s/p fosamax, boniva, reclast, forteo.  bilateral spontaneous femur fractures  . Recurrent cystitis   . Varicose veins     chronic venous insufficiency  . Degenerative arthritis     lumbar spine.  spondylolisthesis, hip and leg pain, scoliosis  . Carotid artery occlusion    Surgical Hx:  Past Surgical History  Procedure Laterality Date  . Tonsillectomy and adenoidectomy  1950  . Varicose vein ligation  1972  . Uterus suspension and sterilization  1963  . Breast reduction surgery  1987  . Hysterectomy and bladder repair  1998  . Varicose vein sclerotherapy      Dr Hulda Humphrey  . L5-l6 fusion  2/07  . Trigger finger repair      right fourth   Family Hx:  Family History  Problem Relation Age of Onset  . Breast cancer Daughter   . Rheumatic fever Mother  died age 4  . Heart disease      father's family  . Diabetes      father's family  . Heart attack Father    Social Hx:   History  Substance Use Topics  . Smoking status: Never Smoker   . Smokeless tobacco: Never Used  . Alcohol Use: No   Medication:   Current Outpatient Rx  Name  Route  Sig  Dispense  Refill  . acetaminophen (TYLENOL 8 HOUR) 650 MG CR tablet   Oral   Take 650 mg by mouth 2 (two) times daily.         Marland Kitchen aspirin 81 MG tablet   Oral   Take 81 mg by  mouth daily.         . Calcium Citrate-Vitamin D (CITRACAL + D PO)   Oral   Take by mouth 3 (three) times daily.         . cholecalciferol (VITAMIN D) 1000 UNITS tablet   Oral   Take 1,000 Units by mouth 2 (two) times daily.         . Coenzyme Q10 (COQ10) 100 MG CAPS      Take 1 or 2 times daily as needed         . fluticasone (FLONASE) 50 MCG/ACT nasal spray   Each Nare   Place 2 sprays into both nostrils daily.   48 g   3   . levothyroxine (SYNTHROID, LEVOTHROID) 75 MCG tablet   Oral   Take 1 tablet (75 mcg total) by mouth daily.   90 tablet   3   . Melatonin 3 MG TABS   Oral   Take 3 mg by mouth at bedtime as needed.         . Multiple Vitamin (MULTIVITAMIN) tablet   Oral   Take 1 tablet by mouth daily.         . Naproxen Sodium (ALEVE) 220 MG CAPS   Oral   Take by mouth as needed. Take two by mouth once a day         . omeprazole (PRILOSEC) 20 MG capsule   Oral   Take 1 capsule (20 mg total) by mouth daily.   90 capsule   1   . Potassium Gluconate 595 MG CAPS   Oral   Take by mouth daily.         . pravastatin (PRAVACHOL) 20 MG tablet   Oral   Take 1 tablet (20 mg total) by mouth daily.   90 tablet   1   . tolterodine (DETROL LA) 4 MG 24 hr capsule   Oral   Take 4 mg by mouth daily.            Review of Systems: Gen:  Denies  fever, sweats, chills HEENT: Denies blurred vision, double vision, ear pain, eye pain, hearing loss, nose bleeds, sore throat Cvc:  No dizziness, chest pain or heaviness Resp:   Denies cough or sputum porduction, shortness of breath Gi: Denies swallowing difficulty, stomach pain, nausea or vomiting, diarrhea, constipation, bowel incontinence Gu:  Denies bladder incontinence, burning urine Ext:   No Joint pain, stiffness or swelling Skin: No skin rash, easy bruising or bleeding or hives Endoc:  No polyuria, polydipsia , polyphagia or weight change Psych: No depression, insomnia or hallucinations  Other:   All other systems negative  Allergies:  Thimerosal  Physical Examination:  VS: BP 122/86 mmHg  Pulse 87  Ht 5' (1.524  m)  Wt 141 lb (63.957 kg)  BMI 27.54 kg/m2  SpO2 94%  General Appearance: No distress  Neuro: EXAM: without focal findings, mental status, speech normal, alert and oriented, cranial nerves 2-12 grossly normal  HEENT: PERRLA, EOM intact, no ptosis, no other lesions noticed Pulmonary:Exam: Fine shallow breath sounds, good respiratory effort, very mild fine basilar crackles Cardiovascular:@ Exam:  Normal S1,S2.  No m/r/g.     Abdomen:Exam: Benign, Soft, non-tender, No masses  Skin:   warm, no rashes, no ecchymosis  Extremities: normal, no cyanosis, clubbing, no edema, warm with normal capillary refill.   Labs results:  BMP Lab Results  Component Value Date   NA 141 03/27/2014   K 4.2 03/27/2014   CL 107 03/27/2014   CO2 29 03/27/2014   GLUCOSE 82 03/27/2014   BUN 23 03/27/2014   CREATININE 1.04 03/27/2014     CBC CBC Latest Ref Rng 06/19/2013 02/28/2012  WBC 4.0 - 10.5 K/uL 6.4 6.0  Hemoglobin 12.0 - 15.0 g/dL 15.0 14.5  Hematocrit 36.0 - 46.0 % 44.3 43.5  Platelets 150.0 - 400.0 K/uL 254.0 204.0     Rad results: None   Pulmonary function testing 05/21/2014: FEV1 1.5, 100 FEV1/FVC 88% actual SVC 80 RV 74 TLC 70 RV/TLC 108 DLCO 39% Impression: no significant obstruction mild restriction noted. Severely decreased DLCO  6 minute walk test: 144 m/172 feet, no desaturations, note: Walked at a very slow pace  Assessment and Plan: 81 year old female with past medical history of obesity, hyperlipidemia, hypothyroidism, large hiatal hernia seen in consultation for follow-up of dyspnea on exertion. SOB (shortness of breath) Shortness of breath multifactorial: Deconditioning, possible restrictive disease, possible ILD  Pulmonary function testing done today showed a mild restriction, RV noted to be at 74, TLC 70, DLCO severely decreased at 39%, 6 minute walk  test showed 144 m/172 feet given the findings of her severely decreased DLCO and her 6 minute walk test there is a concern for underlying ILD. I have reviewed her CT of abdomen and pelvis, the lung windows for the past 3 years and noted that there is some parenchymal scarring and questionable findings of ILD by my read. Patient will require further evaluation with high-resolution CT scan of chest without contrast  Plan: -Start regimen of Flonase every other day -There may be a component of deconditioning associated with shortness of breath, currently able to climb 15 stairs which was unable to do 6 weeks ago. Continue with exercises tolerate -High-resolution CAT scan of chest without contrast for chronic shortness of breath      Updated Medication List Outpatient Encounter Prescriptions as of 05/21/2014  Medication Sig  . acetaminophen (TYLENOL 8 HOUR) 650 MG CR tablet Take 650 mg by mouth 2 (two) times daily.  Marland Kitchen aspirin 81 MG tablet Take 81 mg by mouth daily.  . Calcium Citrate-Vitamin D (CITRACAL + D PO) Take by mouth 3 (three) times daily.  . cholecalciferol (VITAMIN D) 1000 UNITS tablet Take 1,000 Units by mouth 2 (two) times daily.  . Coenzyme Q10 (COQ10) 100 MG CAPS Take 1 or 2 times daily as needed  . fluticasone (FLONASE) 50 MCG/ACT nasal spray Place 2 sprays into both nostrils daily.  Marland Kitchen levothyroxine (SYNTHROID, LEVOTHROID) 75 MCG tablet Take 1 tablet (75 mcg total) by mouth daily.  . Melatonin 3 MG TABS Take 3 mg by mouth at bedtime as needed.  . Multiple Vitamin (MULTIVITAMIN) tablet Take 1 tablet by mouth daily.  . Naproxen Sodium (ALEVE) 220 MG  CAPS Take by mouth as needed. Take two by mouth once a day  . omeprazole (PRILOSEC) 20 MG capsule Take 1 capsule (20 mg total) by mouth daily.  . Potassium Gluconate 595 MG CAPS Take by mouth daily.  . pravastatin (PRAVACHOL) 20 MG tablet Take 1 tablet (20 mg total) by mouth daily.  Marland Kitchen tolterodine (DETROL LA) 4 MG 24 hr capsule Take 4 mg  by mouth daily.    Orders for this visit: No orders of the defined types were placed in this encounter.    Thank  you for the visitation and for allowing  [facility] to assist in the care of your patient. Our recommendations are noted above.  Please contact us if we can be of further service.  Vilinda Boehringer, MD [facility] Office Number: [phone]

## 2014-05-21 NOTE — Progress Notes (Signed)
SMW performed today. 

## 2014-05-21 NOTE — Patient Instructions (Signed)
Follow up with Dr. Stevenson Clinch in 6 weeks - continue with flonase every other day - we will order a high resolution CT chest without contrast prior to your next visit.

## 2014-05-23 ENCOUNTER — Other Ambulatory Visit: Payer: Self-pay | Admitting: *Deleted

## 2014-05-23 DIAGNOSIS — R0602 Shortness of breath: Secondary | ICD-10-CM

## 2014-05-23 DIAGNOSIS — Z01812 Encounter for preprocedural laboratory examination: Secondary | ICD-10-CM

## 2014-05-23 NOTE — Addendum Note (Signed)
Addended by: Devona Konig on: 05/23/2014 08:30 AM   Modules accepted: Orders

## 2014-06-09 DIAGNOSIS — M65342 Trigger finger, left ring finger: Secondary | ICD-10-CM | POA: Diagnosis not present

## 2014-06-17 DIAGNOSIS — E669 Obesity, unspecified: Secondary | ICD-10-CM | POA: Diagnosis not present

## 2014-06-17 DIAGNOSIS — I6529 Occlusion and stenosis of unspecified carotid artery: Secondary | ICD-10-CM | POA: Diagnosis not present

## 2014-06-17 DIAGNOSIS — I701 Atherosclerosis of renal artery: Secondary | ICD-10-CM | POA: Diagnosis not present

## 2014-06-17 DIAGNOSIS — E785 Hyperlipidemia, unspecified: Secondary | ICD-10-CM | POA: Diagnosis not present

## 2014-06-17 DIAGNOSIS — I10 Essential (primary) hypertension: Secondary | ICD-10-CM | POA: Diagnosis not present

## 2014-06-18 ENCOUNTER — Ambulatory Visit (INDEPENDENT_AMBULATORY_CARE_PROVIDER_SITE_OTHER): Payer: Medicare Other | Admitting: Internal Medicine

## 2014-06-18 ENCOUNTER — Encounter: Payer: Self-pay | Admitting: Internal Medicine

## 2014-06-18 VITALS — BP 110/68 | HR 75 | Temp 97.8°F | Ht 60.0 in | Wt 140.4 lb

## 2014-06-18 DIAGNOSIS — E039 Hypothyroidism, unspecified: Secondary | ICD-10-CM | POA: Diagnosis not present

## 2014-06-18 DIAGNOSIS — I10 Essential (primary) hypertension: Secondary | ICD-10-CM

## 2014-06-18 DIAGNOSIS — I739 Peripheral vascular disease, unspecified: Secondary | ICD-10-CM

## 2014-06-18 DIAGNOSIS — I779 Disorder of arteries and arterioles, unspecified: Secondary | ICD-10-CM | POA: Diagnosis not present

## 2014-06-18 DIAGNOSIS — E7800 Pure hypercholesterolemia, unspecified: Secondary | ICD-10-CM

## 2014-06-18 DIAGNOSIS — R0602 Shortness of breath: Secondary | ICD-10-CM

## 2014-06-18 DIAGNOSIS — K219 Gastro-esophageal reflux disease without esophagitis: Secondary | ICD-10-CM | POA: Diagnosis not present

## 2014-06-18 DIAGNOSIS — I701 Atherosclerosis of renal artery: Secondary | ICD-10-CM | POA: Diagnosis not present

## 2014-06-18 LAB — TSH: TSH: 2.05 u[IU]/mL (ref 0.35–4.50)

## 2014-06-18 NOTE — Progress Notes (Signed)
Patient ID: Julia Patterson, female   DOB: July 20, 1929, 81 y.o.   MRN: [id number]   Subjective:    Patient ID: Julia Patterson, female    DOB: 08/13/1929, 81 y.o.   MRN: [id number]  HPI  Patient here for a scheduled follow up.  She is s/p injection in her left fourth finger.  Trigger finger.  Is better.  Just evaluated by [facility].  Kidney stable.  Breathing is better.  Using flonase.   Seeing pulmonary.  Planning for CT scan next week.  Eating and drinking well.  Bowels stable.     Past Medical History  Diagnosis Date  . GERD (gastroesophageal reflux disease)   . Hypothyroidism   . Hypercholesterolemia   . Urinary incontinence   . Osteoporosis     s/p fosamax, boniva, reclast, forteo.  bilateral spontaneous femur fractures  . Recurrent cystitis   . Varicose veins     chronic venous insufficiency  . Degenerative arthritis     lumbar spine.  spondylolisthesis, hip and leg pain, scoliosis  . Carotid artery occlusion     Current Outpatient Prescriptions on File Prior to Visit  Medication Sig Dispense Refill  . acetaminophen (TYLENOL 8 HOUR) 650 MG CR tablet Take 650 mg by mouth 2 (two) times daily.    Marland Kitchen aspirin 81 MG tablet Take 81 mg by mouth daily.    . Calcium Citrate-Vitamin D (CITRACAL + D PO) Take by mouth 3 (three) times daily.    . cholecalciferol (VITAMIN D) 1000 UNITS tablet Take 1,000 Units by mouth 2 (two) times daily.    . Coenzyme Q10 (COQ10) 100 MG CAPS Take 1 or 2 times daily as needed    . fluticasone (FLONASE) 50 MCG/ACT nasal spray Place 2 sprays into both nostrils daily. (Patient taking differently: Place 2 sprays into both nostrils daily as needed. ) 48 g 3  . levothyroxine (SYNTHROID, LEVOTHROID) 75 MCG tablet Take 1 tablet (75 mcg total) by mouth daily. 90 tablet 3  . Melatonin 3 MG TABS Take 3 mg by mouth at bedtime as needed.    . Multiple Vitamin (MULTIVITAMIN) tablet Take 1 tablet by mouth daily.    . Naproxen Sodium (ALEVE) 220 MG CAPS Take by mouth  as needed. Take two by mouth once a day    . omeprazole (PRILOSEC) 20 MG capsule Take 1 capsule (20 mg total) by mouth daily. 90 capsule 1  . Potassium Gluconate 595 MG CAPS Take by mouth daily.    . pravastatin (PRAVACHOL) 20 MG tablet Take 1 tablet (20 mg total) by mouth daily. 90 tablet 1  . tolterodine (DETROL LA) 4 MG 24 hr capsule Take 4 mg by mouth daily.     No current facility-administered medications on file prior to visit.    Review of Systems  Constitutional: Negative for appetite change and unexpected weight change.  HENT: Negative for congestion and sinus pressure.   Respiratory: Negative for cough, chest tightness and shortness of breath.   Cardiovascular: Negative for chest pain, palpitations and leg swelling.  Gastrointestinal: Negative for nausea, vomiting and diarrhea.  Genitourinary: Negative for dysuria and difficulty urinating.  Skin: Negative for color change and rash.  Neurological: Negative for dizziness, light-headedness and headaches.  Psychiatric/Behavioral: Negative for dysphoric mood and agitation.       Objective:     Blood pressure recheck:  128/84  Physical Exam  Constitutional: She appears well-developed and well-nourished. No distress.  HENT:  Nose: Nose normal.  Mouth/Throat: Oropharynx  is clear and moist.  Neck: Neck supple. No thyromegaly present.  Cardiovascular: Normal rate and regular rhythm.   Pulmonary/Chest: Breath sounds normal. No respiratory distress. She has no wheezes.  Abdominal: Soft. Bowel sounds are normal. There is no tenderness.  Musculoskeletal: She exhibits no edema or tenderness.  Lymphadenopathy:    She has no cervical adenopathy.    BP 110/68 mmHg  Pulse 75  Temp(Src) 97.8 F (36.6 C) (Oral)  Ht 5' (1.524 m)  Wt 140 lb 6 oz (63.674 kg)  BMI 27.42 kg/m2  SpO2 97% Wt Readings from Last 3 Encounters:  06/18/14 140 lb 6 oz (63.674 kg)  05/21/14 141 lb (63.957 kg)  05/15/14 140 lb 8 oz (63.73 kg)     Lab  Results  Component Value Date   WBC 6.4 06/19/2013   HGB 15.0 06/19/2013   HCT 44.3 06/19/2013   PLT 254.0 06/19/2013   GLUCOSE 82 03/27/2014   CHOL 173 03/27/2014   TRIG 127.0 03/27/2014   HDL 44.60 03/27/2014   LDLCALC 103* 03/27/2014   ALT 16 03/27/2014   AST 19 03/27/2014   NA 141 03/27/2014   K 4.2 03/27/2014   CL 107 03/27/2014   CREATININE 1.04 03/27/2014   BUN 23 03/27/2014   CO2 29 03/27/2014   TSH 2.05 06/18/2014       Assessment & Plan:   Problem List Items Addressed This Visit    Carotid artery disease    Followed by Dr Lucky Cowboy.  See Overview.  Continue risk factor modification.        Essential hypertension, benign    Blood pressure doing well.  Same medication regimen.  Follow pressures.  Follow metabolic panel.        GERD (gastroesophageal reflux disease)    On omeprazole.  Controlled.        Hypercholesterolemia    On pravastatin.  Low cholesterol diet and exercise.  Follow lipid panel and liver function tests.        Hypothyroidism - Primary    On thyroid replacement.  Follow tsh.        Relevant Orders   TSH (Completed)   Renal artery stenosis    Just evaluated at [facility].  States stable.  Follow.        SOB (shortness of breath)    Breathing is some better.  Planning for CT chest next week.  Seeing pulmonary.  W/up in progress.          I spent 25 minutes with the patient and more than 50% of the time was spent in consultation regarding the above.     Einar Pheasant, MD

## 2014-06-18 NOTE — Progress Notes (Signed)
Pre visit review using our clinic review tool, if applicable. No additional management support is needed unless otherwise documented below in the visit note. 

## 2014-06-19 ENCOUNTER — Encounter: Payer: Self-pay | Admitting: *Deleted

## 2014-06-22 ENCOUNTER — Encounter: Payer: Self-pay | Admitting: Internal Medicine

## 2014-06-22 NOTE — Assessment & Plan Note (Signed)
On pravastatin.  Low cholesterol diet and exercise.  Follow lipid panel and liver function tests.   

## 2014-06-22 NOTE — Assessment & Plan Note (Signed)
On thyroid replacement.  Follow tsh.  

## 2014-06-22 NOTE — Assessment & Plan Note (Signed)
Blood pressure doing well.  Same medication regimen.  Follow pressures.  Follow metabolic panel.   

## 2014-06-22 NOTE — Assessment & Plan Note (Signed)
Breathing is some better.  Planning for CT chest next week.  Seeing pulmonary.  W/up in progress.

## 2014-06-22 NOTE — Assessment & Plan Note (Signed)
On omeprazole.  Controlled.   

## 2014-06-22 NOTE — Assessment & Plan Note (Signed)
Followed by Dr Lucky Cowboy.  See Overview.  Continue risk factor modification.

## 2014-06-22 NOTE — Assessment & Plan Note (Signed)
Just evaluated at [facility].  States stable.  Follow.

## 2014-06-24 ENCOUNTER — Ambulatory Visit
Admission: RE | Admit: 2014-06-24 | Discharge: 2014-06-24 | Disposition: A | Discharge status: Home / Self Care | Payer: Medicare Other | Source: Ambulatory Visit | Attending: Internal Medicine | Admitting: Internal Medicine

## 2014-06-24 DIAGNOSIS — I2510 Atherosclerotic heart disease of native coronary artery without angina pectoris: Secondary | ICD-10-CM | POA: Insufficient documentation

## 2014-06-24 DIAGNOSIS — N200 Calculus of kidney: Secondary | ICD-10-CM | POA: Insufficient documentation

## 2014-06-24 DIAGNOSIS — K449 Diaphragmatic hernia without obstruction or gangrene: Secondary | ICD-10-CM | POA: Diagnosis not present

## 2014-06-24 DIAGNOSIS — R911 Solitary pulmonary nodule: Secondary | ICD-10-CM | POA: Diagnosis not present

## 2014-06-24 DIAGNOSIS — R0602 Shortness of breath: Secondary | ICD-10-CM | POA: Diagnosis present

## 2014-07-01 ENCOUNTER — Encounter: Payer: Self-pay | Admitting: Internal Medicine

## 2014-07-01 ENCOUNTER — Ambulatory Visit (INDEPENDENT_AMBULATORY_CARE_PROVIDER_SITE_OTHER): Payer: Medicare Other | Admitting: Internal Medicine

## 2014-07-01 VITALS — BP 110/80 | HR 83 | Temp 97.7°F | Ht 60.0 in | Wt 140.0 lb

## 2014-07-01 DIAGNOSIS — K449 Diaphragmatic hernia without obstruction or gangrene: Secondary | ICD-10-CM

## 2014-07-01 DIAGNOSIS — I701 Atherosclerosis of renal artery: Secondary | ICD-10-CM | POA: Diagnosis not present

## 2014-07-01 DIAGNOSIS — R0602 Shortness of breath: Secondary | ICD-10-CM | POA: Diagnosis not present

## 2014-07-01 NOTE — Progress Notes (Signed)
MRN# [id number] Julia Patterson May 26, 1929   CC: Chief Complaint  Patient presents with  . Follow-up    Pt states sob with exertion the same as before; she had Ct scan and is here for results. Pt has dry cough, with no wheezing. she denies chest tightness      Brief History: 04/29/2014 HPI Patient is a pleasant 81 year old female presents today for complaint of dyspnea 1 month. History is as follows, she cannot state any inciting factors that occurred a month ago, she has no pets, no history of smoking, no sick contacts, no travel. she states that dyspnea was gradual in onset, accompanied with some mild leg swelling, she was placed on Flonase, she also endorses increased fatigue. Patient states that one month ago she didn't climb about 15 steps, his exercise, not is very difficult with moderate to severe dyspnea on exertion, also accompanied with increased weakness. Although she does not have any first-degree smoking history, she does have significant second hand smoke exposure from birth about 81 years old from appearance, and then throughout most of her marriage secondhand smoke exposure from her husband. Patient has had a cardiac workup with nuclear stress test, echo, chest x-ray. Recent chest x-ray showed large hiatal hernia. Patient states that she has a history of hypothyroidism, although she does have increased weakness, increased fatigue, she is taking her prescribed medication on a regular basis. However, she cannot recall last time she had thyroid function studies performed. Plan - follow up with Endo for symptoms of hypothyroidism, pfts, 24mwt, stop flonase (for 1 week)   ROV 05/21/14 Patient presents today for follow-up visit of her dyspnea on exertion.  Visit she was noted to have symptoms of hypothyroidism which included shortness of breath, dyspnea on exertion, fatigue, she follow-up with endocrinology who noted that she is clinically euthyroid and will continue her current  dose of Synthroid and some of her vitamin supplementation was adjusted. She also had a pulmonary function test done today and a 6 minute walk test done. Patient states that since her last visit she did stop her Flonase for 1 week, did not notice any change in her dyspnea on exertion, and has restarted today with a regimen of going every other day. Patient states overall dyspnea on exertion has slowly improved, in the past 2 months ago she was unable to climb 15 stairs, today she states that she is able to climb 15 stairs and walk about 140 feet.   Plan:flonase, exercise, HRCT   Events since last clinic visit: She presents today for a followup visit. Since her last visit she stopped using Flonase since her breathing is improved. Today patient states that she can climb about 15 stairs, however with some dyspnea on exertion, but it is much improved. She does endorse a mild dry cough, again this is also improving. Since her last visit she's also had a high-resolution CT scan of the chest, is also for which she would like to discuss today.     Medication:   Current Outpatient Rx  Name  Route  Sig  Dispense  Refill  . acetaminophen (TYLENOL 8 HOUR) 650 MG CR tablet   Oral   Take 650 mg by mouth 2 (two) times daily.         Marland Kitchen aspirin 81 MG tablet   Oral   Take 81 mg by mouth daily.         . Calcium Citrate-Vitamin D (CITRACAL + D PO)   Oral  Take by mouth 3 (three) times daily.         . cholecalciferol (VITAMIN D) 1000 UNITS tablet   Oral   Take 1,000 Units by mouth 2 (two) times daily.         . Coenzyme Q10 (COQ10) 100 MG CAPS      Take 1 or 2 times daily as needed         . levothyroxine (SYNTHROID, LEVOTHROID) 75 MCG tablet   Oral   Take 1 tablet (75 mcg total) by mouth daily.   90 tablet   3   . Melatonin 3 MG TABS   Oral   Take 3 mg by mouth at bedtime as needed.         . Multiple Vitamin (MULTIVITAMIN) tablet   Oral   Take 1 tablet by mouth daily.          . Naproxen Sodium (ALEVE) 220 MG CAPS   Oral   Take by mouth as needed. Take two by mouth once a day         . omeprazole (PRILOSEC) 20 MG capsule   Oral   Take 1 capsule (20 mg total) by mouth daily.   90 capsule   1   . Potassium Gluconate 595 MG CAPS   Oral   Take by mouth daily.         . pravastatin (PRAVACHOL) 20 MG tablet   Oral   Take 1 tablet (20 mg total) by mouth daily.   90 tablet   1   . tolterodine (DETROL LA) 4 MG 24 hr capsule   Oral   Take 4 mg by mouth daily.            Review of Systems: Gen:  Denies  fever, sweats, chills HEENT: Denies blurred vision, double vision, ear pain, eye pain, hearing loss, nose bleeds, sore throat Cvc:  No dizziness, chest pain or heaviness Resp:   Admits ZH:YQMV cough, dyspnea on exertion. Gi: Denies swallowing difficulty, stomach pain, nausea or vomiting, diarrhea, constipation, bowel incontinence Gu:  Denies bladder incontinence, burning urine Ext:   No Joint pain, stiffness or swelling Skin: No skin rash, easy bruising or bleeding or hives Endoc:  No polyuria, polydipsia , polyphagia or weight change Other:  All other systems negative  Allergies:  Thimerosal  Physical Examination:  VS: BP 110/80 mmHg  Pulse 83  Temp(Src) 97.7 F (36.5 C) (Oral)  Ht 5' (1.524 m)  Wt 140 lb (63.504 kg)  BMI 27.34 kg/m2  SpO2 94%  General Appearance: No distress  HEENT: PERRLA, no ptosis, no other lesions noticed Pulmonary:normal breath sounds., diaphragmatic excursion normal.No wheezing, No rales   Cardiovascular:  Normal S1,S2.  No m/r/g.     Abdomen:Exam: Benign, Soft, non-tender, No masses  Skin:   warm, no rashes, no ecchymosis  Extremities: normal, no cyanosis, clubbing, warm with normal capillary refill.    (The following images and results were reviewed by Dr. Stevenson Clinch). High-resolution CT scan of the chest 06/24/2014 Impression: -Massive hiatal hernia with evidence of mild fibrosis in the lung bases  bilaterally, likely secondary to recurrent aspiration, -No other findings to suggest superimposed interstitial lung disease at this time -Atherosclerosis, including left main and three-vessel coronary disease -A 4 mm nonobstructive calculus in the upper pole collecting system of the left kidney, extensive scarring in the upper pole of the left kidney also noted.  Other Studies Pulmonary function testing 05/21/2014: FEV1 1.5, 100 FEV1/FVC 88% actual SVC 80  RV 74 TLC 70 RV/TLC 108 DLCO 39% Impression: no significant obstruction mild restriction noted. Severely decreased DLCO  05/21/14 6 minute walk test: 144 m/172 feet, no desaturations, note: Walked at a very slow pace     Assessment and Plan:81 year old female presenting today for followup visit of ongoing dyspnea on exertion, slowly improving. Hiatal hernia Massive hiatal hernia noted on HRCT. - this could also be adding to her current level of respiratory discomfort.  Plan - refer to Dr. Marta Lamas for any surgical recommendations of massive hiatal hernia    SOB (shortness of breath) Dyspnea on exertion: Multifactorial: Large hiatal hernia, age-related, deconditioning, cardiac disease (left main and three-vessel coronary artery disease), Possible aspiration  Pulmonary function testing done today showed a mild restriction, RV noted to be at 74, TLC 70, DLCO severely decreased at 39%, 6 minute walk test showed 144 m/172 feet given the findings of her severely decreased DLCO   High-resolution CT scan of the chest on 06/24/2014 showed the following:-Massive hiatal hernia with mild evidence of fibrosis in the bases likely secondary to recurrent aspiration; no other findings to suggest superimposed interstitial lung disease; left main and three-vessel coronary artery disease.  At this time I believe the majority of her discomfort is coming from his massive hiatal hernia combined with deconditioning and possible coronary artery disease,  I'm not sure what surgical options that will be offered for her given her advanced age. Will refer to Dr. Nestor Lewandowsky, for discussion in any surgical options for her hiatal hernia.  Plan: - continue with Flonase as needed for nasal congestion/discharge/sneezing. - Referral to Dr. Nestor Lewandowsky,  hiatal hernia evaluation - continue exercise as tolerated - Discuss possible cardiac intervention given left main and three-vessel coronary artery disease at next visit         Updated Medication List Outpatient Encounter Prescriptions as of 07/01/2014  Medication Sig  . acetaminophen (TYLENOL 8 HOUR) 650 MG CR tablet Take 650 mg by mouth 2 (two) times daily.  Marland Kitchen aspirin 81 MG tablet Take 81 mg by mouth daily.  . Calcium Citrate-Vitamin D (CITRACAL + D PO) Take by mouth 3 (three) times daily.  . cholecalciferol (VITAMIN D) 1000 UNITS tablet Take 1,000 Units by mouth 2 (two) times daily.  . Coenzyme Q10 (COQ10) 100 MG CAPS Take 1 or 2 times daily as needed  . levothyroxine (SYNTHROID, LEVOTHROID) 75 MCG tablet Take 1 tablet (75 mcg total) by mouth daily.  . Melatonin 3 MG TABS Take 3 mg by mouth at bedtime as needed.  . Multiple Vitamin (MULTIVITAMIN) tablet Take 1 tablet by mouth daily.  . Naproxen Sodium (ALEVE) 220 MG CAPS Take by mouth as needed. Take two by mouth once a day  . omeprazole (PRILOSEC) 20 MG capsule Take 1 capsule (20 mg total) by mouth daily.  . Potassium Gluconate 595 MG CAPS Take by mouth daily.  . pravastatin (PRAVACHOL) 20 MG tablet Take 1 tablet (20 mg total) by mouth daily.  Marland Kitchen tolterodine (DETROL LA) 4 MG 24 hr capsule Take 4 mg by mouth daily.  . [DISCONTINUED] fluticasone (FLONASE) 50 MCG/ACT nasal spray Place 2 sprays into both nostrils daily. (Patient not taking: Reported on 07/01/2014)   No facility-administered encounter medications on file as of 07/01/2014.    Orders for this visit: Orders Placed This Encounter  Procedures  . Ambulatory referral to General  Surgery    Referral Priority:  Routine    Referral Type:  Surgical    Referral Reason:  Specialty Services Required    Requested Specialty:  General Surgery    Number of Visits Requested:  1    Thank  you for the visitation and for allowing  [facility] to assist in the care of your patient. Our recommendations are noted above.  Please contact us if we can be of further service.  Vilinda Boehringer, MD [facility] Office Number: [phone]

## 2014-07-01 NOTE — Assessment & Plan Note (Signed)
Massive hiatal hernia noted on HRCT. - this could also be adding to her current level of respiratory discomfort.  Plan - refer to Dr. Marta Lamas for any surgical recommendations of massive hiatal hernia

## 2014-07-01 NOTE — Patient Instructions (Signed)
Follow up in 3 months - we will make a referral for you to see Dr. Marta Lamas (surgeon), for hiatal hernia evaluation.  - cont with flonase as needed for nasal discharge\sneezing etc.

## 2014-07-15 ENCOUNTER — Other Ambulatory Visit: Payer: Medicare Other

## 2014-07-16 ENCOUNTER — Other Ambulatory Visit: Payer: Self-pay | Admitting: *Deleted

## 2014-07-16 MED ORDER — PRAVASTATIN SODIUM 20 MG PO TABS: 20.0000 mg | ORAL_TABLET | Freq: Every day | ORAL | Status: DC

## 2014-07-21 NOTE — Assessment & Plan Note (Signed)
Dyspnea on exertion: Multifactorial: Large hiatal hernia, age-related, deconditioning, cardiac disease (left main and three-vessel coronary artery disease), Possible aspiration  Pulmonary function testing done today showed a mild restriction, RV noted to be at 74, TLC 70, DLCO severely decreased at 39%, 6 minute walk test showed 144 m/172 feet given the findings of her severely decreased DLCO   High-resolution CT scan of the chest on 06/24/2014 showed the following:-Massive hiatal hernia with mild evidence of fibrosis in the bases likely secondary to recurrent aspiration; no other findings to suggest superimposed interstitial lung disease; left main and three-vessel coronary artery disease.  At this time I believe the majority of her discomfort is coming from his massive hiatal hernia combined with deconditioning and possible coronary artery disease, I'm not sure what surgical options that will be offered for her given her advanced age. Will refer to Dr. Nestor Lewandowsky, for discussion in any surgical options for her hiatal hernia.  Plan: - continue with Flonase as needed for nasal congestion/discharge/sneezing. - Referral to Dr. Nestor Lewandowsky,  hiatal hernia evaluation - continue exercise as tolerated - Discuss possible cardiac intervention given left main and three-vessel coronary artery disease at next visit

## 2014-07-24 DIAGNOSIS — K449 Diaphragmatic hernia without obstruction or gangrene: Secondary | ICD-10-CM | POA: Diagnosis not present

## 2014-07-28 DIAGNOSIS — K449 Diaphragmatic hernia without obstruction or gangrene: Secondary | ICD-10-CM | POA: Diagnosis not present

## 2014-07-28 DIAGNOSIS — R0609 Other forms of dyspnea: Secondary | ICD-10-CM | POA: Diagnosis not present

## 2014-07-29 DIAGNOSIS — K449 Diaphragmatic hernia without obstruction or gangrene: Secondary | ICD-10-CM | POA: Insufficient documentation

## 2014-07-29 DIAGNOSIS — R0681 Apnea, not elsewhere classified: Secondary | ICD-10-CM | POA: Insufficient documentation

## 2014-08-14 ENCOUNTER — Inpatient Hospital Stay: Payer: Medicare Other | Attending: Cardiothoracic Surgery | Admitting: Cardiothoracic Surgery

## 2014-08-14 ENCOUNTER — Encounter: Payer: Self-pay | Admitting: Cardiothoracic Surgery

## 2014-08-14 VITALS — BP 152/81 | HR 80 | Temp 99.0°F | Resp 20 | Ht 59.0 in | Wt 140.5 lb

## 2014-08-14 DIAGNOSIS — K449 Diaphragmatic hernia without obstruction or gangrene: Secondary | ICD-10-CM

## 2014-08-14 NOTE — Progress Notes (Signed)
Patient ID: Julia Patterson, female   DOB: 1929/05/20, 81 y.o.   MRN: [id number]  Chief Complaint  Patient presents with  . Pre-op Exam    Referred By Dr. Royden Purl roll Reason for Referral hiatal hernia  HPI Location, Quality, Duration, Severity, Timing, Context, Modifying Factors, Associated Signs and Symptoms.  Julia Patterson is a 81 y.o. female.  This patient is an 81 year old woman with a large hiatal hernia. This has been present for quite some time radiologically. It has been at least 2 years since her prior CT scan showing a very large hiatal hernia. She presented with increasing shortness of breath and had a complete evaluation done including pulmonary function studies revealing normal spirometry but severely diminished DLCO. She had a CT scan done which shows a large hiatal hernia with stomach behind the heart and possible compression of the right lower lobe pulmonary vein. She was ultimately seen by Dr. Rochel Brome who discussed the pros and cons of surgery and at the present time she would like to discuss these further. She is able to walk up a flight of stairs although slowly and is able to walk on the level without chest pain. She does state that she feels somewhat short of breath with exertion. She's had both of her hips replaced in the last several years and this also leads to increased work of ambulation. She denied any recent weight loss nausea or vomiting. She states that her appetite is good.   Past Medical History  Diagnosis Date  . GERD (gastroesophageal reflux disease)   . Hypothyroidism   . Hypercholesterolemia   . Urinary incontinence   . Osteoporosis     s/p fosamax, boniva, reclast, forteo.  bilateral spontaneous femur fractures  . Recurrent cystitis   . Varicose veins     chronic venous insufficiency  . Degenerative arthritis     lumbar spine.  spondylolisthesis, hip and leg pain, scoliosis  . Carotid artery occlusion     Past Surgical History   Procedure Laterality Date  . Tonsillectomy and adenoidectomy  1950  . Varicose vein ligation  1972  . Uterus suspension and sterilization  1963  . Breast reduction surgery  1987  . Hysterectomy and bladder repair  1998  . Varicose vein sclerotherapy      Dr Hulda Humphrey  . L5-l6 fusion  2/07  . Trigger finger repair      right fourth    Family History  Problem Relation Age of Onset  . Breast cancer Daughter   . Rheumatic fever Mother     died age 89  . Heart disease      father's family  . Diabetes      father's family  . Heart attack Father     Social History History  Substance Use Topics  . Smoking status: Never Smoker   . Smokeless tobacco: Never Used  . Alcohol Use: No    Allergies  Allergen Reactions  . Thimerosal Rash    Current Outpatient Prescriptions  Medication Sig Dispense Refill  . acetaminophen (RA ACETAMINOPHEN) 650 MG CR tablet Take by mouth.    Marland Kitchen aspirin EC 81 MG tablet Take by mouth.    . Calcium Citrate-Vitamin D (CALCIUM CITRATE + D) 250-200 MG-UNIT TABS Take by mouth.    . Cholecalciferol (VITAMIN D-1000 MAX ST) 1000 UNITS tablet Take by mouth.    . Coenzyme Q10 100 MG capsule Take 1 or 2 times daily as needed    . fluticasone (FLONASE)  50 MCG/ACT nasal spray Place into the nose.    . levothyroxine (SYNTHROID, LEVOTHROID) 75 MCG tablet Take 1 tablet (75 mcg total) by mouth daily. 90 tablet 3  . levothyroxine (SYNTHROID, LEVOTHROID) 75 MCG tablet Take by mouth.    . Melatonin 3 MG TABS Take by mouth.    . Multiple Vitamin (MULTI-VITAMINS) TABS Take by mouth.    . Naproxen Sodium (GNP NAPROXEN SODIUM) 220 MG CAPS Take by mouth.    Marland Kitchen omeprazole (PRILOSEC) 20 MG capsule Take by mouth.    . Potassium Gluconate 595 MG CAPS Take by mouth.    . pravastatin (PRAVACHOL) 20 MG tablet Take by mouth.    . tolterodine (DETROL LA) 4 MG 24 hr capsule Take by mouth.     No current facility-administered medications for this visit.      Review of Systems A 10  point review of systems was asked and was negative except for the following positive findings shortness of breath and heart murmur  Blood pressure 152/81, pulse 80, temperature 99 F (37.2 C), temperature source Tympanic, resp. rate 20, height _0  (1.499 m), weight 140 lb 8.7 oz (63.75 kg), SpO2 95 %.  Physical Exam CONSTITUTIONAL:  Pleasant, well-developed, well-nourished, and in no acute distress.  EARS, NOSE, MOUTH AND THROAT:  The oropharynx was clear.  Dentition is good repair.  Oral mucosa pink and moist. LYMPH NODES:  Lymph nodes in the neck and axillae were normal RESPIRATORY:  Lungs were clear.  Normal respiratory effort without pathologic use of accessory muscles of respiration CARDIOVASCULAR: Heart was regular without murmurs.  There were no carotid bruits.  SKIN:  There were no pathologic skin lesions.  There were no nodules on palpation. NEUROLOGIC:  Sensation is normal.  Cranial nerves are grossly intact. PSYCH:  Oriented to person, place and time.  Mood and affect are normal. Data Reviewed I have independently reviewed the patient's CT scan. There is a large sliding hiatal hernia present. There is no evidence of any intrinsic lung disease.  I have personally reviewed the patient's imaging, laboratory findings and medical records.    Assessment    Hiatal hernia I discussed with Mrs. Donia Pounds the role of laparoscopic hiatal hernia repair. She understands that this is not something that I routinely perform and it is something however that a general surgeon would. She is met with Dr. Tamala Julian and is requesting a subsequent evaluation. I will try to arrange that as soon as possible.    Plan    We will arrange for her follow-up as soon as possible.       _1 @ 08/14/2014, 2:04 PM

## 2014-09-05 ENCOUNTER — Encounter: Payer: Self-pay | Admitting: General Surgery

## 2014-09-05 ENCOUNTER — Ambulatory Visit (INDEPENDENT_AMBULATORY_CARE_PROVIDER_SITE_OTHER): Payer: Medicare Other | Admitting: General Surgery

## 2014-09-05 VITALS — BP 141/83 | HR 83 | Temp 98.3°F | Ht 63.0 in | Wt 140.0 lb

## 2014-09-05 DIAGNOSIS — K449 Diaphragmatic hernia without obstruction or gangrene: Secondary | ICD-10-CM

## 2014-09-05 NOTE — Patient Instructions (Signed)
Please go home and think about the surgery. Once you decide on what you want to do, give [place] a call to let us know.

## 2014-09-05 NOTE — Progress Notes (Signed)
Patient ID: Julia Patterson, female   DOB: 11/06/1929, 81 y.o.   MRN: [id number]  HPI Julia Patterson is a 81 y.o. female who presents to clinic today for evaluation of a hiatal hernia. Patient reports that she has been worked up over the past 6 months for evaluation of shortness of breath. During this workup she has had an extensive review the cardiology pulmonology and her primary care. A CT scan was performed last month which showed no definite pulmonary causes of her shortness of breath. It did however show a massive sliding hiatal hernia. Patient reports that during this time her shortness of breath has been primarily unchanged. She develops shortness of breath with almost any activity, she is able to climb 17 stairs and perform all activities of daily living. She does state that she has to stop and rest in order for her to catch her breath after majority of these activities. This shortness of breath is new as of at least December however has not progressed over the last several months. Patient states that she had an out of network cardiology workup to include a stress tests and EKG. The patient reports that the cardiology workup did not find any cardiological cause of her shortness of breath. She also recounts a prolonged history of esophageal reflux disease that has not changed and is well controlled with omeprazole. She reports no difficulty eating no nausea no vomiting and a stable weight. She denies any chest pain or angina like symptoms. She is here today to discuss what surgery for her hiatal hernia would entail and whether or not it is required.  HPI  Past Medical History  Diagnosis Date  . GERD (gastroesophageal reflux disease)   . Hypothyroidism   . Hypercholesterolemia   . Urinary incontinence   . Osteoporosis     s/p fosamax, boniva, reclast, forteo.  bilateral spontaneous femur fractures  . Recurrent cystitis   . Varicose veins     chronic venous insufficiency  .  Degenerative arthritis     lumbar spine.  spondylolisthesis, hip and leg pain, scoliosis  . Carotid artery occlusion   . Nausea   . Shortness of breath dyspnea   . History of hiatal hernia   . Hypertension     Past Surgical History  Procedure Laterality Date  . Tonsillectomy and adenoidectomy  1950  . Varicose vein ligation  1972  . Uterus suspension and sterilization  1963  . Breast reduction surgery  1987  . Hysterectomy and bladder repair  1998  . Varicose vein sclerotherapy      Dr Hulda Humphrey  . L5-l6 fusion  2/07  . Trigger finger repair      right fourth    Family History  Problem Relation Age of Onset  . Breast cancer Daughter   . Rheumatic fever Mother     died age 45  . Heart disease      father's family  . Diabetes      father's family  . Heart attack Father     Social History History  Substance Use Topics  . Smoking status: Never Smoker   . Smokeless tobacco: Never Used  . Alcohol Use: 0.0 oz/week    0 Standard drinks or equivalent per week     Comment: rarely    Allergies  Allergen Reactions  . Thimerosal Rash    Current Outpatient Prescriptions  Medication Sig Dispense Refill  . acetaminophen (RA ACETAMINOPHEN) 650 MG CR tablet Take by mouth.    Marland Kitchen  aspirin EC 81 MG tablet Take by mouth.    . Calcium Citrate-Vitamin D (CALCIUM CITRATE + D) 250-200 MG-UNIT TABS Take by mouth.    . Cholecalciferol (VITAMIN D-1000 MAX ST) 1000 UNITS tablet Take by mouth.    . Coenzyme Q10 100 MG capsule Take 1 or 2 times daily as needed    . fluticasone (FLONASE) 50 MCG/ACT nasal spray Place into the nose.    . levothyroxine (SYNTHROID, LEVOTHROID) 75 MCG tablet Take by mouth.    . Melatonin 3 MG TABS Take by mouth.    . Multiple Vitamin (MULTI-VITAMINS) TABS Take by mouth.    . Naproxen Sodium (GNP NAPROXEN SODIUM) 220 MG CAPS Take by mouth.    Marland Kitchen omeprazole (PRILOSEC) 20 MG capsule Take by mouth.    . Potassium Gluconate 595 MG CAPS Take by mouth.    . pravastatin  (PRAVACHOL) 20 MG tablet Take by mouth.    . tolterodine (DETROL LA) 4 MG 24 hr capsule Take by mouth.     No current facility-administered medications for this visit.     Review of Systems A 10 point review of systems was asked and was negative except for the following positive findings: shortness of breast with activity.  Physical Exam Blood pressure 141/83, pulse 83, temperature 98.3 F (36.8 C), temperature source Oral, height 5\' 3"  (1.6 m), weight 63.504 kg (140 lb). CONSTITUTIONAL: Pleasant well-nourished well-developed white female in no acute distress. Alert and oriented and able to clearly understand all items discussed.Marland Kitchen EYES: Pupils are equal, round, and reactive to light, Sclera are non-icteric. EARS, NOSE, MOUTH AND THROAT: The oropharynx is clear. The oral mucosa is pink and moist. Hearing is intact to voice with hearing aids in place. LYMPH NODES:  Lymph nodes in the neck are grosslyy normal. RESPIRATORY:  Lungs are clear. There is normal respiratory effort, with equal breath sounds bilaterally, and without pathologic use of accessory muscles. CARDIOVASCULAR: Heart is regular in rate and rhthym. GI: The abdomen is soft, nontender, and nondistended. There are no palpable masses. There is no hepatosplenomegaly. There are normal bowel sounds in all quadrants. GU: Rectal deferred.   MUSCULOSKELETAL: Normal muscle strength and tone. No cyanosis or edema.   SKIN: Turgor is good and there are no pathologic skin lesions or ulcers. NEUROLOGIC: Motor and sensation is grossly normal. Cranial nerves are grossly intact. PSYCH:  Oriented to person, place and time. Affect is normal.  Data Reviewed I performed my own independent review of her CT scan images. There is evidence of a large hiatal hernia with approximately 90% of her stomach within her chest cavity. No evidence of obstruction or concerning findings for volvulus or incarceration. I have personally reviewed the patient's  imaging, laboratory findings and medical records.    Assessment    This patient is an 81 year old relatively healthy female with a large hiatal hernia and shortness of breath. The procedure of a surgical repair was discussed in detail to include the risks benefits and alternatives. The procedure specifically described was that of a laparoscopic hiatal hernia repair with mesh reinforcement and anterior gastropexy. The details of the procedure including the length of the operation and the need for inpatient hospitalization postoperatively were discussed and the patient voiced understanding.    Plan    The patient and I had a long conversation about the risks of the procedure and whether or not it would provide her any benefit for her shortness of breath. She was mostly interested in what would  happen if she did not have the surgery. We discussed that although it is unlikely that the hernia would cause any worsening in her breathing it is difficult to predict. Discussed in detail that worsening hiatal hernia symptoms could include gastric outlet obstruction with nausea vomiting food intolerance and weight loss. Also discussed that it is difficult to predict whether or not repair of a hiatal hernia would improve her shortness of breath or if enlarging the hiatal hernia would worsen her shortness of breath. Given all of the options therapies and fact presented to the patient she elects to continue to watch and wait. She is not currently interested in undergoing surgery for problem that she states "I can live with". Patient voiced understanding that for any signs of gastric outlet obstruction that she would report to the emergency room for immediate evaluation. She also stated that should she reconsider and decided that she is interested in undergoing elective repair of this hernia that she would return to clinic for further evaluation.     Time spent with the patient was 45 minutes, with more than 50% of the  time spent in face-to-face education, counseling and care coordination.     Clayburn Pert, MD FACS General Surgeon 09/05/2014, 10:45 AM

## 2014-09-09 DIAGNOSIS — Z961 Presence of intraocular lens: Secondary | ICD-10-CM | POA: Diagnosis not present

## 2014-09-24 ENCOUNTER — Other Ambulatory Visit: Payer: Self-pay

## 2014-09-24 DIAGNOSIS — N200 Calculus of kidney: Secondary | ICD-10-CM

## 2014-10-07 DIAGNOSIS — I6529 Occlusion and stenosis of unspecified carotid artery: Secondary | ICD-10-CM | POA: Diagnosis not present

## 2014-10-07 DIAGNOSIS — I150 Renovascular hypertension: Secondary | ICD-10-CM | POA: Diagnosis not present

## 2014-10-07 DIAGNOSIS — E669 Obesity, unspecified: Secondary | ICD-10-CM | POA: Diagnosis not present

## 2014-10-07 DIAGNOSIS — E785 Hyperlipidemia, unspecified: Secondary | ICD-10-CM | POA: Diagnosis not present

## 2014-10-07 DIAGNOSIS — I6523 Occlusion and stenosis of bilateral carotid arteries: Secondary | ICD-10-CM | POA: Diagnosis not present

## 2014-10-07 DIAGNOSIS — I701 Atherosclerosis of renal artery: Secondary | ICD-10-CM | POA: Diagnosis not present

## 2014-10-07 DIAGNOSIS — I10 Essential (primary) hypertension: Secondary | ICD-10-CM | POA: Diagnosis not present

## 2014-10-13 ENCOUNTER — Encounter: Payer: Self-pay | Admitting: Podiatry

## 2014-10-13 ENCOUNTER — Ambulatory Visit (INDEPENDENT_AMBULATORY_CARE_PROVIDER_SITE_OTHER): Payer: Medicare Other | Admitting: Podiatry

## 2014-10-13 VITALS — BP 115/64 | HR 85 | Resp 16 | Ht 59.0 in | Wt 137.0 lb

## 2014-10-13 DIAGNOSIS — L603 Nail dystrophy: Secondary | ICD-10-CM

## 2014-10-13 NOTE — Progress Notes (Signed)
   Subjective:    Patient ID: Julia Patterson, female    DOB: 24-Aug-1929, 81 y.o.   MRN: [id number]  HPI: She presents today concerned about thick toenails with discoloration hallux bilaterally. She states they do not hurt however she is concerned that there may be something growing underneath them.    Review of Systems  HENT: Positive for hearing loss and trouble swallowing.   Respiratory: Positive for apnea.   Musculoskeletal: Positive for myalgias and arthralgias.  All other systems reviewed and are negative.      Objective:   Physical Exam: 81 year old female no acute distress vital signs are stable alert and oriented 3. Pulses are strongly palpable. Neurologic sensorium is intact per Derrel Nip monofilament. Deep tendon reflexes are intact bilateral and muscle strength +5 over 5 dorsiflexion plantar flexors and inverters everters all inches musculature is intact. Orthopedic evaluation demonstrate solid joints distal to the ankle have a full range of motion without crepitation. Cutaneous evaluation demonstrates onychocryptosis with onychomycosis distal aspect of the hallux nails bilateral. There is no subungual debris just simple release of the nail plate from the nailbed. There not painful on palpation.        Assessment & Plan:  Assessment: Nail dystrophy hallux bilateral.  Plan: Debrided nails 1 through 5 bilateral.

## 2014-10-30 ENCOUNTER — Encounter: Payer: PRIVATE HEALTH INSURANCE | Admitting: Internal Medicine

## 2014-10-30 ENCOUNTER — Ambulatory Visit (INDEPENDENT_AMBULATORY_CARE_PROVIDER_SITE_OTHER): Payer: Medicare Other | Admitting: Internal Medicine

## 2014-10-30 ENCOUNTER — Encounter: Payer: Self-pay | Admitting: Internal Medicine

## 2014-10-30 VITALS — BP 110/70 | HR 73 | Temp 97.8°F | Ht 58.5 in | Wt 140.0 lb

## 2014-10-30 DIAGNOSIS — Z23 Encounter for immunization: Secondary | ICD-10-CM | POA: Diagnosis not present

## 2014-10-30 DIAGNOSIS — K449 Diaphragmatic hernia without obstruction or gangrene: Secondary | ICD-10-CM | POA: Diagnosis not present

## 2014-10-30 DIAGNOSIS — I10 Essential (primary) hypertension: Secondary | ICD-10-CM

## 2014-10-30 DIAGNOSIS — R5383 Other fatigue: Secondary | ICD-10-CM

## 2014-10-30 DIAGNOSIS — E7800 Pure hypercholesterolemia, unspecified: Secondary | ICD-10-CM

## 2014-10-30 DIAGNOSIS — R0602 Shortness of breath: Secondary | ICD-10-CM

## 2014-10-30 DIAGNOSIS — I701 Atherosclerosis of renal artery: Secondary | ICD-10-CM

## 2014-10-30 DIAGNOSIS — K219 Gastro-esophageal reflux disease without esophagitis: Secondary | ICD-10-CM | POA: Diagnosis not present

## 2014-10-30 NOTE — Progress Notes (Signed)
Pre-visit discussion using our clinic review tool. No additional management support is needed unless otherwise documented below in the visit note.  

## 2014-10-30 NOTE — Progress Notes (Signed)
Patient ID: Julia Patterson, female   DOB: 04-Mar-1929, 81 y.o.   MRN: [id number]   Subjective:    Patient ID: Julia Patterson, female    DOB: [birth year], 81 y.o.   MRN: [id number]  HPI  Patient here for a scheduled follow up.  Here to f/u regarding her sob, blood pressure and cholesterol.  She is still having sob with exertion.  Has been evaluated by pulmonary and three surgeons.  Saw the surgeons to have her hiatal hernia evaluated.  Desires no surgical intervention.  Still sob with exertion, but is manageable.  No increased cough or congestion.  No acid reflux.  No abdominal pain or cramping.  Some weakness in her legs.  Discussed therapy.  She declines.  Will let me know if changes her mind.  Discussed f/u with Dr Stevenson Clinch.     Past Medical History  Diagnosis Date  . GERD (gastroesophageal reflux disease)   . Hypothyroidism   . Hypercholesterolemia   . Urinary incontinence   . Osteoporosis     s/p fosamax, boniva, reclast, forteo.  bilateral spontaneous femur fractures  . Recurrent cystitis   . Varicose veins     chronic venous insufficiency  . Degenerative arthritis     lumbar spine.  spondylolisthesis, hip and leg pain, scoliosis  . Carotid artery occlusion   . Nausea   . Shortness of breath dyspnea   . History of hiatal hernia   . Hypertension    Past Surgical History  Procedure Laterality Date  . Tonsillectomy and adenoidectomy  1950  . Varicose vein ligation  1972  . Uterus suspension and sterilization  1963  . Breast reduction surgery  1987  . Hysterectomy and bladder repair  1998  . Varicose vein sclerotherapy      Dr Hulda Humphrey  . L5-l6 fusion  2/07  . Trigger finger repair      right fourth   Family History  Problem Relation Age of Onset  . Breast cancer Daughter   . Rheumatic fever Mother     died age 32  . Heart disease      father's family  . Diabetes      father's family  . Heart attack Father    Social History   Social History  . Marital  Status: Divorced    Spouse Name: N/A  . Number of Children: N/A  . Years of Education: N/A   Social History Main Topics  . Smoking status: Never Smoker   . Smokeless tobacco: Never Used  . Alcohol Use: 0.0 oz/week    0 Standard drinks or equivalent per week     Comment: rarely  . Drug Use: No  . Sexual Activity: Not Asked   Other Topics Concern  . None   Social History Narrative    Outpatient Encounter Prescriptions as of 10/30/2014  Medication Sig  . acetaminophen (RA ACETAMINOPHEN) 650 MG CR tablet Take by mouth.  Marland Kitchen aspirin EC 81 MG tablet Take by mouth.  . Calcium Citrate-Vitamin D (CALCIUM CITRATE + D) 250-200 MG-UNIT TABS Take by mouth.  . Cholecalciferol (VITAMIN D-1000 MAX ST) 1000 UNITS tablet Take by mouth.  . Coenzyme Q10 100 MG capsule Take 1 or 2 times daily as needed  . fluticasone (FLONASE) 50 MCG/ACT nasal spray Place into the nose.  . levothyroxine (SYNTHROID, LEVOTHROID) 75 MCG tablet Take by mouth.  . Melatonin 3 MG TABS Take by mouth.  . Multiple Vitamin (MULTI-VITAMINS) TABS Take by mouth.  . Naproxen  Sodium (GNP NAPROXEN SODIUM) 220 MG CAPS Take by mouth.  Marland Kitchen omeprazole (PRILOSEC) 20 MG capsule Take by mouth.  . Potassium Gluconate 595 MG CAPS Take by mouth.  . pravastatin (PRAVACHOL) 20 MG tablet Take by mouth.  . tolterodine (DETROL LA) 4 MG 24 hr capsule Take by mouth.   No facility-administered encounter medications on file as of 10/30/2014.    Review of Systems  Constitutional: Negative for appetite change and unexpected weight change.  HENT: Negative for congestion and sinus pressure.   Eyes: Negative for pain and visual disturbance.  Respiratory: Positive for shortness of breath (notices sob with exertion. ). Negative for cough and chest tightness.   Cardiovascular: Negative for chest pain, palpitations and leg swelling.  Gastrointestinal: Negative for nausea, vomiting, abdominal pain and diarrhea.  Genitourinary: Negative for dysuria and  difficulty urinating.  Musculoskeletal: Negative for myalgias and joint swelling.  Skin: Negative for color change and rash.  Neurological: Negative for dizziness, light-headedness and headaches.  Hematological: Negative for adenopathy. Does not bruise/bleed easily.  Psychiatric/Behavioral: Negative for dysphoric mood and agitation.       Objective:    Physical Exam  Constitutional: She is oriented to person, place, and time. She appears well-developed and well-nourished. No distress.  HENT:  Nose: Nose normal.  Mouth/Throat: Oropharynx is clear and moist.  Eyes: Right eye exhibits no discharge. Left eye exhibits no discharge. No scleral icterus.  Neck: Neck supple. No thyromegaly present.  Cardiovascular: Normal rate and regular rhythm.   Pulmonary/Chest: Breath sounds normal. No accessory muscle usage. No tachypnea. No respiratory distress. She has no decreased breath sounds. She has no wheezes. She has no rhonchi. Right breast exhibits no inverted nipple, no mass, no nipple discharge and no tenderness (no axillary adenopathy). Left breast exhibits no inverted nipple, no mass, no nipple discharge and no tenderness (no axilarry adenopathy).  Abdominal: Soft. Bowel sounds are normal. There is no tenderness.  Musculoskeletal: She exhibits no edema or tenderness.  Lymphadenopathy:    She has no cervical adenopathy.  Neurological: She is alert and oriented to person, place, and time.  Skin: Skin is warm. No rash noted. No erythema.  Psychiatric: She has a normal mood and affect. Her behavior is normal.    BP 110/70 mmHg  Pulse 73  Temp(Src) 97.8 F (36.6 C) (Oral)  Ht 4' 10.5" (1.486 m)  Wt 140 lb (63.504 kg)  BMI 28.76 kg/m2  SpO2 97% Wt Readings from Last 3 Encounters:  10/30/14 140 lb (63.504 kg)  10/13/14 137 lb (62.143 kg)  09/05/14 140 lb (63.504 kg)     Lab Results  Component Value Date   WBC 6.4 06/19/2013   HGB 15.0 06/19/2013   HCT 44.3 06/19/2013   PLT 254.0  06/19/2013   GLUCOSE 82 03/27/2014   CHOL 173 03/27/2014   TRIG 127.0 03/27/2014   HDL 44.60 03/27/2014   LDLCALC 103* 03/27/2014   ALT 16 03/27/2014   AST 19 03/27/2014   NA 141 03/27/2014   K 4.2 03/27/2014   CL 107 03/27/2014   CREATININE 1.04 03/27/2014   BUN 23 03/27/2014   CO2 29 03/27/2014   TSH 2.05 06/18/2014    Ct Chest High Resolution  06/24/2014   CLINICAL DATA:  81 year old female with worsening shortness of breath for the past 3-4 months.  EXAM: CT CHEST WITHOUT CONTRAST  TECHNIQUE: Multidetector CT imaging of the chest was performed following the standard protocol without intravenous contrast. High resolution imaging of the lungs, as  well as inspiratory and expiratory imaging, was performed.  COMPARISON:  No priors.  FINDINGS: Mediastinum/Lymph Nodes: Heart size is normal. Trace amount of pericardial fluid and/or thickening, unlikely to be of any hemodynamic significance at this time. No associated pericardial calcification. There is atherosclerosis of the thoracic aorta, the great vessels of the mediastinum and the coronary arteries, including calcified atherosclerotic plaque in the left main, left anterior descending, left circumflex and right coronary arteries. Calcifications of the mitral annulus. No pathologically enlarged mediastinal or hilar lymph nodes. Please note that accurate exclusion of hilar adenopathy is limited on noncontrast CT scans. Large hiatal hernia. No axillary lymphadenopathy.  Lungs/Pleura: 4 mm subpleural nodule in the right lower lobe (image 31 of series 3). 4 mm subpleural nodule in the periphery of the left upper lobe (image 23 of series 3). No larger more suspicious appearing pulmonary nodules or masses are otherwise noted. There are areas of architectural distortion with some associated cylindrical and mild varicose bronchiectasis in the lower lobes of the lungs bilaterally, favored to reflect areas of chronic post infectious or inflammatory scarring.  High-resolution images demonstrate a small amount of basilar peribronchovascular ground-glass attenuation with some mild septal thickening and peripheral bronchiolectasis in the basal segments of the lower lobes of the lungs bilaterally, the appearance of which suggests sequela of recurrent aspiration. No other areas of subpleural reticulation, parenchymal banding, traction bronchiectasis or frank honeycombing are noted to suggest concurrent interstitial lung disease. Inspiratory and expiratory imaging is remarkable for some mild air trapping, indicative of small airways disease. No acute consolidative airspace disease. No pleural effusions.  Upper Abdomen: Scarring in the upper pole of the left kidney. 4 mm nonobstructive calculus in the upper pole collecting system of the left kidney.  Musculoskeletal/Soft Tissues: There are no aggressive appearing lytic or blastic lesions noted in the visualized portions of the skeleton. Kyphosis of the thoracic spine.  IMPRESSION: 1. Massive hiatal hernia with evidence of mild fibrosis in the lung bases bilaterally, likely secondary to recurrent aspiration, as discussed above. 2. No other findings to suggest superimposed interstitial lung disease at this time. 3. Atherosclerosis, including left main and 3 vessel coronary artery disease. 4. A 4 mm nonobstructive calculus in the upper pole collecting system of the left kidney. Extensive scarring in the upper pole of the left kidney also noted.   Electronically Signed   By: Vinnie Langton M.D.   On: 06/24/2014 13:12       Assessment & Plan:   Problem List Items Addressed This Visit    Essential hypertension, benign    Blood pressure under good control.  Continue same medication regimen.  Follow pressures.  Follow metabolic panel.        Relevant Orders   CBC with Differential/Platelet   Basic metabolic panel   GERD (gastroesophageal reflux disease)    Acid reflux controlled.        Hiatal hernia    CT scan as  outlined.  Has seen three surgeons.  Not planning to proceed with surgery.  Follow.        Hypercholesterolemia    Low cholesterol diet and exercise.  Follow lipid panel and liver function tests.  On pravastatin.       Relevant Orders   Lipid panel   Hepatic function panel   Renal artery stenosis    Followed by [facility].  Has been stable.  Follow.        SOB (shortness of breath)    CT scan as outlined.  Has the hiatal hernia.  Felt not to be a surgical candidate.  Will plan f/u with Dr Stevenson Clinch.         Other Visit Diagnoses    Encounter for immunization    -  Primary    Other fatigue        Relevant Orders    TSH        Einar Pheasant, MD

## 2014-11-01 ENCOUNTER — Encounter: Payer: Self-pay | Admitting: Internal Medicine

## 2014-11-01 NOTE — Assessment & Plan Note (Signed)
CT scan as outlined.  Has seen three surgeons.  Not planning to proceed with surgery.  Follow.

## 2014-11-01 NOTE — Assessment & Plan Note (Signed)
CT scan as outlined.  Has the hiatal hernia.  Felt not to be a surgical candidate.  Will plan f/u with Dr Stevenson Clinch.

## 2014-11-01 NOTE — Assessment & Plan Note (Signed)
Followed by [facility].  Has been stable.  Follow.

## 2014-11-01 NOTE — Assessment & Plan Note (Signed)
Low cholesterol diet and exercise.  Follow lipid panel and liver function tests.  On pravastatin.   

## 2014-11-01 NOTE — Assessment & Plan Note (Signed)
Blood pressure under good control.  Continue same medication regimen.  Follow pressures.  Follow metabolic panel.   

## 2014-11-01 NOTE — Assessment & Plan Note (Signed)
Acid reflux controlled.   

## 2014-11-02 ENCOUNTER — Encounter: Payer: Self-pay | Admitting: Internal Medicine

## 2014-11-02 DIAGNOSIS — Z0000 Encounter for general adult medical examination without abnormal findings: Secondary | ICD-10-CM | POA: Insufficient documentation

## 2014-11-20 ENCOUNTER — Other Ambulatory Visit: Payer: Self-pay | Admitting: Internal Medicine

## 2014-12-19 DIAGNOSIS — I150 Renovascular hypertension: Secondary | ICD-10-CM | POA: Diagnosis not present

## 2014-12-19 DIAGNOSIS — I701 Atherosclerosis of renal artery: Secondary | ICD-10-CM | POA: Diagnosis not present

## 2014-12-19 DIAGNOSIS — I6529 Occlusion and stenosis of unspecified carotid artery: Secondary | ICD-10-CM | POA: Diagnosis not present

## 2014-12-19 DIAGNOSIS — I10 Essential (primary) hypertension: Secondary | ICD-10-CM | POA: Diagnosis not present

## 2014-12-19 DIAGNOSIS — E669 Obesity, unspecified: Secondary | ICD-10-CM | POA: Diagnosis not present

## 2014-12-19 DIAGNOSIS — E785 Hyperlipidemia, unspecified: Secondary | ICD-10-CM | POA: Diagnosis not present

## 2014-12-19 DIAGNOSIS — I6523 Occlusion and stenosis of bilateral carotid arteries: Secondary | ICD-10-CM | POA: Diagnosis not present

## 2015-01-20 ENCOUNTER — Telehealth: Payer: Self-pay | Admitting: *Deleted

## 2015-01-20 ENCOUNTER — Other Ambulatory Visit: Payer: Self-pay

## 2015-01-20 MED ORDER — PRAVASTATIN SODIUM 20 MG PO TABS: 20.0000 mg | ORAL_TABLET | Freq: Every day | ORAL | Status: DC

## 2015-01-20 NOTE — Telephone Encounter (Signed)
Patient requested a medication refill for pravastatin, Please advise Please use [facility]

## 2015-01-20 NOTE — Telephone Encounter (Signed)
Call into the pharamcy

## 2015-02-11 ENCOUNTER — Encounter: Payer: Self-pay | Admitting: *Deleted

## 2015-02-16 DIAGNOSIS — N390 Urinary tract infection, site not specified: Secondary | ICD-10-CM | POA: Diagnosis not present

## 2015-02-17 DIAGNOSIS — M65342 Trigger finger, left ring finger: Secondary | ICD-10-CM | POA: Diagnosis not present

## 2015-03-03 ENCOUNTER — Ambulatory Visit: Payer: PRIVATE HEALTH INSURANCE | Admitting: Internal Medicine

## 2015-03-18 DIAGNOSIS — M65342 Trigger finger, left ring finger: Secondary | ICD-10-CM | POA: Diagnosis not present

## 2015-04-10 DIAGNOSIS — I150 Renovascular hypertension: Secondary | ICD-10-CM | POA: Diagnosis not present

## 2015-04-10 DIAGNOSIS — E669 Obesity, unspecified: Secondary | ICD-10-CM | POA: Diagnosis not present

## 2015-04-10 DIAGNOSIS — I6529 Occlusion and stenosis of unspecified carotid artery: Secondary | ICD-10-CM | POA: Diagnosis not present

## 2015-04-10 DIAGNOSIS — E785 Hyperlipidemia, unspecified: Secondary | ICD-10-CM | POA: Diagnosis not present

## 2015-04-10 DIAGNOSIS — I10 Essential (primary) hypertension: Secondary | ICD-10-CM | POA: Diagnosis not present

## 2015-04-10 DIAGNOSIS — I6523 Occlusion and stenosis of bilateral carotid arteries: Secondary | ICD-10-CM | POA: Diagnosis not present

## 2015-04-10 DIAGNOSIS — I701 Atherosclerosis of renal artery: Secondary | ICD-10-CM | POA: Diagnosis not present

## 2015-04-17 ENCOUNTER — Other Ambulatory Visit: Payer: Self-pay | Admitting: Internal Medicine

## 2015-04-20 ENCOUNTER — Telehealth: Payer: Self-pay

## 2015-04-20 DIAGNOSIS — N3281 Overactive bladder: Secondary | ICD-10-CM

## 2015-04-20 NOTE — Telephone Encounter (Signed)
Pt called stating shes not able to afford tolterodine and her pharmacist advised her to take oxybutynin bid. Please advise.

## 2015-04-20 NOTE — Telephone Encounter (Signed)
Call me crazy, but I don't see where I've seen her in the recent past.  Is she on the old EMR?  If seen in the last year and tolterodine was working, lets switch to oxybutynin 10 mg XL daily.    Hollice Espy, MD

## 2015-04-21 ENCOUNTER — Other Ambulatory Visit: Payer: Self-pay

## 2015-04-21 DIAGNOSIS — N200 Calculus of kidney: Secondary | ICD-10-CM

## 2015-04-21 MED ORDER — OXYBUTYNIN CHLORIDE ER 10 MG PO TB24: 10.0000 mg | ORAL_TABLET | Freq: Every day | ORAL | Status: DC

## 2015-04-21 NOTE — Telephone Encounter (Signed)
Spoke with pt in reference to medications. Made aware oxybutynin was sent to pharmacy. Per Dr. Erlene Quan pt should f/u in 13mo. Pt voiced understanding. Pt was transferred to the front to make appt.

## 2015-04-24 ENCOUNTER — Telehealth: Payer: Self-pay | Admitting: Urology

## 2015-04-24 ENCOUNTER — Ambulatory Visit: Payer: Medicare Other | Admitting: Urology

## 2015-04-24 DIAGNOSIS — N3281 Overactive bladder: Secondary | ICD-10-CM

## 2015-04-24 MED ORDER — OXYBUTYNIN CHLORIDE 5 MG PO TABS: 5.0000 mg | ORAL_TABLET | Freq: Two times a day (BID) | ORAL | Status: DC

## 2015-04-24 NOTE — Telephone Encounter (Signed)
Spoke with pt who stated that she would prefer to have the regular oxybutynin that is not extended release due to cost. Pt stated she would rather pay $8 and take the medication bid instead of $83 and take medication once a day. Please advise.

## 2015-04-24 NOTE — Telephone Encounter (Signed)
Totally fine.    Julia Warehime, MD  

## 2015-04-24 NOTE — Telephone Encounter (Signed)
Spoke with pt in reference to medication. Pt voiced understanding. New meds sent to pharmacy.

## 2015-04-24 NOTE — Telephone Encounter (Signed)
Patient called the office this morning.  She has spoken to the [facility] and been advised that the oxybutinin ER has been called in for her.  Patient is stating that she does not want the ER form of this medication.  She would prefer to take 1 tablet twice a day.  Can we change this prescription for her?  Please advise the patient.

## 2015-04-29 ENCOUNTER — Encounter: Payer: Self-pay | Admitting: Internal Medicine

## 2015-04-29 ENCOUNTER — Ambulatory Visit (INDEPENDENT_AMBULATORY_CARE_PROVIDER_SITE_OTHER): Payer: Medicare Other | Admitting: Internal Medicine

## 2015-04-29 VITALS — BP 130/80 | HR 76 | Resp 17 | Ht 58.5 in | Wt 138.8 lb

## 2015-04-29 DIAGNOSIS — R0602 Shortness of breath: Secondary | ICD-10-CM

## 2015-04-29 DIAGNOSIS — K449 Diaphragmatic hernia without obstruction or gangrene: Secondary | ICD-10-CM | POA: Diagnosis not present

## 2015-04-29 DIAGNOSIS — I10 Essential (primary) hypertension: Secondary | ICD-10-CM | POA: Diagnosis not present

## 2015-04-29 DIAGNOSIS — I739 Peripheral vascular disease, unspecified: Secondary | ICD-10-CM

## 2015-04-29 DIAGNOSIS — I701 Atherosclerosis of renal artery: Secondary | ICD-10-CM

## 2015-04-29 DIAGNOSIS — I779 Disorder of arteries and arterioles, unspecified: Secondary | ICD-10-CM

## 2015-04-29 DIAGNOSIS — R1012 Left upper quadrant pain: Secondary | ICD-10-CM

## 2015-04-29 DIAGNOSIS — E039 Hypothyroidism, unspecified: Secondary | ICD-10-CM

## 2015-04-29 DIAGNOSIS — E7800 Pure hypercholesterolemia, unspecified: Secondary | ICD-10-CM

## 2015-04-29 DIAGNOSIS — K219 Gastro-esophageal reflux disease without esophagitis: Secondary | ICD-10-CM

## 2015-04-29 NOTE — Progress Notes (Signed)
Patient ID: Julia Patterson, female   DOB: 02-12-1930, 82 y.o.   MRN: [id number]   Subjective:    Patient ID: Julia Patterson, female    DOB: [birth year], 82 y.o.   MRN: [id number]  HPI  Patient with past history of hypercholesterolemia, GERD, hypertension and hypothyroidism.  She comes in today to follow up on these issues.  She is s/p release of left trigger finger.  Doing better.  She has had an extensive w/up for her sob.  Was found to have a massive hiatal hernia.  Has talked to a couple of surgeons.  Not felt to be a good surgical candidate.  Saw cardiology.  Had negative stress test.  Breathing stable.  Still with sob with exertion - stable.  Eating and drinking well.  Bowels stable.     Past Medical History  Diagnosis Date  . GERD (gastroesophageal reflux disease)   . Hypothyroidism   . Hypercholesterolemia   . Urinary incontinence   . Osteoporosis     s/p fosamax, boniva, reclast, forteo.  bilateral spontaneous femur fractures  . Recurrent cystitis   . Varicose veins     chronic venous insufficiency  . Degenerative arthritis     lumbar spine.  spondylolisthesis, hip and leg pain, scoliosis  . Carotid artery occlusion   . Nausea   . Shortness of breath dyspnea   . History of hiatal hernia   . Hypertension    Past Surgical History  Procedure Laterality Date  . Tonsillectomy and adenoidectomy  1950  . Varicose vein ligation  1972  . Uterus suspension and sterilization  1963  . Breast reduction surgery  1987  . Hysterectomy and bladder repair  1998  . Varicose vein sclerotherapy      Dr Hulda Humphrey  . L5-l6 fusion  2/07  . Trigger finger repair      right fourth   Family History  Problem Relation Age of Onset  . Breast cancer Daughter   . Rheumatic fever Mother     died age 30  . Heart disease      father's family  . Diabetes      father's family  . Heart attack Father    Social History   Social History  . Marital Status: Divorced    Spouse Name: N/A    . Number of Children: N/A  . Years of Education: N/A   Social History Main Topics  . Smoking status: Never Smoker   . Smokeless tobacco: Never Used  . Alcohol Use: 0.0 oz/week    0 Standard drinks or equivalent per week     Comment: rarely  . Drug Use: No  . Sexual Activity: Not Asked   Other Topics Concern  . None   Social History Narrative    Outpatient Encounter Prescriptions as of 04/29/2015  Medication Sig  . acetaminophen (RA ACETAMINOPHEN) 650 MG CR tablet Take by mouth.  Marland Kitchen aspirin EC 81 MG tablet Take by mouth.  . Calcium Citrate-Vitamin D (CALCIUM CITRATE + D) 250-200 MG-UNIT TABS Take by mouth.  . Cholecalciferol (VITAMIN D-1000 MAX ST) 1000 UNITS tablet Take by mouth.  . Coenzyme Q10 100 MG capsule Take 1 or 2 times daily as needed  . fluticasone (FLONASE) 50 MCG/ACT nasal spray Place into the nose.  . levothyroxine (SYNTHROID, LEVOTHROID) 75 MCG tablet TAKE 1 TABLET EVERY DAY  . Melatonin 3 MG TABS Take by mouth.  . Multiple Vitamin (MULTI-VITAMINS) TABS Take by mouth.  . Naproxen Sodium (GNP  NAPROXEN SODIUM) 220 MG CAPS Take by mouth.  Marland Kitchen omeprazole (PRILOSEC) 20 MG capsule TAKE 1 CAPSULE (20 MG TOTAL) BY MOUTH DAILY.  Marland Kitchen Potassium Gluconate 595 MG CAPS Take by mouth.  . pravastatin (PRAVACHOL) 20 MG tablet Take 1 tablet (20 mg total) by mouth daily.  . [DISCONTINUED] tolterodine (DETROL LA) 4 MG 24 hr capsule Take by mouth.  . oxybutynin (DITROPAN) 5 MG tablet Take 1 tablet (5 mg total) by mouth 2 (two) times daily. (Patient not taking: Reported on 04/29/2015)  . [DISCONTINUED] oxybutynin (DITROPAN-XL) 10 MG 24 hr tablet Take 1 tablet (10 mg total) by mouth daily.   No facility-administered encounter medications on file as of 04/29/2015.    Review of Systems  Constitutional: Negative for appetite change and unexpected weight change.  HENT: Negative for congestion and sinus pressure.   Respiratory: Positive for shortness of breath. Negative for cough and chest  tightness.   Cardiovascular: Negative for chest pain, palpitations and leg swelling.  Gastrointestinal: Negative for nausea, vomiting and diarrhea.  Genitourinary: Negative for dysuria and difficulty urinating.  Musculoskeletal: Negative for back pain and joint swelling.  Skin: Negative for color change and rash.  Neurological: Negative for dizziness, light-headedness and headaches.  Psychiatric/Behavioral: Negative for dysphoric mood and agitation.       Objective:    Physical Exam  Constitutional: She appears well-developed and well-nourished. No distress.  HENT:  Nose: Nose normal.  Mouth/Throat: Oropharynx is clear and moist.  Neck: Neck supple. No thyromegaly present.  Cardiovascular: Normal rate and regular rhythm.   Pulmonary/Chest: Breath sounds normal. No respiratory distress. She has no wheezes.  Abdominal: Soft. Bowel sounds are normal. There is no tenderness.  Musculoskeletal: She exhibits no edema or tenderness.  Lymphadenopathy:    She has no cervical adenopathy.  Skin: No rash noted. No erythema.  Psychiatric: She has a normal mood and affect. Her behavior is normal.    BP 130/80 mmHg  Pulse 76  Resp 17  Ht 4' 10.5" (1.486 m)  Wt 138 lb 12 oz (62.937 kg)  BMI 28.50 kg/m2  SpO2 96% Wt Readings from Last 3 Encounters:  04/29/15 138 lb 12 oz (62.937 kg)  10/30/14 140 lb (63.504 kg)  10/13/14 137 lb (62.143 kg)     Lab Results  Component Value Date   WBC 6.4 06/19/2013   HGB 15.0 06/19/2013   HCT 44.3 06/19/2013   PLT 254.0 06/19/2013   GLUCOSE 82 03/27/2014   CHOL 173 03/27/2014   TRIG 127.0 03/27/2014   HDL 44.60 03/27/2014   LDLCALC 103* 03/27/2014   ALT 16 03/27/2014   AST 19 03/27/2014   NA 141 03/27/2014   K 4.2 03/27/2014   CL 107 03/27/2014   CREATININE 1.04 03/27/2014   BUN 23 03/27/2014   CO2 29 03/27/2014   TSH 2.05 06/18/2014    Ct Chest High Resolution  06/24/2014  CLINICAL DATA:  82 year old female with worsening shortness of  breath for the past 3-4 months. EXAM: CT CHEST WITHOUT CONTRAST TECHNIQUE: Multidetector CT imaging of the chest was performed following the standard protocol without intravenous contrast. High resolution imaging of the lungs, as well as inspiratory and expiratory imaging, was performed. COMPARISON:  No priors. FINDINGS: Mediastinum/Lymph Nodes: Heart size is normal. Trace amount of pericardial fluid and/or thickening, unlikely to be of any hemodynamic significance at this time. No associated pericardial calcification. There is atherosclerosis of the thoracic aorta, the great vessels of the mediastinum and the coronary arteries, including calcified  atherosclerotic plaque in the left main, left anterior descending, left circumflex and right coronary arteries. Calcifications of the mitral annulus. No pathologically enlarged mediastinal or hilar lymph nodes. Please note that accurate exclusion of hilar adenopathy is limited on noncontrast CT scans. Large hiatal hernia. No axillary lymphadenopathy. Lungs/Pleura: 4 mm subpleural nodule in the right lower lobe (image 31 of series 3). 4 mm subpleural nodule in the periphery of the left upper lobe (image 23 of series 3). No larger more suspicious appearing pulmonary nodules or masses are otherwise noted. There are areas of architectural distortion with some associated cylindrical and mild varicose bronchiectasis in the lower lobes of the lungs bilaterally, favored to reflect areas of chronic post infectious or inflammatory scarring. High-resolution images demonstrate a small amount of basilar peribronchovascular ground-glass attenuation with some mild septal thickening and peripheral bronchiolectasis in the basal segments of the lower lobes of the lungs bilaterally, the appearance of which suggests sequela of recurrent aspiration. No other areas of subpleural reticulation, parenchymal banding, traction bronchiectasis or frank honeycombing are noted to suggest concurrent  interstitial lung disease. Inspiratory and expiratory imaging is remarkable for some mild air trapping, indicative of small airways disease. No acute consolidative airspace disease. No pleural effusions. Upper Abdomen: Scarring in the upper pole of the left kidney. 4 mm nonobstructive calculus in the upper pole collecting system of the left kidney. Musculoskeletal/Soft Tissues: There are no aggressive appearing lytic or blastic lesions noted in the visualized portions of the skeleton. Kyphosis of the thoracic spine. IMPRESSION: 1. Massive hiatal hernia with evidence of mild fibrosis in the lung bases bilaterally, likely secondary to recurrent aspiration, as discussed above. 2. No other findings to suggest superimposed interstitial lung disease at this time. 3. Atherosclerosis, including left main and 3 vessel coronary artery disease. 4. A 4 mm nonobstructive calculus in the upper pole collecting system of the left kidney. Extensive scarring in the upper pole of the left kidney also noted. Electronically Signed   By: Vinnie Langton M.D.   On: 06/24/2014 13:12       Assessment & Plan:   Problem List Items Addressed This Visit    Carotid artery disease ([place])    Followed by Dr Lucky Cowboy.  Continue risk factor modification.        Essential hypertension, benign    Blood pressure under good control.  Continue same medication regimen.  Follow pressures.  Follow metabolic panel.        GERD (gastroesophageal reflux disease)    Acid reflux controlled.        Hiatal hernia    Has a massive hiatal hernia.  Has seen three surgeons.  Not felt to be a good surgical candidate.  Follow.        Hypercholesterolemia    Low cholesterol diet and exercise.  On pravastatin.  Follow lipid panel and liver function tests.        Hypothyroidism    On thyroid replacement.  Follow tsh.        Left upper quadrant pain    Intermittent.  No significant pain.  Desires no further intervention.        Renal artery  stenosis ([facility]) - Primary    Followed by [facility].  Stable.        SOB (shortness of breath)    CT as outlined in overview.  Massive hiatal hernia.  Felt not to be a surgical candidate.  Has seen pulmonary and cardiology.  PFTs as outlined in overview.  Stress  test negative.  Currently stable.  Follow.  Desires no further intervention.            Einar Pheasant, MD

## 2015-04-29 NOTE — Progress Notes (Signed)
Pre-visit discussion using our clinic review tool. No additional management support is needed unless otherwise documented below in the visit note.  

## 2015-05-10 ENCOUNTER — Encounter: Payer: Self-pay | Admitting: Internal Medicine

## 2015-05-10 NOTE — Assessment & Plan Note (Signed)
Blood pressure under good control.  Continue same medication regimen.  Follow pressures.  Follow metabolic panel.   

## 2015-05-10 NOTE — Assessment & Plan Note (Signed)
Low cholesterol diet and exercise.  On pravastatin.  Follow lipid panel and liver function tests.   

## 2015-05-10 NOTE — Assessment & Plan Note (Signed)
CT as outlined in overview.  Massive hiatal hernia.  Felt not to be a surgical candidate.  Has seen pulmonary and cardiology.  PFTs as outlined in overview.  Stress test negative.  Currently stable.  Follow.  Desires no further intervention.

## 2015-05-10 NOTE — Assessment & Plan Note (Signed)
Acid reflux controlled.   

## 2015-05-10 NOTE — Assessment & Plan Note (Signed)
On thyroid replacement.  Follow tsh.  

## 2015-05-10 NOTE — Assessment & Plan Note (Signed)
Intermittent.  No significant pain.  Desires no further intervention.

## 2015-05-10 NOTE — Assessment & Plan Note (Signed)
Has a massive hiatal hernia.  Has seen three surgeons.  Not felt to be a good surgical candidate.  Follow.

## 2015-05-10 NOTE — Assessment & Plan Note (Signed)
Followed by Dr Dew.  Continue risk factor modification.  

## 2015-05-10 NOTE — Assessment & Plan Note (Signed)
Followed by [facility].  Stable.   

## 2015-05-19 ENCOUNTER — Ambulatory Visit: Admission: RE | Admit: 2015-05-19 | Payer: Medicare Other | Source: Ambulatory Visit

## 2015-05-21 ENCOUNTER — Other Ambulatory Visit: Payer: Medicare Other

## 2015-05-27 ENCOUNTER — Other Ambulatory Visit (INDEPENDENT_AMBULATORY_CARE_PROVIDER_SITE_OTHER): Payer: Medicare Other

## 2015-05-27 ENCOUNTER — Ambulatory Visit: Payer: Medicare Other | Admitting: Urology

## 2015-05-27 DIAGNOSIS — E7800 Pure hypercholesterolemia, unspecified: Secondary | ICD-10-CM

## 2015-05-27 DIAGNOSIS — R5383 Other fatigue: Secondary | ICD-10-CM

## 2015-05-27 DIAGNOSIS — I10 Essential (primary) hypertension: Secondary | ICD-10-CM

## 2015-05-27 LAB — TSH: TSH: 2.08 u[IU]/mL (ref 0.35–4.50)

## 2015-05-27 LAB — CBC WITH DIFFERENTIAL/PLATELET
Basophils Absolute: 0 10*3/uL (ref 0.0–0.1)
Basophils Relative: 0.7 % (ref 0.0–3.0)
Eosinophils Absolute: 0.2 10*3/uL (ref 0.0–0.7)
Eosinophils Relative: 3.4 % (ref 0.0–5.0)
HEMATOCRIT: 45.6 % (ref 36.0–46.0)
Hemoglobin: 15.3 g/dL — ABNORMAL HIGH (ref 12.0–15.0)
Lymphocytes Relative: 27.9 % (ref 12.0–46.0)
Lymphs Abs: 1.6 10*3/uL (ref 0.7–4.0)
MCHC: 33.5 g/dL (ref 30.0–36.0)
MCV: 93.9 fl (ref 78.0–100.0)
Monocytes Absolute: 0.5 10*3/uL (ref 0.1–1.0)
Monocytes Relative: 8.5 % (ref 3.0–12.0)
NEUTROS ABS: 3.5 10*3/uL (ref 1.4–7.7)
Neutrophils Relative %: 59.5 % (ref 43.0–77.0)
Platelets: 219 10*3/uL (ref 150.0–400.0)
RBC: 4.86 Mil/uL (ref 3.87–5.11)
RDW: 13.3 % (ref 11.5–15.5)
WBC: 5.8 10*3/uL (ref 4.0–10.5)

## 2015-05-27 LAB — BASIC METABOLIC PANEL
BUN: 20 mg/dL (ref 6–23)
CALCIUM: 10.4 mg/dL (ref 8.4–10.5)
CO2: 30 mEq/L (ref 19–32)
CREATININE: 1.03 mg/dL (ref 0.40–1.20)
Chloride: 109 mEq/L (ref 96–112)
GFR: 54.33 mL/min — AB (ref >60.00)
Glucose, Bld: 83 mg/dL (ref 70–99)
Potassium: 4.1 mEq/L (ref 3.5–5.1)
Sodium: 146 mEq/L — ABNORMAL HIGH (ref 135–145)

## 2015-05-27 LAB — HEPATIC FUNCTION PANEL
ALK PHOS: 47 U/L (ref 39–117)
ALT: 13 U/L (ref 0–35)
AST: 17 U/L (ref 0–37)
Albumin: 4.3 g/dL (ref 3.5–5.2)
BILIRUBIN DIRECT: 0.1 mg/dL (ref 0.0–0.3)
TOTAL PROTEIN: 6.9 g/dL (ref 6.0–8.3)
Total Bilirubin: 0.6 mg/dL (ref 0.2–1.2)

## 2015-05-27 LAB — LIPID PANEL
CHOLESTEROL: 163 mg/dL (ref 0–200)
HDL: 44 mg/dL (ref >39.00)
LDL CALC: 94 mg/dL (ref 0–99)
NonHDL: 118.99
TRIGLYCERIDES: 123 mg/dL (ref 0.0–149.0)
Total CHOL/HDL Ratio: 4
VLDL: 24.6 mg/dL (ref 0.0–40.0)

## 2015-05-28 ENCOUNTER — Encounter: Payer: Self-pay | Admitting: *Deleted

## 2015-05-28 ENCOUNTER — Ambulatory Visit
Admission: RE | Admit: 2015-05-28 | Discharge: 2015-05-28 | Disposition: A | Discharge status: Home / Self Care | Payer: Medicare Other | Source: Ambulatory Visit | Attending: Urology | Admitting: Urology

## 2015-05-28 DIAGNOSIS — N200 Calculus of kidney: Secondary | ICD-10-CM | POA: Insufficient documentation

## 2015-06-03 ENCOUNTER — Other Ambulatory Visit: Payer: Self-pay | Admitting: Internal Medicine

## 2015-06-23 ENCOUNTER — Encounter: Payer: Self-pay | Admitting: Urology

## 2015-06-23 ENCOUNTER — Ambulatory Visit (INDEPENDENT_AMBULATORY_CARE_PROVIDER_SITE_OTHER): Payer: Medicare Other | Admitting: Urology

## 2015-06-23 VITALS — BP 139/73 | HR 79 | Ht 59.0 in | Wt 138.0 lb

## 2015-06-23 DIAGNOSIS — N3941 Urge incontinence: Secondary | ICD-10-CM

## 2015-06-23 DIAGNOSIS — I10 Essential (primary) hypertension: Secondary | ICD-10-CM | POA: Diagnosis not present

## 2015-06-23 DIAGNOSIS — E785 Hyperlipidemia, unspecified: Secondary | ICD-10-CM | POA: Diagnosis not present

## 2015-06-23 DIAGNOSIS — I6523 Occlusion and stenosis of bilateral carotid arteries: Secondary | ICD-10-CM | POA: Diagnosis not present

## 2015-06-23 DIAGNOSIS — E669 Obesity, unspecified: Secondary | ICD-10-CM | POA: Diagnosis not present

## 2015-06-23 DIAGNOSIS — N200 Calculus of kidney: Secondary | ICD-10-CM

## 2015-06-23 DIAGNOSIS — N261 Atrophy of kidney (terminal): Secondary | ICD-10-CM

## 2015-06-23 DIAGNOSIS — I701 Atherosclerosis of renal artery: Secondary | ICD-10-CM

## 2015-06-23 DIAGNOSIS — N3281 Overactive bladder: Secondary | ICD-10-CM

## 2015-06-23 DIAGNOSIS — I150 Renovascular hypertension: Secondary | ICD-10-CM | POA: Diagnosis not present

## 2015-06-23 DIAGNOSIS — Z87440 Personal history of urinary (tract) infections: Secondary | ICD-10-CM

## 2015-06-23 DIAGNOSIS — I6529 Occlusion and stenosis of unspecified carotid artery: Secondary | ICD-10-CM | POA: Diagnosis not present

## 2015-06-23 LAB — URINALYSIS, COMPLETE
BILIRUBIN UA: NEGATIVE
GLUCOSE, UA: NEGATIVE
KETONES UA: NEGATIVE
Nitrite, UA: POSITIVE — AB
SPEC GRAV UA: 1.02 (ref 1.005–1.030)
Urobilinogen, Ur: 0.2 mg/dL (ref 0.2–1.0)
pH, UA: 5 (ref 5.0–7.5)

## 2015-06-23 LAB — MICROSCOPIC EXAMINATION
RBC, UA: NONE SEEN /hpf (ref 0–?)
WBC, UA: >30 /hpf — ABNORMAL HIGH (ref 0–?)

## 2015-06-23 LAB — BLADDER SCAN AMB NON-IMAGING

## 2015-06-23 NOTE — Progress Notes (Signed)
06/23/2015 5:14 PM   [name] 1929/05/03 [id number]  Referring provider: Einar Pheasant, MD [id number] [place], [place] [address]  Chief Complaint  Patient presents with  . Nephrolithiasis    1year    HPI: 82 year old female seen 1 year ago for multiple medical problems who presents today for routine follow-up.  History of kidney stones Known left 14 x 4 mm left lower pole nonobstructing calculus, atrophic kidney x many years. She elected observation for this stone.  Most recent imaging of the kidney with a renal ultrasound 05/28/2015 which showed a 5 mm upper pole stone as well as a 5 mm lower pole stone, atrophic kidney with scarring and increase echogenicity without hydronephrosis.  No flank pain or gross hematuria.    Recurrent urinary tract infections Previously on daily Macrobid since stopped. Remote history of pyelonephritis 30 years ago. Has never tried her used estrogen cream.  Since last year, she was treated once over New Years with fluconazole for "UTI".  UA today suspicious for UTI but she does report that this was likely contaminated.  No dysuria, hematuria, or fevers.    Urge incontinence Baseline urinary urgency, frequency, nocturia 3 and urinary urge incontinence. She is previously tried imipramine 10 mg, Mybetriq 50 mg, Toviaz, tolteradine and oxybutynin. She was previously on tolterodine 4 mg which worked fairly well but insurance did not cover this. She has gone back to taking oxybutynin 5 mg twice a day. No stress urinary incontinence status post procedure by Dr. Ernst Spell at the time of hysterectomy.   PVR today low.   She is interested in coming off of this medicine, she is concerned that it may be causing her to have dry mouth and increased mucus in there throat.    Pelvic organ prolapse Used pessary in the past but not currently. Not bothered by prolapse.  PMH: Past Medical History  Diagnosis Date  . GERD  (gastroesophageal reflux disease)   . Hypothyroidism   . Hypercholesterolemia   . Urinary incontinence   . Osteoporosis     s/p fosamax, boniva, reclast, forteo.  bilateral spontaneous femur fractures  . Recurrent cystitis   . Varicose veins     chronic venous insufficiency  . Degenerative arthritis     lumbar spine.  spondylolisthesis, hip and leg pain, scoliosis  . Carotid artery occlusion   . Nausea   . Shortness of breath dyspnea   . History of hiatal hernia   . Hypertension     Surgical History: Past Surgical History  Procedure Laterality Date  . Tonsillectomy and adenoidectomy  1950  . Varicose vein ligation  1972  . Uterus suspension and sterilization  1963  . Breast reduction surgery  1987  . Hysterectomy and bladder repair  1998  . Varicose vein sclerotherapy      Dr Hulda Humphrey  . L5-l6 fusion  2/07  . Trigger finger repair      right fourth    Home Medications:    Medication List       This list is accurate as of: 06/23/15  5:14 PM.  Always use your most recent med list.               aspirin EC 81 MG tablet  Take by mouth.     CALCIUM CITRATE + D 250-200 MG-UNIT Tabs  Generic drug:  Calcium Citrate-Vitamin D  Take by mouth.     Coenzyme Q10 100 MG capsule  Take 1 or 2 times  daily as needed     fluticasone 50 MCG/ACT nasal spray  Commonly known as:  FLONASE  Place into the nose.     levothyroxine 75 MCG tablet  Commonly known as:  SYNTHROID, LEVOTHROID  TAKE 1 TABLET EVERY DAY     Melatonin 3 MG Tabs  Take by mouth.     MULTI-VITAMINS Tabs  Take by mouth.     omeprazole 20 MG capsule  Commonly known as:  PRILOSEC  TAKE 1 CAPSULE EVERY DAY     oxybutynin 5 MG tablet  Commonly known as:  DITROPAN  Take 1 tablet (5 mg total) by mouth 2 (two) times daily.     pravastatin 20 MG tablet  Commonly known as:  PRAVACHOL  Take 1 tablet (20 mg total) by mouth daily.     RA ACETAMINOPHEN 650 MG CR tablet  Generic drug:  acetaminophen  Take by  mouth.     VITAMIN D-1000 MAX ST 1000 units tablet  Generic drug:  Cholecalciferol  Take by mouth.        Allergies:  Allergies  Allergen Reactions  . Thimerosal Rash    Family History: Family History  Problem Relation Age of Onset  . Breast cancer Daughter   . Rheumatic fever Mother     died age 34  . Heart disease      father's family  . Diabetes      father's family  . Heart attack Father     Social History:  reports that she has never smoked. She has never used smokeless tobacco. She reports that she drinks alcohol. She reports that she does not use illicit drugs.  ROS: UROLOGY Frequent Urination?: Yes Hard to postpone urination?: Yes Burning/pain with urination?: No Get up at night to urinate?: Yes Leakage of urine?: Yes Urine stream starts and stops?: No Trouble starting stream?: No Do you have to strain to urinate?: No Blood in urine?: No Urinary tract infection?: No Sexually transmitted disease?: No Injury to kidneys or bladder?: No Painful intercourse?: No Weak stream?: No Currently pregnant?: No Vaginal bleeding?: No Last menstrual period?: n  Gastrointestinal Nausea?: No Vomiting?: No Diarrhea?: No Constipation?: No  Constitutional Fever: No Night sweats?: No Weight loss?: No Fatigue?: No  Skin Skin rash/lesions?: No Itching?: No  Eyes Blurred vision?: No Double vision?: No  Ears/Nose/Throat Sore throat?: No Sinus problems?: No  Hematologic/Lymphatic Swollen glands?: No Easy bruising?: No  Cardiovascular Leg swelling?: No Chest pain?: No  Respiratory Cough?: Yes Shortness of breath?: Yes  Endocrine Excessive thirst?: No  Musculoskeletal Back pain?: No Joint pain?: Yes  Neurological Headaches?: No Dizziness?: No  Psychologic Depression?: No Anxiety?: No  Physical Exam: BP 139/73 mmHg  Pulse 79  Ht 4\' 11"  (1.499 m)  Wt 138 lb (62.596 kg)  BMI 27.86 kg/m2  Constitutional:  Alert and oriented, No acute  distress. HEENT: [place] AT, moist mucus membranes.  Trachea midline, no masses. Cardiovascular: No clubbing, cyanosis, or edema. Respiratory: Normal respiratory effort, no increased work of breathing. Skin: No rashes, bruises or suspicious lesions. Neurologic: Grossly intact, no focal deficits, moving all 4 extremities. Psychiatric: Normal mood and affect.  Laboratory Data: Lab Results  Component Value Date   WBC 5.8 05/27/2015   HGB 15.3* 05/27/2015   HCT 45.6 05/27/2015   MCV 93.9 05/27/2015   PLT 219.0 05/27/2015    Lab Results  Component Value Date   CREATININE 1.03 05/27/2015    Urinalysis Results for orders placed or performed in visit  on 06/23/15  Microscopic Examination  Result Value Ref Range   WBC, UA >30 (H) 0 -  5 /hpf   RBC, UA None seen 0 -  2 /hpf   Epithelial Cells (non renal) >10 (H) 0 - 10 /hpf   Bacteria, UA Many (A) None seen/Few   Yeast, UA Present (A) None seen  Urinalysis, Complete  Result Value Ref Range   Specific Gravity, UA 1.020 1.005 - 1.030   pH, UA 5.0 5.0 - 7.5   Color, UA Yellow Yellow   Appearance Ur Cloudy (A) Clear   Leukocytes, UA 2+ (A) Negative   Protein, UA Trace (A) Negative/Trace   Glucose, UA Negative Negative   Ketones, UA Negative Negative   RBC, UA 1+ (A) Negative   Bilirubin, UA Negative Negative   Urobilinogen, Ur 0.2 0.2 - 1.0 mg/dL   Nitrite, UA Positive (A) Negative   Microscopic Examination See below:   BLADDER SCAN AMB NON-IMAGING  Result Value Ref Range   Scan Result 48ml      Pertinent Imaging:  Study Result     CLINICAL DATA: Evaluate kidney stones  EXAM: RENAL / URINARY TRACT ULTRASOUND COMPLETE  COMPARISON: 10/15/2013  FINDINGS: Right Kidney:  Length: 10.9 cm. Echogenicity within normal limits. No mass or hydronephrosis visualized.  Left Kidney:  Length: 6.4 cm. Upper pole calculus measures 5 mm. Lower pole calculus measures 5 mm. Scarring is identified Echogenicity within normal  limits. No mass or hydronephrosis visualized.  Bladder:  Appears normal for degree of bladder distention.  IMPRESSION: 1. Left renal atrophy and scarring. 2. Nonobstructing left renal calculi.   Electronically Signed  By: Kerby Moors M.D.  On: 05/28/2015 15:58   Renal ultrasound personally reviewed  Assessment & Plan:    1. Nephrolithiasis Stable left atrophic kidney with asymptomatic nonobstructing stones Intervention previously discussed in the past, prefers conservative management with observation Remains asymptomatic - Urinalysis, Complete - DG Abd 1 View; Future  2. Atrophy of left kidney As above, stable atrophy of left kidney  3. Urge incontinence of urine Currently on oxybutynin 5 mg twice daily. Post void residual minimal today Discussed side effects, previously failed multiple other anticholinergic medications and beta 3 agonist  We discussed stopping this medication for a few weeks to see if it is truly helping her symptoms and to assess whether her mucus/dry mouth is truly caused by this medication. She may or may not continue with the medication thereafter pending the outcome of this trial.  - BLADDER SCAN AMB NON-IMAGING - CULTURE, URINE COMPREHENSIVE  4. OAB (overactive bladder) As above  5. History of recurrent UTIs UA today suspicious for UTI, asymptomatic.  She does report improper collection. We will send for urine culture to rule out pathogen. We'll defer treatment if mixed flora or contaminated.   Return in about 1 year (around 06/22/2016) for PVR, KUB.  Hollice Espy, MD  [facility] [address] [place], [place] [address] [phone]

## 2015-06-25 LAB — CULTURE, URINE COMPREHENSIVE

## 2015-08-10 ENCOUNTER — Other Ambulatory Visit: Payer: Self-pay | Admitting: Internal Medicine

## 2015-08-20 DIAGNOSIS — G5603 Carpal tunnel syndrome, bilateral upper limbs: Secondary | ICD-10-CM | POA: Diagnosis not present

## 2015-08-29 ENCOUNTER — Other Ambulatory Visit: Payer: Self-pay | Admitting: Urology

## 2015-08-29 DIAGNOSIS — N3281 Overactive bladder: Secondary | ICD-10-CM

## 2015-09-01 DIAGNOSIS — G5603 Carpal tunnel syndrome, bilateral upper limbs: Secondary | ICD-10-CM | POA: Diagnosis not present

## 2015-09-02 ENCOUNTER — Other Ambulatory Visit: Payer: Self-pay | Admitting: Specialist

## 2015-09-09 ENCOUNTER — Ambulatory Visit: Payer: Medicare Other | Admitting: Internal Medicine

## 2015-09-10 ENCOUNTER — Ambulatory Visit (INDEPENDENT_AMBULATORY_CARE_PROVIDER_SITE_OTHER): Payer: Medicare Other | Admitting: Internal Medicine

## 2015-09-10 ENCOUNTER — Telehealth: Payer: Self-pay | Admitting: Internal Medicine

## 2015-09-10 ENCOUNTER — Encounter: Payer: Self-pay | Admitting: Internal Medicine

## 2015-09-10 VITALS — BP 112/60 | HR 79 | Temp 97.7°F | Resp 17 | Ht 59.0 in | Wt 136.8 lb

## 2015-09-10 DIAGNOSIS — R0602 Shortness of breath: Secondary | ICD-10-CM | POA: Diagnosis not present

## 2015-09-10 DIAGNOSIS — Z01818 Encounter for other preprocedural examination: Secondary | ICD-10-CM

## 2015-09-10 DIAGNOSIS — I10 Essential (primary) hypertension: Secondary | ICD-10-CM

## 2015-09-10 DIAGNOSIS — E7800 Pure hypercholesterolemia, unspecified: Secondary | ICD-10-CM | POA: Diagnosis not present

## 2015-09-10 DIAGNOSIS — I701 Atherosclerosis of renal artery: Secondary | ICD-10-CM | POA: Diagnosis not present

## 2015-09-10 DIAGNOSIS — E039 Hypothyroidism, unspecified: Secondary | ICD-10-CM

## 2015-09-10 DIAGNOSIS — R1011 Right upper quadrant pain: Secondary | ICD-10-CM

## 2015-09-10 DIAGNOSIS — I739 Peripheral vascular disease, unspecified: Secondary | ICD-10-CM

## 2015-09-10 DIAGNOSIS — K219 Gastro-esophageal reflux disease without esophagitis: Secondary | ICD-10-CM | POA: Diagnosis not present

## 2015-09-10 DIAGNOSIS — I779 Disorder of arteries and arterioles, unspecified: Secondary | ICD-10-CM

## 2015-09-10 LAB — HEPATIC FUNCTION PANEL
ALT: 14 U/L (ref 0–35)
AST: 20 U/L (ref 0–37)
Albumin: 4.1 g/dL (ref 3.5–5.2)
Alkaline Phosphatase: 48 U/L (ref 39–117)
BILIRUBIN DIRECT: 0.1 mg/dL (ref 0.0–0.3)
TOTAL PROTEIN: 7.2 g/dL (ref 6.0–8.3)
Total Bilirubin: 0.7 mg/dL (ref 0.2–1.2)

## 2015-09-10 LAB — CBC WITH DIFFERENTIAL/PLATELET
BASOS PCT: 0.6 % (ref 0.0–3.0)
Basophils Absolute: 0 10*3/uL (ref 0.0–0.1)
EOS ABS: 0.2 10*3/uL (ref 0.0–0.7)
Eosinophils Relative: 2.9 % (ref 0.0–5.0)
HEMATOCRIT: 46.3 % — AB (ref 36.0–46.0)
Hemoglobin: 15.5 g/dL — ABNORMAL HIGH (ref 12.0–15.0)
LYMPHS PCT: 19.8 % (ref 12.0–46.0)
Lymphs Abs: 1.5 10*3/uL (ref 0.7–4.0)
MCHC: 33.5 g/dL (ref 30.0–36.0)
MCV: 94.1 fl (ref 78.0–100.0)
MONO ABS: 0.6 10*3/uL (ref 0.1–1.0)
Monocytes Relative: 8.2 % (ref 3.0–12.0)
NEUTROS ABS: 5 10*3/uL (ref 1.4–7.7)
Neutrophils Relative %: 68.5 % (ref 43.0–77.0)
Platelets: 231 10*3/uL (ref 150.0–400.0)
RBC: 4.92 Mil/uL (ref 3.87–5.11)
RDW: 14.3 % (ref 11.5–15.5)
WBC: 7.3 10*3/uL (ref 4.0–10.5)

## 2015-09-10 LAB — BASIC METABOLIC PANEL
BUN: 25 mg/dL — AB (ref 6–23)
CHLORIDE: 106 meq/L (ref 96–112)
CO2: 30 mEq/L (ref 19–32)
Calcium: 10.3 mg/dL (ref 8.4–10.5)
Creatinine, Ser: 1.06 mg/dL (ref 0.40–1.20)
GFR: 52.52 mL/min — ABNORMAL LOW (ref >60.00)
Glucose, Bld: 89 mg/dL (ref 70–99)
POTASSIUM: 4.3 meq/L (ref 3.5–5.1)
SODIUM: 141 meq/L (ref 135–145)

## 2015-09-10 NOTE — Telephone Encounter (Signed)
Pt states she was told she needs a medical clearance appt with Dr Fletcher Anon. Who will sh appt?  Call pt @ [phone]. Thank you!

## 2015-09-10 NOTE — Progress Notes (Signed)
Pre-visit discussion using our clinic review tool. No additional management support is needed unless otherwise documented below in the visit note.  

## 2015-09-10 NOTE — Progress Notes (Signed)
Patient ID: Julia Patterson, female   DOB: [birth year], 82 y.o.   MRN: [id number]   Subjective:    Patient ID: Julia Patterson, female    DOB: Aug 18, 1929, 82 y.o.   MRN: [id number]  HPI  Patient here for a scheduled follow up.  She reports still noticing sob with exertion.  Has seen pulmonary.  W/up as outlined in last note and their note.  She is planning to have left carpal tunnel surgery.  Needs pre op clearance. No chest pain.  Does report the persistent increased sob with exertion.  Tries to stay as active as she can.  No acid reflux.  Intermittent constipation and diarrhea.  Discussed taking a probiotic.  Has noticed some intermittent RUQ pain.  Only intermittent.  Desires no further w/up.     Past Medical History:  Diagnosis Date  . Carotid artery occlusion   . Degenerative arthritis    lumbar spine.  spondylolisthesis, hip and leg pain, scoliosis  . GERD (gastroesophageal reflux disease)   . History of hiatal hernia   . Hypercholesterolemia   . Hypertension   . Hypothyroidism   . Nausea   . Osteoporosis    s/p fosamax, boniva, reclast, forteo.  bilateral spontaneous femur fractures  . Recurrent cystitis   . Shortness of breath dyspnea   . Urinary incontinence   . Varicose veins    chronic venous insufficiency   Past Surgical History:  Procedure Laterality Date  . BREAST REDUCTION SURGERY  1987  . hysterectomy and bladder repair  1998  . L5-L6 fusion  2/07  . TONSILLECTOMY AND ADENOIDECTOMY  1950  . trigger finger repair     right fourth  . uterus suspension and sterilization  1963  . varicose vein ligation  1972  . varicose vein sclerotherapy     Dr Hulda Humphrey   Family History  Problem Relation Age of Onset  . Rheumatic fever Mother     died age 38  . Heart attack Father   . Breast cancer Daughter   . Heart disease      father's family  . Diabetes      father's family   Social History   Social History  . Marital status: Divorced    Spouse name: N/A   . Number of children: N/A  . Years of education: N/A   Social History Main Topics  . Smoking status: Never Smoker  . Smokeless tobacco: Never Used  . Alcohol use 0.0 oz/week     Comment: rarely  . Drug use: No  . Sexual activity: Not Asked   Other Topics Concern  . None   Social History Narrative  . None    Outpatient Encounter Prescriptions as of 09/10/2015  Medication Sig  . aspirin EC 81 MG tablet Take by mouth.  . Calcium Citrate-Vitamin D (CALCIUM CITRATE + D) 250-200 MG-UNIT TABS Take by mouth.  . Cholecalciferol (VITAMIN D-1000 MAX ST) 1000 UNITS tablet Take by mouth.  . Coenzyme Q10 100 MG capsule Take 1 or 2 times daily as needed  . fluticasone (FLONASE) 50 MCG/ACT nasal spray Place into the nose.  . levothyroxine (SYNTHROID, LEVOTHROID) 75 MCG tablet TAKE 1 TABLET EVERY DAY  . Multiple Vitamin (MULTI-VITAMINS) TABS Take by mouth.  Marland Kitchen omeprazole (PRILOSEC) 20 MG capsule TAKE 1 CAPSULE EVERY DAY  . oxybutynin (DITROPAN) 5 MG tablet TAKE 1 TABLET TWICE DAILY (Patient taking differently: TAKE 1 TABLET ONCE DAILY)  . pravastatin (PRAVACHOL) 20 MG tablet TAKE 1 TABLET  EVERY DAY  . [DISCONTINUED] acetaminophen (RA ACETAMINOPHEN) 650 MG CR tablet Take by mouth.  . [DISCONTINUED] Melatonin 3 MG TABS Take by mouth.  . Melatonin 3 MG TABS Take 3 mg by mouth daily.   No facility-administered encounter medications on file as of 09/10/2015.     Review of Systems  Constitutional: Negative for appetite change and unexpected weight change.  HENT: Negative for congestion and sinus pressure.   Respiratory: Positive for shortness of breath. Negative for cough and chest tightness.   Cardiovascular: Negative for chest pain, palpitations and leg swelling.  Gastrointestinal: Positive for abdominal pain (intermittent as outlined. ). Negative for nausea.       Bowels alternate between diarrhea and constipation.   Genitourinary: Negative for difficulty urinating and dysuria.    Musculoskeletal: Negative for joint swelling and myalgias.  Skin: Negative for color change and rash.  Neurological: Negative for dizziness, light-headedness and headaches.  Psychiatric/Behavioral: Negative for agitation and dysphoric mood.       Objective:     Left arm 98-100/72, right arm- 122/70  Physical Exam  Constitutional: She appears well-developed and well-nourished.  HENT:  Nose: Nose normal.  Mouth/Throat: Oropharynx is clear and moist.  Neck: Neck supple. No thyromegaly present.  Cardiovascular: Normal rate and regular rhythm.   Pulmonary/Chest: Breath sounds normal. No respiratory distress. She has no wheezes.  Abdominal: Soft. Bowel sounds are normal.  Minimal tenderness - RUQ.    Musculoskeletal: She exhibits no edema or tenderness.  Lymphadenopathy:    She has no cervical adenopathy.  Skin: No rash noted. No erythema.  Psychiatric: She has a normal mood and affect. Her behavior is normal.    BP 112/60 (BP Location: Right Arm, Patient Position: Sitting, Cuff Size: Normal)   Pulse 79   Temp 97.7 F (36.5 C) (Oral)   Resp 17   Ht 4\' 11"  (1.499 m)   Wt 136 lb 12 oz (62 kg)   SpO2 95%   BMI 27.62 kg/m  Wt Readings from Last 3 Encounters:  09/10/15 136 lb 12 oz (62 kg)  06/23/15 138 lb (62.6 kg)  04/29/15 138 lb 12 oz (62.9 kg)     Lab Results  Component Value Date   WBC 7.3 09/10/2015   HGB 15.5 (H) 09/10/2015   HCT 46.3 (H) 09/10/2015   PLT 231.0 09/10/2015   GLUCOSE 89 09/10/2015   CHOL 163 05/27/2015   TRIG 123.0 05/27/2015   HDL 44.00 05/27/2015   LDLCALC 94 05/27/2015   ALT 14 09/10/2015   AST 20 09/10/2015   NA 141 09/10/2015   K 4.3 09/10/2015   CL 106 09/10/2015   CREATININE 1.06 09/10/2015   BUN 25 (H) 09/10/2015   CO2 30 09/10/2015   TSH 2.08 05/27/2015    US Renal  Result Date: 05/28/2015 CLINICAL DATA:  Evaluate kidney stones EXAM: RENAL / URINARY TRACT ULTRASOUND COMPLETE COMPARISON:  10/15/2013 FINDINGS: Right Kidney:  Length: 10.9 cm. Echogenicity within normal limits. No mass or hydronephrosis visualized. Left Kidney: Length: 6.4 cm. Upper pole calculus measures 5 mm. Lower pole calculus measures 5 mm. Scarring is identified Echogenicity within normal limits. No mass or hydronephrosis visualized. Bladder: Appears normal for degree of bladder distention. IMPRESSION: 1. Left renal atrophy and scarring. 2. Nonobstructing left renal calculi. Electronically Signed   By: Kerby Moors M.D.   On: 05/28/2015 15:58       Assessment & Plan:   Problem List Items Addressed This Visit    Abdominal pain, right  upper quadrant    Desires no further w/up.       Carotid artery disease ([place])    Followed by Dr Lucky Cowboy.  Continue risk factor modification.       Essential hypertension, benign    Blood pressure has been controlled.  Pressures do vary  - left/right arm.  See note.  Stable.  Continue same medication regimen.  Will need close intra op and post op monitoring of her heart rate and blood pressure to avoid extremes.        GERD (gastroesophageal reflux disease)    Controlled on omeprazole.       Hypercholesterolemia    On pravastatin.  She has eaten today.  Check liver panel.  Hold on checking cholesterol.        Relevant Orders   Hepatic function panel (Completed)   Hypothyroidism    On thyroid replacement.  Follow tsh.        Pre-op evaluation    Planning to have carpal tunnel surgery 09/21/15.  Having the increased sob with exertion as outlined.   EKG as outlined.  Given her symptoms and EKG, will have cardiology evaluate for cardiac clearance/recommendations.  Will need close intra op and post op monitoring of her heart rate and blood pressures to avoid extremes.        Renal artery stenosis ([facility])    Followed by [facility].       SOB (shortness of breath)    Previous CT scan as outlined in overview. Has a massive hernia.  Felt not to be a surgical candidate.  Has previously seen pulmonary.  PFTs as  outlined.  Has previously seen cardiology.  Given increased sob with exertion, EKG obtained and revealed SR with QRS widening and TW changes as outlined.  Will have cardiology reevaluate prior to surgery for cardiac clearance and recommendations.        Relevant Orders   CBC with Differential/Platelet (Completed)   Basic metabolic panel (Completed)    Other Visit Diagnoses    Preop examination    -  Primary   Relevant Orders   EKG 12-Lead (Completed)       Einar Pheasant, MD

## 2015-09-10 NOTE — Telephone Encounter (Signed)
Notify patient that she needs to call and schedule it, thanks

## 2015-09-10 NOTE — Telephone Encounter (Signed)
Pt was called and she verbalized understanding. Thank you!

## 2015-09-11 ENCOUNTER — Encounter: Payer: Self-pay | Admitting: *Deleted

## 2015-09-11 ENCOUNTER — Encounter: Payer: Self-pay | Admitting: Internal Medicine

## 2015-09-11 DIAGNOSIS — Z01818 Encounter for other preprocedural examination: Secondary | ICD-10-CM | POA: Insufficient documentation

## 2015-09-11 NOTE — Assessment & Plan Note (Signed)
On thyroid replacement.  Follow tsh.  

## 2015-09-11 NOTE — Assessment & Plan Note (Signed)
Followed by Dr Dew.  Continue risk factor modification.  

## 2015-09-11 NOTE — Assessment & Plan Note (Signed)
On pravastatin.  She has eaten today.  Check liver panel.  Hold on checking cholesterol.

## 2015-09-11 NOTE — Assessment & Plan Note (Signed)
Previous CT scan as outlined in overview. Has a massive hernia.  Felt not to be a surgical candidate.  Has previously seen pulmonary.  PFTs as outlined.  Has previously seen cardiology.  Given increased sob with exertion, EKG obtained and revealed SR with QRS widening and TW changes as outlined.  Will have cardiology reevaluate prior to surgery for cardiac clearance and recommendations.

## 2015-09-11 NOTE — Assessment & Plan Note (Signed)
Controlled on omeprazole.   

## 2015-09-11 NOTE — Assessment & Plan Note (Signed)
Desires no further w/up.

## 2015-09-11 NOTE — Assessment & Plan Note (Signed)
Planning to have carpal tunnel surgery 09/21/15.  Having the increased sob with exertion as outlined.   EKG as outlined.  Given her symptoms and EKG, will have cardiology evaluate for cardiac clearance/recommendations.  Will need close intra op and post op monitoring of her heart rate and blood pressures to avoid extremes.

## 2015-09-11 NOTE — Assessment & Plan Note (Signed)
Blood pressure has been controlled.  Pressures do vary  - left/right arm.  See note.  Stable.  Continue same medication regimen.  Will need close intra op and post op monitoring of her heart rate and blood pressure to avoid extremes.

## 2015-09-11 NOTE — Assessment & Plan Note (Signed)
Followed by [facility].   

## 2015-09-14 ENCOUNTER — Encounter
Admission: RE | Admit: 2015-09-14 | Discharge: 2015-09-14 | Disposition: A | Discharge status: Home / Self Care | Payer: Medicare Other | Source: Ambulatory Visit | Attending: Specialist | Admitting: Specialist

## 2015-09-14 ENCOUNTER — Other Ambulatory Visit: Payer: Medicare Other

## 2015-09-14 DIAGNOSIS — Z01818 Encounter for other preprocedural examination: Secondary | ICD-10-CM | POA: Insufficient documentation

## 2015-09-14 NOTE — Pre-Procedure Instructions (Signed)
Patient has appt 09-15-2015 with Dr Fletcher Anon for cardiac clearance.

## 2015-09-14 NOTE — Patient Instructions (Signed)
Your procedure is scheduled on: 09-21-2015 Su procedimiento est programado para: Report to [facility]. Presntese a: To find out your arrival time please call [phone] between 1PM - 3PM on 09-18-2015 Para saber su hora de llegada por favor llame al [phone] entre la 1PM - 3PM el da:  Remember: Instructions that are not followed completely may result in serious medical risk, up to and including death, or upon the discretion of your surgeon and anesthesiologist your surgery may need to be rescheduled.  Recuerde: Las instrucciones que no se siguen completamente Heritage manager en un riesgo de salud grave, incluyendo hasta la [place] o a discrecin de su cirujano y Environmental health practitioner, su ciruga se puede posponer.   __X__ 1. Do not eat food or drink liquids after midnight. No gum chewing or hard candies.  No coma alimentos ni tome lquidos despus de la medianoche.  No mastique chicle ni caramelos  duros.     __X__ 2. No alcohol for 24 hours before or after surgery.    No tome alcohol durante las 24 horas antes ni despus de la [place].   _X___ 3. Bring all medications with you on the day of surgery if instructed.    Lleve todos los medicamentos con usted el da de su ciruga si se le ha indicado as.   __X__ 4. Notify your doctor if there is any change in your medical condition (cold, fever,                             infections).    Informe a su mdico si hay algn cambio en su condicin mdica (resfriado, fiebre, infecciones).   Do not wear jewelry, make-up, hairpins, clips or nail polish.  No use joyas, maquillajes, pinzas/ganchos para el cabello ni esmalte de uas.  Do not wear lotions, powders, or perfumes. You may wear deodorant.  No use lociones, polvos o perfumes.  Puede usar desodorante.    Do not shave 48 hours prior to surgery. Men may shave face and neck.  No se afeite 48 horas antes de la [place].  Los hombres pueden [employer] cara y el cuello.   Do not  bring valuables to the hospital.   No lleve objetos [facility] is not responsible for any belongings or valuables.  [facility] no se hace responsable de ningn tipo de pertenencias u objetos de Geographical information systems officer.               Contacts, dentures or bridgework may not be worn into surgery.  Los lentes de [place], las dentaduras postizas o puentes no se pueden usar en la [place].  Leave your suitcase in the car. After surgery it may be brought to your room.  Deje su maleta en el auto.  Despus de la ciruga podr traerla a su habitacin.  For patients admitted to the hospital, discharge time is determined by your treatment team.  Para los pacientes que sean ingresados al hospital, el tiempo en el cual se le dar de alta es determinado por su                equipo de [place].   Patients discharged the day of surgery will not be allowed to drive home. A los pacientes que se les da de alta el mismo da de la ciruga no se les permitir conducir a Holiday representative.   Please read over the following fact sheets that  you were given: Por favor lea las siguientes hojas de informacin que le dieron:   Surgical Site Infection Prevention   ____ Take these medicines the morning of surgery with A SIP OF WATER:          [employer] medicinas la maana de la ciruga con UN SORBO DE AGUA:  1. LEVOTHYROXINE  2. OMEPRAZOLE THE NIGHT BEFORE AND DAY OF SURGERY  3. FLONASE  4. OXYBUTYNIN       5. ACIDOPHILUS  6.  ____ Fleet Enema (as directed)          Enema de Fleet (segn lo indicado)    __X__ Use CHG Soap as directed          Utilice el jabn de CHG segn lo indicado  ____ Use inhalers on the day of surgery          Use los inhaladores el da de la ciruga  ____ Stop metformin 2 days prior to surgery          Deje de tomar el metformin 2 das antes de la ciruga    ____ Take 1/2 of usual insulin dose the night before surgery and none on the morning of surgery           Tome la mitad de la  dosis habitual de insulina la noche antes de la [place] y no tome nada en la maana de la             ciruga  __X__ Stop Coumadin/Plavix/ ASPIRIN on Sep 17, 2015,           ___X_ Stop Anti-inflammatories - MELOXICAM IBUPROFEN, ADVIL AND MOTRIN, AND ALEVE   September 17, 2015 USE TYLENOL ONLY       __X__ Stop supplements until after surgery  September 17, 2015 CO10, MELATONIN, MULTIPLE VITAMIN          Deje de tomar suplementos hasta despus de la ciruga  ____ Bring C-Pap to the hospital          [place] al hospital

## 2015-09-15 ENCOUNTER — Encounter: Payer: Self-pay | Admitting: Cardiovascular Disease

## 2015-09-15 ENCOUNTER — Ambulatory Visit (INDEPENDENT_AMBULATORY_CARE_PROVIDER_SITE_OTHER): Payer: Medicare Other | Admitting: Cardiovascular Disease

## 2015-09-15 VITALS — BP 134/70 | HR 94 | Ht 59.0 in | Wt 139.2 lb

## 2015-09-15 DIAGNOSIS — R011 Cardiac murmur, unspecified: Secondary | ICD-10-CM

## 2015-09-15 DIAGNOSIS — I701 Atherosclerosis of renal artery: Secondary | ICD-10-CM | POA: Diagnosis not present

## 2015-09-15 DIAGNOSIS — R0602 Shortness of breath: Secondary | ICD-10-CM

## 2015-09-15 DIAGNOSIS — I10 Essential (primary) hypertension: Secondary | ICD-10-CM

## 2015-09-15 DIAGNOSIS — Z01810 Encounter for preprocedural cardiovascular examination: Secondary | ICD-10-CM

## 2015-09-15 NOTE — Patient Instructions (Addendum)
Medication Instructions:  Your physician recommends that you continue on your current medications as directed. Please refer to the Current Medication list given to you today.   Labwork: none  Testing/Procedures: Your physician has requested that you have an echocardiogram. Echocardiography is a painless test that uses sound waves to create images of your heart. It provides your doctor with information about the size and shape of your heart and how well your heart's chambers and valves are working. This procedure takes approximately one hour. There are no restrictions for this procedure.    Follow-Up: Your physician recommends that you schedule a follow-up appointment as needed.    Any Other Special Instructions Will Be Listed Below (If Applicable).     If you need a refill on your cardiac medications before your next appointment, please call your pharmacy.  Echocardiogram An echocardiogram, or echocardiography, uses sound waves (ultrasound) to produce an image of your heart. The echocardiogram is simple, painless, obtained within a short period of time, and offers valuable information to your health care provider. The images from an echocardiogram can provide information such as:  Evidence of coronary artery disease (CAD).  Heart size.  Heart muscle function.  Heart valve function.  Aneurysm detection.  Evidence of a past heart attack.  Fluid buildup around the heart.  Heart muscle thickening.  Assess heart valve function. LET YOUR HEALTH CARE PROVIDER KNOW ABOUT:  Any allergies you have.  All medicines you are taking, including vitamins, herbs, eye drops, creams, and over-the-counter medicines.  Previous problems you or members of your family have had with the use of anesthetics.  Any blood disorders you have.  Previous surgeries you have had.  Medical conditions you have.  Possibility of pregnancy, if this applies. BEFORE THE PROCEDURE  No special  preparation is needed. Eat and drink normally.  PROCEDURE   In order to produce an image of your heart, gel will be applied to your chest and a wand-like tool (transducer) will be moved over your chest. The gel will help transmit the sound waves from the transducer. The sound waves will harmlessly bounce off your heart to allow the heart images to be captured in real-time motion. These images will then be recorded.  You may need an IV to receive a medicine that improves the quality of the pictures. AFTER THE PROCEDURE You may return to your normal schedule including diet, activities, and medicines, unless your health care provider tells you otherwise.   This information is not intended to replace advice given to you by your health care provider. Make sure you discuss any questions you have with your health care provider.   Document Released: 01/29/2000 Document Revised: 02/21/2014 Document Reviewed: 10/08/2012 Elsevier Interactive Patient Education 2016 Elsevier Inc.  

## 2015-09-15 NOTE — Progress Notes (Signed)
Cardiology Office Note   Date:  09/15/2015   ID:  Julia Patterson, DOB 08/17/1929, MRN [id number]  PCP:  Einar Pheasant, MD  Cardiologist:   Kathlyn Sacramento, MD   Chief Complaint  Patient presents with  . Other    Cardiac clearance carpal tunnel. Meds reviewed verbally with pt.      History of Present Illness: Julia Patterson is a 82 y.o. female who presents for   Preoperative cardiovascular evaluation before carpal tunnel surgery. She has known history of hyperlipidemia, hypothyroidism, asymptomatic carotid stenosis, renal artery stenosis and left bundle branch block. She was seen by me in late 2015 for an abnormal EKG and exertional dyspnea. She underwent a pharmacologic nuclear stress test which showed no evidence of ischemia. She was found to have a cardiac murmur. Echocardiogram showed normal LV systolic function, mild aortic sclerosis, mild mitral regurgitation and mild-to-moderate tricuspid regurgitation with mild pulmonary hypertension. She needs to have carpal tunnel surgery on the left side. She has been relatively stable from a cardiac standpoint and denies any chest pain. She does complain of significant exertional dyspnea but she is able to perform activities of daily living. She is able to go up one flight of stairs with moderate shortness of breath.  She reports having problems with anesthesia the past causing prolonged hallucinations for weeks after the surgery.    Past Medical History:  Diagnosis Date  . Carotid artery occlusion   . Degenerative arthritis    lumbar spine.  spondylolisthesis, hip and leg pain, scoliosis  . GERD (gastroesophageal reflux disease)   . History of hiatal hernia   . Hypercholesterolemia   . Hypertension   . Hypothyroidism   . Nausea   . Osteoporosis    s/p fosamax, boniva, reclast, forteo.  bilateral spontaneous femur fractures  . Recurrent cystitis   . Shortness of breath dyspnea   . Urinary incontinence   . Varicose  veins    chronic venous insufficiency    Past Surgical History:  Procedure Laterality Date  . ABDOMINAL HYSTERECTOMY    . BREAST REDUCTION SURGERY  1987  . hysterectomy and bladder repair  1998  . L5-L6 fusion  2/07  . TONSILLECTOMY AND ADENOIDECTOMY  1950  . trigger finger repair     right fourth  . uterus suspension and sterilization  1963  . varicose vein ligation  1972  . varicose vein sclerotherapy     Dr Hulda Humphrey     Current Outpatient Prescriptions  Medication Sig Dispense Refill  . acidophilus (RISAQUAD) CAPS capsule Take 1 capsule by mouth daily.    Marland Kitchen aspirin EC 81 MG tablet Take by mouth.    . Calcium Citrate-Vitamin D (CALCIUM CITRATE + D) 250-200 MG-UNIT TABS Take 1 tablet by mouth 2 (two) times daily.     . Cholecalciferol (VITAMIN D-1000 MAX ST) 1000 UNITS tablet Take by mouth.    . Coenzyme Q10 100 MG capsule Take 1 or 2 times daily as needed    . fluticasone (FLONASE) 50 MCG/ACT nasal spray Place 1 spray into both nostrils daily.     Marland Kitchen levothyroxine (SYNTHROID, LEVOTHROID) 75 MCG tablet TAKE 1 TABLET EVERY DAY 90 tablet 3  . Melatonin 3 MG TABS Take 3 mg by mouth daily.    . meloxicam (MOBIC) 7.5 MG tablet Take 7.5 mg by mouth daily.    . Multiple Vitamin (MULTI-VITAMINS) TABS Take by mouth.    Marland Kitchen omeprazole (PRILOSEC) 20 MG capsule TAKE 1 CAPSULE EVERY DAY  90 capsule 3  . oxybutynin (DITROPAN) 5 MG tablet TAKE 1 TABLET TWICE DAILY (Patient taking differently: TAKE 1 TABLET DAILY) 180 tablet 3  . pravastatin (PRAVACHOL) 20 MG tablet TAKE 1 TABLET EVERY DAY 90 tablet 1   No current facility-administered medications for this visit.     Allergies:   Thimerosal    Social History:  The patient  reports that she has never smoked. She has never used smokeless tobacco. She reports that she drinks alcohol. She reports that she does not use drugs.   Family History:  The patient's family history includes Breast cancer in her daughter; Heart attack in her father; Rheumatic  fever in her mother.    ROS:  Please see the history of present illness.   Otherwise, review of systems are positive for none.   All other systems are reviewed and negative.    PHYSICAL EXAM: VS:  BP 134/70 (BP Location: Left Arm, Patient Position: Sitting, Cuff Size: Normal)   Pulse 94   Ht 4\' 11"  (1.499 m)   Wt 139 lb 4 oz (63.2 kg)   BMI 28.13 kg/m  , BMI Body mass index is 28.13 kg/m. GEN: Well nourished, well developed, in no acute distress  HEENT: normal  Neck: no JVD, carotid bruits, or masses Cardiac: RRR; no rubs, or gallops,no edema . 2 out of 6 mid peaking systolic murmur in the aortic area and left sternal border.  Respiratory:  clear to auscultation bilaterally, normal work of breathing GI: soft, nontender, nondistended, + BS MS: no deformity or atrophy  Skin: warm and dry, no rash Neuro:  Strength and sensation are intact Psych: euthymic mood, full affect   EKG:  EKG is not ordered today. Recent EKG was reviewed which showed sinus rhythm with left bundle branch block. This is not different from prior ECG  Recent Labs: 05/27/2015: TSH 2.08 09/10/2015: ALT 14; BUN 25; Creatinine, Ser 1.06; Hemoglobin 15.5; Platelets 231.0; Potassium 4.3; Sodium 141    Lipid Panel    Component Value Date/Time   CHOL 163 05/27/2015 0929   TRIG 123.0 05/27/2015 0929   HDL 44.00 05/27/2015 0929   CHOLHDL 4 05/27/2015 0929   VLDL 24.6 05/27/2015 0929   LDLCALC 94 05/27/2015 0929      Wt Readings from Last 3 Encounters:  09/15/15 139 lb 4 oz (63.2 kg)  09/14/15 136 lb (61.7 kg)  09/10/15 136 lb 12 oz (62 kg)        ASSESSMENT AND PLAN:  1.  Preoperative cardiovascular evaluation for carpal tunnel surgery: The patient had a stress test done in January 2016 which showed no evidence of ischemia. She is known to have left bundle branch block which is not a new finding. Previous echocardiogram showed mild to moderate valvular abnormalities. Her functional capacity is reduced  but overall is reasonable. Main symptoms include significant shortness of breath. Thus, I requested an echocardiogram especially that her cardiac murmur appears to be more prominent than before. If echocardiogram does not show significant change to the one in 2016, she can proceed with surgery at an overall moderate risk considering her age and abnormal EKG. No indication for stress testing. The patient reports problems with anesthesia in the past causing prolonged hallucinations and she prefers to have local anesthesia.  2. Left bundle branch block: This is not a new finding.  3. Asymptomatic carotid disease: Followed by [facility].   4. Essential hypertension: Blood pressure is reasonably controlled.     Disposition:  FU with me prn.  Signed,  Kathlyn Sacramento, MD  09/15/2015 2:01 PM    [facility]

## 2015-09-16 ENCOUNTER — Telehealth: Payer: Self-pay | Admitting: Cardiovascular Disease

## 2015-09-16 NOTE — Telephone Encounter (Signed)
L MOM with Surgery scheduler at [place] to fax surgical clearance request.

## 2015-09-17 NOTE — Pre-Procedure Instructions (Signed)
CLEARED MODERATE RISK BY ARIDA 09/15/15

## 2015-09-18 ENCOUNTER — Telehealth: Payer: Self-pay | Admitting: Cardiovascular Disease

## 2015-09-18 ENCOUNTER — Ambulatory Visit (INDEPENDENT_AMBULATORY_CARE_PROVIDER_SITE_OTHER): Payer: Medicare Other

## 2015-09-18 ENCOUNTER — Other Ambulatory Visit: Payer: Self-pay

## 2015-09-18 DIAGNOSIS — R011 Cardiac murmur, unspecified: Secondary | ICD-10-CM | POA: Diagnosis not present

## 2015-09-18 DIAGNOSIS — R0602 Shortness of breath: Secondary | ICD-10-CM

## 2015-09-18 NOTE — Telephone Encounter (Signed)
Left detailed message on pt home VM regarding cardiac clearance. Faxed clearance for left carpal tunnel surgery on 09/21/15 to [facility], [phone]

## 2015-09-21 ENCOUNTER — Encounter: Payer: Self-pay | Admitting: *Deleted

## 2015-09-21 ENCOUNTER — Ambulatory Visit
Admission: RE | Admit: 2015-09-21 | Discharge: 2015-09-21 | Disposition: A | Discharge status: Home / Self Care | Payer: Medicare Other | Source: Ambulatory Visit | Attending: Specialist | Admitting: Specialist

## 2015-09-21 ENCOUNTER — Ambulatory Visit: Payer: Medicare Other | Admitting: Anesthesiology

## 2015-09-21 ENCOUNTER — Encounter
Admission: RE | Disposition: A | Discharge status: Home / Self Care | Payer: Self-pay | Source: Ambulatory Visit | Attending: Specialist

## 2015-09-21 DIAGNOSIS — M1990 Unspecified osteoarthritis, unspecified site: Secondary | ICD-10-CM | POA: Insufficient documentation

## 2015-09-21 DIAGNOSIS — E039 Hypothyroidism, unspecified: Secondary | ICD-10-CM | POA: Insufficient documentation

## 2015-09-21 DIAGNOSIS — I739 Peripheral vascular disease, unspecified: Secondary | ICD-10-CM | POA: Diagnosis not present

## 2015-09-21 DIAGNOSIS — G5602 Carpal tunnel syndrome, left upper limb: Secondary | ICD-10-CM | POA: Diagnosis not present

## 2015-09-21 DIAGNOSIS — K219 Gastro-esophageal reflux disease without esophagitis: Secondary | ICD-10-CM | POA: Insufficient documentation

## 2015-09-21 DIAGNOSIS — I10 Essential (primary) hypertension: Secondary | ICD-10-CM | POA: Diagnosis not present

## 2015-09-21 DIAGNOSIS — G5603 Carpal tunnel syndrome, bilateral upper limbs: Secondary | ICD-10-CM | POA: Diagnosis not present

## 2015-09-21 HISTORY — PX: CARPAL TUNNEL RELEASE: SHX101

## 2015-09-21 SURGERY — CARPAL TUNNEL RELEASE
Anesthesia: General | Site: Hand | Laterality: Left | Wound class: Clean

## 2015-09-21 MED ORDER — MELOXICAM 7.5 MG PO TABS
ORAL_TABLET | ORAL | Status: AC
  Filled 2015-09-21: qty 2

## 2015-09-21 MED ORDER — PHENYLEPHRINE HCL 10 MG/ML IJ SOLN
INTRAMUSCULAR | Status: DC | PRN
  Administered 2015-09-21 (×4): 100 ug via INTRAVENOUS

## 2015-09-21 MED ORDER — ONDANSETRON HCL 4 MG/2ML IJ SOLN
INTRAMUSCULAR | Status: DC | PRN
  Administered 2015-09-21: 4 mg via INTRAVENOUS

## 2015-09-21 MED ORDER — CEFAZOLIN SODIUM-DEXTROSE 2-4 GM/100ML-% IV SOLN
INTRAVENOUS | Status: AC
  Filled 2015-09-21: qty 100

## 2015-09-21 MED ORDER — LIDOCAINE HCL (CARDIAC) 20 MG/ML IV SOLN
INTRAVENOUS | Status: DC | PRN
  Administered 2015-09-21: 60 mg via INTRAVENOUS

## 2015-09-21 MED ORDER — MELOXICAM 7.5 MG PO TABS
15.0000 mg | ORAL_TABLET | Freq: Once | ORAL | Status: AC
  Administered 2015-09-21: 15 mg via ORAL

## 2015-09-21 MED ORDER — HYDROCODONE-ACETAMINOPHEN 5-325 MG PO TABS: 1.0000 | ORAL_TABLET | Freq: Four times a day (QID) | ORAL | 0 refills | Status: DC | PRN

## 2015-09-21 MED ORDER — CHLORHEXIDINE GLUCONATE CLOTH 2 % EX PADS: 6.0000 | MEDICATED_PAD | Freq: Once | CUTANEOUS | Status: DC

## 2015-09-21 MED ORDER — GABAPENTIN 400 MG PO CAPS: 400.0000 mg | ORAL_CAPSULE | Freq: Three times a day (TID) | ORAL | 3 refills | Status: DC

## 2015-09-21 MED ORDER — PROPOFOL 10 MG/ML IV BOLUS
INTRAVENOUS | Status: DC | PRN
  Administered 2015-09-21: 60 mg via INTRAVENOUS
  Administered 2015-09-21: 30 mg via INTRAVENOUS

## 2015-09-21 MED ORDER — MELOXICAM 7.5 MG PO TABS
ORAL_TABLET | ORAL | Status: AC
  Administered 2015-09-21: 15 mg via ORAL
  Filled 2015-09-21: qty 1

## 2015-09-21 MED ORDER — ESMOLOL HCL 100 MG/10ML IV SOLN
INTRAVENOUS | Status: DC | PRN
  Administered 2015-09-21: 30 mg via INTRAVENOUS

## 2015-09-21 MED ORDER — GABAPENTIN 300 MG PO CAPS
300.0000 mg | ORAL_CAPSULE | ORAL | Status: AC
  Administered 2015-09-21: 300 mg via ORAL

## 2015-09-21 MED ORDER — BUPIVACAINE HCL (PF) 0.5 % IJ SOLN
INTRAMUSCULAR | Status: AC
  Filled 2015-09-21: qty 30

## 2015-09-21 MED ORDER — CEFAZOLIN SODIUM-DEXTROSE 2-4 GM/100ML-% IV SOLN
2.0000 g | INTRAVENOUS | Status: AC
  Administered 2015-09-21: 2 g via INTRAVENOUS

## 2015-09-21 MED ORDER — LACTATED RINGERS IV SOLN
INTRAVENOUS | Status: DC
  Administered 2015-09-21: 12:00:00 via INTRAVENOUS

## 2015-09-21 MED ORDER — FENTANYL CITRATE (PF) 100 MCG/2ML IJ SOLN
INTRAMUSCULAR | Status: DC | PRN
  Administered 2015-09-21: 25 ug via INTRAVENOUS
  Administered 2015-09-21: 50 ug via INTRAVENOUS

## 2015-09-21 MED ORDER — GABAPENTIN 300 MG PO CAPS
ORAL_CAPSULE | ORAL | Status: AC
  Administered 2015-09-21: 300 mg via ORAL
  Filled 2015-09-21: qty 1

## 2015-09-21 MED ORDER — BUPIVACAINE HCL 0.5 % IJ SOLN
INTRAMUSCULAR | Status: DC | PRN
  Administered 2015-09-21: 16 mL

## 2015-09-21 MED ORDER — EPHEDRINE SULFATE 50 MG/ML IJ SOLN
INTRAMUSCULAR | Status: DC | PRN
  Administered 2015-09-21: 10 mg via INTRAVENOUS

## 2015-09-21 SURGICAL SUPPLY — 23 items
BLADE SURG MINI STRL (BLADE) ×3 IMPLANT
BNDG ESMARK 4X12 TAN STRL LF (GAUZE/BANDAGES/DRESSINGS) ×3 IMPLANT
CANISTER SUCT 1200ML W/VALVE (MISCELLANEOUS) ×3 IMPLANT
CHLORAPREP W/TINT 26ML (MISCELLANEOUS) ×3 IMPLANT
CUFF TOURN 18 STER (MISCELLANEOUS) IMPLANT
ELECT REM PT RETURN 9FT ADLT (ELECTROSURGICAL) ×3
ELECTRODE REM PT RTRN 9FT ADLT (ELECTROSURGICAL) ×1 IMPLANT
GAUZE FLUFF 18X24 1PLY STRL (GAUZE/BANDAGES/DRESSINGS) ×3 IMPLANT
GAUZE PETRO XEROFOAM 1X8 (MISCELLANEOUS) ×3 IMPLANT
GLOVE BIO SURGEON STRL SZ8 (GLOVE) ×3 IMPLANT
GOWN STRL REUS W/ TWL LRG LVL3 (GOWN DISPOSABLE) ×2 IMPLANT
GOWN STRL REUS W/TWL LRG LVL3 (GOWN DISPOSABLE) ×4
KIT RM TURNOVER STRD PROC AR (KITS) ×3 IMPLANT
NS IRRIG 500ML POUR BTL (IV SOLUTION) ×3 IMPLANT
PACK EXTREMITY ARMC (MISCELLANEOUS) ×3 IMPLANT
PAD PREP 24X41 OB/GYN DISP (PERSONAL CARE ITEMS) ×3 IMPLANT
SPLINT CAST 1 STEP 3X12 (MISCELLANEOUS) ×3 IMPLANT
STOCKINETTE BIAS CUT 4 980044 (GAUZE/BANDAGES/DRESSINGS) ×3 IMPLANT
STOCKINETTE STRL 4IN 9604848 (GAUZE/BANDAGES/DRESSINGS) ×3 IMPLANT
SUT ETHILON 4-0 (SUTURE) ×2
SUT ETHILON 4-0 FS2 18XMFL BLK (SUTURE) ×1
SUT ETHILON 5-0 FS-2 18 BLK (SUTURE) ×3 IMPLANT
SUTURE ETHLN 4-0 FS2 18XMF BLK (SUTURE) ×1 IMPLANT

## 2015-09-21 NOTE — OR Nursing (Signed)
Up to bathroom voided large amount.  Bcame weak and light headed and slightly nauseated.  V/s stable bp 111/82.  Encouraged deep breathing O2 Sat up to 97. Back to chair with feet elevated.  Aromatherapy of peppermint oil on cotton ball for inhalation for nausea.

## 2015-09-21 NOTE — Anesthesia Preprocedure Evaluation (Signed)
Anesthesia Evaluation  Patient identified by MRN, date of birth, ID band Patient awake    Reviewed: Allergy & Precautions, H&P , NPO status , Patient's Chart, lab work & pertinent test results  History of Anesthesia Complications (+) Emergence Delirium and history of anesthetic complications (with spine surgery)  Airway Mallampati: III  TM Distance: <3 FB Neck ROM: limited    Dental  (+) Poor Dentition, Chipped   Pulmonary shortness of breath and with exertion,    Pulmonary exam normal breath sounds clear to auscultation       Cardiovascular Exercise Tolerance: Good hypertension, (-) angina+ Peripheral Vascular Disease  Normal cardiovascular exam+ Valvular Problems/Murmurs  Rhythm:regular Rate:Normal     Neuro/Psych  Neuromuscular disease negative psych ROS   GI/Hepatic Neg liver ROS, hiatal hernia, GERD  Medicated and Controlled,  Endo/Other  Hypothyroidism   Renal/GU CRFRenal disease  negative genitourinary   Musculoskeletal  (+) Arthritis ,   Abdominal   Peds  Hematology negative hematology ROS (+)   Anesthesia Other Findings Past Medical History: No date: Carotid artery occlusion No date: Degenerative arthritis     Comment: lumbar spine.  spondylolisthesis, hip and leg               pain, scoliosis No date: GERD (gastroesophageal reflux disease) No date: History of hiatal hernia No date: Hypercholesterolemia No date: Hypertension No date: Hypothyroidism No date: Nausea No date: Osteoporosis     Comment: s/p fosamax, boniva, reclast, forteo.                bilateral spontaneous femur fractures No date: Recurrent cystitis No date: Shortness of breath dyspnea No date: Urinary incontinence No date: Varicose veins     Comment: chronic venous insufficiency  Past Surgical History: No date: ABDOMINAL HYSTERECTOMY 1987: BREAST REDUCTION SURGERY 1998: hysterectomy and bladder repair 2/07: L5-L6  fusion 1950: TONSILLECTOMY AND ADENOIDECTOMY No date: trigger finger repair     Comment: right fourth 1963: uterus suspension and sterilization 1972: varicose vein ligation No date: varicose vein sclerotherapy     Comment: Dr Hulda Humphrey  BMI    Body Mass Index:  28.07 kg/m      Reproductive/Obstetrics negative OB ROS                             Anesthesia Physical Anesthesia Plan  ASA: III  Anesthesia Plan: General LMA   Post-op Pain Management:    Induction:   Airway Management Planned:   Additional Equipment:   Intra-op Plan:   Post-operative Plan:   Informed Consent: I have reviewed the patients History and Physical, chart, labs and discussed the procedure including the risks, benefits and alternatives for the proposed anesthesia with the patient or authorized representative who has indicated his/her understanding and acceptance.   Dental Advisory Given  Plan Discussed with: Anesthesiologist, CRNA and Surgeon  Anesthesia Plan Comments:         Anesthesia Quick Evaluation

## 2015-09-21 NOTE — Anesthesia Postprocedure Evaluation (Signed)
Anesthesia Post Note  Patient: Julia Patterson  Procedure(s) Performed: Procedure(s) (LRB): CARPAL TUNNEL RELEASE (Left)  Patient location during evaluation: PACU Anesthesia Type: General Level of consciousness: awake and alert Pain management: pain level controlled Vital Signs Assessment: post-procedure vital signs reviewed and stable Respiratory status: spontaneous breathing, nonlabored ventilation, respiratory function stable and patient connected to nasal cannula oxygen Cardiovascular status: blood pressure returned to baseline and stable Postop Assessment: no signs of nausea or vomiting Anesthetic complications: no    Last Vitals:  Vitals:   09/21/15 1429 09/21/15 1444  BP: (!) 169/91 (!) 176/78  Pulse: 90 93  Resp: 18 20  Temp:  36.7 C    Last Pain:  Vitals:   09/21/15 1138  TempSrc: Oral                 Precious Haws Piscitello

## 2015-09-21 NOTE — Discharge Instructions (Signed)

## 2015-09-21 NOTE — Op Note (Signed)
09/21/2015  1:45 PM  PATIENT:  Julia Patterson    PRE-OPERATIVE DIAGNOSIS: LEFT CARPAL TUNNEL SYNDROME POST-OPERATIVE DIAGNOSIS: LEFT CARPAL TUNNEL SYNDROME  PROCEDURE:  LEFT CARPAL TUNNEL RELEASE  SURGEON: Park Breed, MD  TOURNIQUET TIME: 21 min    ANESTHESIA:   General  PREOPERATIVE INDICATIONS:  Julia Patterson is a  82 y.o. female with a diagnosis of right carpal tunnel syndrome who failed conservative measures and elected for surgical management.    The risks benefits and alternatives were discussed with the patient preoperatively including but not limited to the risks of infection, bleeding, nerve injury, incomplete relief of symptoms, pillar pain, cardiopulmonary complications, the need for revision surgery, among others, and the patient was willing to proceed.  OPERATIVE FINDINGS: Thickened volar ligament and nerve compression.  OPERATIVE PROCEDURE: The patient is brought to the operating room placed in the supine position. General anesthesia was administered. The left upper extremity was prepped and draped in usual sterile fashion. Time out was performed. The arm was elevated and exsanguinated and the tourniquet was inflated. Incision was made in line with the radial border of the ring finger. The carpal tunnel transverse fascia was identified, cleaned, and incised sharply. The common sensory branches were visualized along with the superficial palmar arch and protected.  The median nerve was protected below. A Kelly clamp was  placed underneath the transverse carpal ligament, protecting the nerve. I released the ligament completely, and then released the proximal distal volar forearm fascia. The nerve was identified, and visualized, and protected throughout the case. The motor branch was intact upon inspection. No masses or abnormalities were identified in the ulnar bursa.  The wounds were irrigated copiously, and the wounds injected, and the skin closed with nylon  followed by a volar splint and sterile gauze. Tourniquet was deflated with good return of blood flow to all fingers. Sponge and needle counts were correct.  The patient tolerated this well, with no complications. The patient was awakened and taken to recovery in good condition.

## 2015-09-21 NOTE — Transfer of Care (Signed)
Immediate Anesthesia Transfer of Care Note  Patient: Julia Patterson  Procedure(s) Performed: Procedure(s): CARPAL TUNNEL RELEASE (Left)  Patient Location: PACU  Anesthesia Type:General  Level of Consciousness: awake  Airway & Oxygen Therapy: Patient Spontanous Breathing  Post-op Assessment: Report given to RN  Post vital signs: stable  Last Vitals:  Vitals:   09/21/15 1138 09/21/15 1357  BP: (!) 182/71 (!) 163/86  Pulse: 80 93  Resp: 18 14  Temp: 36.4 C 36.4 C    Last Pain:  Vitals:   09/21/15 1138  TempSrc: Oral         Complications: No apparent anesthesia complications

## 2015-09-21 NOTE — H&P (Signed)
THE PATIENT WAS SEEN PRIOR TO SURGERY TODAY.  HISTORY, ALLERGIES, HOME MEDICATIONS AND OPERATIVE PROCEDURE WERE REVIEWED. RISKS AND BENEFITS OF SURGERY DISCUSSED WITH PATIENT AGAIN.  NO CHANGES FROM INITIAL HISTORY AND PHYSICAL NOTED.    

## 2015-09-22 ENCOUNTER — Encounter: Payer: Self-pay | Admitting: Specialist

## 2015-09-23 ENCOUNTER — Inpatient Hospital Stay (HOSPITAL_COMMUNITY)
Admit: 2015-09-23 | Discharge: 2015-09-23 | Disposition: A | Discharge status: Home / Self Care | Payer: Medicare Other | Attending: Student | Admitting: Student

## 2015-09-23 ENCOUNTER — Encounter: Payer: Self-pay | Admitting: Emergency Medicine

## 2015-09-23 ENCOUNTER — Inpatient Hospital Stay
Admission: EM | Admit: 2015-09-23 | Discharge: 2015-09-29 | DRG: 246 | Disposition: A | Discharge status: Skilled Nursing Facility | Payer: Medicare Other | Attending: Internal Medicine | Admitting: Internal Medicine

## 2015-09-23 ENCOUNTER — Emergency Department: Payer: Medicare Other

## 2015-09-23 DIAGNOSIS — Z9181 History of falling: Secondary | ICD-10-CM | POA: Diagnosis not present

## 2015-09-23 DIAGNOSIS — E039 Hypothyroidism, unspecified: Secondary | ICD-10-CM | POA: Diagnosis present

## 2015-09-23 DIAGNOSIS — I2510 Atherosclerotic heart disease of native coronary artery without angina pectoris: Secondary | ICD-10-CM | POA: Diagnosis present

## 2015-09-23 DIAGNOSIS — I959 Hypotension, unspecified: Secondary | ICD-10-CM

## 2015-09-23 DIAGNOSIS — M810 Age-related osteoporosis without current pathological fracture: Secondary | ICD-10-CM | POA: Diagnosis present

## 2015-09-23 DIAGNOSIS — K449 Diaphragmatic hernia without obstruction or gangrene: Secondary | ICD-10-CM | POA: Diagnosis present

## 2015-09-23 DIAGNOSIS — R531 Weakness: Secondary | ICD-10-CM | POA: Diagnosis not present

## 2015-09-23 DIAGNOSIS — R110 Nausea: Secondary | ICD-10-CM

## 2015-09-23 DIAGNOSIS — K5900 Constipation, unspecified: Secondary | ICD-10-CM | POA: Diagnosis not present

## 2015-09-23 DIAGNOSIS — I10 Essential (primary) hypertension: Secondary | ICD-10-CM | POA: Diagnosis present

## 2015-09-23 DIAGNOSIS — I951 Orthostatic hypotension: Secondary | ICD-10-CM | POA: Diagnosis not present

## 2015-09-23 DIAGNOSIS — Z8249 Family history of ischemic heart disease and other diseases of the circulatory system: Secondary | ICD-10-CM

## 2015-09-23 DIAGNOSIS — N179 Acute kidney failure, unspecified: Secondary | ICD-10-CM

## 2015-09-23 DIAGNOSIS — I6529 Occlusion and stenosis of unspecified carotid artery: Secondary | ICD-10-CM | POA: Diagnosis present

## 2015-09-23 DIAGNOSIS — E785 Hyperlipidemia, unspecified: Secondary | ICD-10-CM | POA: Diagnosis present

## 2015-09-23 DIAGNOSIS — R197 Diarrhea, unspecified: Secondary | ICD-10-CM | POA: Diagnosis not present

## 2015-09-23 DIAGNOSIS — R918 Other nonspecific abnormal finding of lung field: Secondary | ICD-10-CM | POA: Diagnosis not present

## 2015-09-23 DIAGNOSIS — J9601 Acute respiratory failure with hypoxia: Secondary | ICD-10-CM | POA: Diagnosis not present

## 2015-09-23 DIAGNOSIS — R778 Other specified abnormalities of plasma proteins: Secondary | ICD-10-CM

## 2015-09-23 DIAGNOSIS — I447 Left bundle-branch block, unspecified: Secondary | ICD-10-CM | POA: Diagnosis present

## 2015-09-23 DIAGNOSIS — R6889 Other general symptoms and signs: Secondary | ICD-10-CM | POA: Diagnosis not present

## 2015-09-23 DIAGNOSIS — N390 Urinary tract infection, site not specified: Secondary | ICD-10-CM | POA: Diagnosis present

## 2015-09-23 DIAGNOSIS — E860 Dehydration: Secondary | ICD-10-CM | POA: Diagnosis not present

## 2015-09-23 DIAGNOSIS — Z803 Family history of malignant neoplasm of breast: Secondary | ICD-10-CM

## 2015-09-23 DIAGNOSIS — Z833 Family history of diabetes mellitus: Secondary | ICD-10-CM

## 2015-09-23 DIAGNOSIS — Z791 Long term (current) use of non-steroidal anti-inflammatories (NSAID): Secondary | ICD-10-CM

## 2015-09-23 DIAGNOSIS — Z7982 Long term (current) use of aspirin: Secondary | ICD-10-CM | POA: Diagnosis not present

## 2015-09-23 DIAGNOSIS — R112 Nausea with vomiting, unspecified: Secondary | ICD-10-CM | POA: Diagnosis not present

## 2015-09-23 DIAGNOSIS — R0600 Dyspnea, unspecified: Secondary | ICD-10-CM

## 2015-09-23 DIAGNOSIS — K219 Gastro-esophageal reflux disease without esophagitis: Secondary | ICD-10-CM | POA: Diagnosis present

## 2015-09-23 DIAGNOSIS — M109 Gout, unspecified: Secondary | ICD-10-CM | POA: Diagnosis present

## 2015-09-23 DIAGNOSIS — R55 Syncope and collapse: Secondary | ICD-10-CM | POA: Diagnosis not present

## 2015-09-23 DIAGNOSIS — I214 Non-ST elevation (NSTEMI) myocardial infarction: Secondary | ICD-10-CM | POA: Diagnosis not present

## 2015-09-23 DIAGNOSIS — J9811 Atelectasis: Secondary | ICD-10-CM | POA: Diagnosis present

## 2015-09-23 DIAGNOSIS — Z79899 Other long term (current) drug therapy: Secondary | ICD-10-CM

## 2015-09-23 DIAGNOSIS — M79673 Pain in unspecified foot: Secondary | ICD-10-CM

## 2015-09-23 DIAGNOSIS — R0902 Hypoxemia: Secondary | ICD-10-CM

## 2015-09-23 DIAGNOSIS — Z9989 Dependence on other enabling machines and devices: Secondary | ICD-10-CM | POA: Diagnosis not present

## 2015-09-23 DIAGNOSIS — E7800 Pure hypercholesterolemia, unspecified: Secondary | ICD-10-CM | POA: Diagnosis present

## 2015-09-23 DIAGNOSIS — R7989 Other specified abnormal findings of blood chemistry: Secondary | ICD-10-CM | POA: Diagnosis not present

## 2015-09-23 DIAGNOSIS — M419 Scoliosis, unspecified: Secondary | ICD-10-CM | POA: Diagnosis present

## 2015-09-23 DIAGNOSIS — N170 Acute kidney failure with tubular necrosis: Secondary | ICD-10-CM | POA: Diagnosis not present

## 2015-09-23 DIAGNOSIS — M6281 Muscle weakness (generalized): Secondary | ICD-10-CM | POA: Diagnosis not present

## 2015-09-23 DIAGNOSIS — Z87440 Personal history of urinary (tract) infections: Secondary | ICD-10-CM | POA: Diagnosis not present

## 2015-09-23 DIAGNOSIS — Z9861 Coronary angioplasty status: Secondary | ICD-10-CM | POA: Diagnosis not present

## 2015-09-23 DIAGNOSIS — R262 Difficulty in walking, not elsewhere classified: Secondary | ICD-10-CM | POA: Diagnosis not present

## 2015-09-23 DIAGNOSIS — M19072 Primary osteoarthritis, left ankle and foot: Secondary | ICD-10-CM | POA: Diagnosis not present

## 2015-09-23 HISTORY — DX: Non-ST elevation (NSTEMI) myocardial infarction: I21.4

## 2015-09-23 HISTORY — DX: Atherosclerotic heart disease of native coronary artery without angina pectoris: I25.10

## 2015-09-23 HISTORY — DX: Dyspnea, unspecified: R06.00

## 2015-09-23 HISTORY — DX: Other forms of dyspnea: R06.09

## 2015-09-23 LAB — COMPREHENSIVE METABOLIC PANEL
ALK PHOS: 44 U/L (ref 38–126)
ALT: 12 U/L — AB (ref 14–54)
AST: 30 U/L (ref 15–41)
Albumin: 3.8 g/dL (ref 3.5–5.0)
Anion gap: 6 (ref 5–15)
BILIRUBIN TOTAL: 0.5 mg/dL (ref 0.3–1.2)
BUN: 21 mg/dL — AB (ref 6–20)
CALCIUM: 9.6 mg/dL (ref 8.9–10.3)
CO2: 29 mmol/L (ref 22–32)
CREATININE: 1.18 mg/dL — AB (ref 0.44–1.00)
Chloride: 105 mmol/L (ref 101–111)
GFR calc Af Amer: 48 mL/min — ABNORMAL LOW (ref >60)
GFR, EST NON AFRICAN AMERICAN: 41 mL/min — AB (ref >60)
Glucose, Bld: 100 mg/dL — ABNORMAL HIGH (ref 65–99)
POTASSIUM: 3.8 mmol/L (ref 3.5–5.1)
Sodium: 140 mmol/L (ref 135–145)
TOTAL PROTEIN: 6.5 g/dL (ref 6.5–8.1)

## 2015-09-23 LAB — URINALYSIS COMPLETE WITH MICROSCOPIC (ARMC ONLY)
BILIRUBIN URINE: NEGATIVE
GLUCOSE, UA: NEGATIVE mg/dL
Hgb urine dipstick: NEGATIVE
Ketones, ur: NEGATIVE mg/dL
Nitrite: NEGATIVE
Protein, ur: NEGATIVE mg/dL
Specific Gravity, Urine: 1.047 — ABNORMAL HIGH (ref 1.005–1.030)
pH: 5 (ref 5.0–8.0)

## 2015-09-23 LAB — CBC WITH DIFFERENTIAL/PLATELET
BASOS ABS: 0.1 10*3/uL (ref 0–0.1)
Basophils Relative: 1 %
EOS ABS: 0.1 10*3/uL (ref 0–0.7)
EOS PCT: 2 %
HEMATOCRIT: 40.8 % (ref 35.0–47.0)
HEMOGLOBIN: 14.3 g/dL (ref 12.0–16.0)
LYMPHS PCT: 18 %
Lymphs Abs: 1.5 10*3/uL (ref 1.0–3.6)
MCH: 32.8 pg (ref 26.0–34.0)
MCHC: 35.1 g/dL (ref 32.0–36.0)
MCV: 93.5 fL (ref 80.0–100.0)
MONO ABS: 0.6 10*3/uL (ref 0.2–0.9)
Monocytes Relative: 8 %
Neutro Abs: 6 10*3/uL (ref 1.4–6.5)
Neutrophils Relative %: 71 %
Platelets: 200 10*3/uL (ref 150–440)
RBC: 4.36 MIL/uL (ref 3.80–5.20)
RDW: 13.6 % (ref 11.5–14.5)
WBC: 8.3 10*3/uL (ref 3.6–11.0)

## 2015-09-23 LAB — TROPONIN I
TROPONIN I: 1.71 ng/mL — AB (ref <0.03)
TROPONIN I: 1.99 ng/mL — AB (ref <0.03)
TROPONIN I: 1.87 ng/mL — AB (ref <0.03)

## 2015-09-23 LAB — PROTIME-INR
INR: 1.1
PROTHROMBIN TIME: 14.2 s (ref 11.4–15.2)

## 2015-09-23 LAB — APTT: APTT: 31 s (ref 24–36)

## 2015-09-23 MED ORDER — RISAQUAD PO CAPS
1.0000 | ORAL_CAPSULE | Freq: Every day | ORAL | Status: DC
  Administered 2015-09-24 – 2015-09-29 (×6): 1 via ORAL
  Filled 2015-09-23 (×6): qty 1

## 2015-09-23 MED ORDER — SODIUM CHLORIDE 0.9 % IV SOLN
Freq: Once | INTRAVENOUS | Status: AC
  Administered 2015-09-23: 15:00:00 via INTRAVENOUS

## 2015-09-23 MED ORDER — HEPARIN (PORCINE) IN NACL 100-0.45 UNIT/ML-% IJ SOLN: 12.0000 [IU]/kg/h | Freq: Once | INTRAMUSCULAR | Status: DC

## 2015-09-23 MED ORDER — CALCIUM CITRATE-VITAMIN D 500-400 MG-UNIT PO CHEW
1.0000 | CHEWABLE_TABLET | Freq: Two times a day (BID) | ORAL | Status: DC
  Administered 2015-09-23 – 2015-09-29 (×12): 1 via ORAL
  Filled 2015-09-23 (×12): qty 1

## 2015-09-23 MED ORDER — PRAVASTATIN SODIUM 20 MG PO TABS
20.0000 mg | ORAL_TABLET | Freq: Every day | ORAL | Status: DC
  Administered 2015-09-24: 20 mg via ORAL
  Filled 2015-09-23: qty 1

## 2015-09-23 MED ORDER — ONDANSETRON HCL 4 MG/2ML IJ SOLN
4.0000 mg | Freq: Four times a day (QID) | INTRAMUSCULAR | Status: DC | PRN
  Administered 2015-09-26: 4 mg via INTRAVENOUS
  Filled 2015-09-23: qty 2

## 2015-09-23 MED ORDER — ONDANSETRON HCL 4 MG PO TABS: 4.0000 mg | ORAL_TABLET | Freq: Three times a day (TID) | ORAL | Status: DC | PRN

## 2015-09-23 MED ORDER — SODIUM CHLORIDE 0.9% FLUSH
3.0000 mL | Freq: Two times a day (BID) | INTRAVENOUS | Status: DC
  Administered 2015-09-23 – 2015-09-28 (×7): 3 mL via INTRAVENOUS

## 2015-09-23 MED ORDER — HEPARIN SODIUM (PORCINE) 5000 UNIT/ML IJ SOLN: 60.0000 [IU]/kg | Freq: Once | INTRAMUSCULAR | Status: DC

## 2015-09-23 MED ORDER — HEPARIN BOLUS VIA INFUSION
3350.0000 [IU] | Freq: Once | INTRAVENOUS | Status: AC
  Administered 2015-09-23: 3350 [IU] via INTRAVENOUS
  Filled 2015-09-23: qty 3350

## 2015-09-23 MED ORDER — FLUTICASONE PROPIONATE 50 MCG/ACT NA SUSP
1.0000 | Freq: Every day | NASAL | Status: DC
  Administered 2015-09-25 – 2015-09-28 (×4): 1 via NASAL
  Filled 2015-09-23: qty 16

## 2015-09-23 MED ORDER — PANTOPRAZOLE SODIUM 40 MG PO TBEC
40.0000 mg | DELAYED_RELEASE_TABLET | Freq: Every day | ORAL | Status: DC
  Administered 2015-09-24 – 2015-09-29 (×6): 40 mg via ORAL
  Filled 2015-09-23 (×6): qty 1

## 2015-09-23 MED ORDER — MELOXICAM 7.5 MG PO TABS
7.5000 mg | ORAL_TABLET | Freq: Every day | ORAL | Status: DC
  Administered 2015-09-24: 7.5 mg via ORAL
  Filled 2015-09-23: qty 1

## 2015-09-23 MED ORDER — GABAPENTIN 400 MG PO CAPS
400.0000 mg | ORAL_CAPSULE | Freq: Three times a day (TID) | ORAL | Status: DC
  Administered 2015-09-23 – 2015-09-29 (×16): 400 mg via ORAL
  Filled 2015-09-23 (×16): qty 1

## 2015-09-23 MED ORDER — IOPAMIDOL (ISOVUE-370) INJECTION 76%
60.0000 mL | Freq: Once | INTRAVENOUS | Status: AC | PRN
  Administered 2015-09-23: 60 mL via INTRAVENOUS

## 2015-09-23 MED ORDER — ASPIRIN EC 81 MG PO TBEC
81.0000 mg | DELAYED_RELEASE_TABLET | Freq: Every day | ORAL | Status: DC
  Administered 2015-09-25 – 2015-09-28 (×4): 81 mg via ORAL
  Filled 2015-09-23 (×6): qty 1

## 2015-09-23 MED ORDER — HEPARIN (PORCINE) IN NACL 100-0.45 UNIT/ML-% IJ SOLN
700.0000 [IU]/h | INTRAMUSCULAR | Status: DC
  Administered 2015-09-23: 700 [IU]/h via INTRAVENOUS
  Filled 2015-09-23: qty 250

## 2015-09-23 MED ORDER — MELATONIN 5 MG PO TABS
2.5000 mg | ORAL_TABLET | Freq: Every day | ORAL | Status: DC
Start: 2015-09-23 — End: 2015-09-29
  Administered 2015-09-23 – 2015-09-28 (×4): 2.5 mg via ORAL
  Filled 2015-09-23 (×8): qty 0.5

## 2015-09-23 MED ORDER — ACETAMINOPHEN 650 MG RE SUPP: 650.0000 mg | Freq: Four times a day (QID) | RECTAL | Status: DC | PRN

## 2015-09-23 MED ORDER — LEVOTHYROXINE SODIUM 75 MCG PO TABS
75.0000 ug | ORAL_TABLET | Freq: Every day | ORAL | Status: DC
  Administered 2015-09-25 – 2015-09-29 (×5): 75 ug via ORAL
  Filled 2015-09-23 (×6): qty 1

## 2015-09-23 MED ORDER — ASPIRIN 81 MG PO CHEW
324.0000 mg | CHEWABLE_TABLET | Freq: Once | ORAL | Status: AC
  Administered 2015-09-23: 324 mg via ORAL
  Filled 2015-09-23: qty 4

## 2015-09-23 MED ORDER — OXYBUTYNIN CHLORIDE 5 MG PO TABS
5.0000 mg | ORAL_TABLET | Freq: Every day | ORAL | Status: DC
  Administered 2015-09-24 – 2015-09-29 (×6): 5 mg via ORAL
  Filled 2015-09-23 (×6): qty 1

## 2015-09-23 MED ORDER — SODIUM CHLORIDE 0.9 % IV SOLN
INTRAVENOUS | Status: DC
  Administered 2015-09-23: 22:00:00 via INTRAVENOUS

## 2015-09-23 MED ORDER — SODIUM CHLORIDE 0.9 % IV SOLN
1000.0000 mL | Freq: Once | INTRAVENOUS | Status: AC
  Administered 2015-09-23: 1000 mL via INTRAVENOUS

## 2015-09-23 MED ORDER — ACETAMINOPHEN 325 MG PO TABS
650.0000 mg | ORAL_TABLET | Freq: Four times a day (QID) | ORAL | Status: DC | PRN
  Administered 2015-09-27: 650 mg via ORAL
  Filled 2015-09-23: qty 2

## 2015-09-23 MED ORDER — ONDANSETRON HCL 4 MG PO TABS: 4.0000 mg | ORAL_TABLET | Freq: Four times a day (QID) | ORAL | Status: DC | PRN

## 2015-09-23 NOTE — ED Notes (Signed)
Pt placed on 2L O2 via nasal cannula. SpO2 96%.

## 2015-09-23 NOTE — ED Notes (Signed)
Patient transported to CT 

## 2015-09-23 NOTE — ED Triage Notes (Signed)
Pt arrived via EMS from [facility] for reports of generalized weakness. Pt reports too weak to walk today and she is normally very active. Pt is accompanied by a family friend. Pt reports had carpal tunnel surgery two days ago. EMS reports VSS except slightly tachycardic at 108. EMS reports unremarkable EKG.

## 2015-09-23 NOTE — Consult Note (Signed)
ANTICOAGULATION CONSULT NOTE - Initial Consult  Pharmacy Consult for heparin drip Indication: chest pain/ACS  Allergies  Allergen Reactions  . Thimerosal Rash    Patient Measurements: Height: 4\' 11"  (149.9 cm) Weight: 135 lb (61.2 kg) IBW/kg (Calculated) : 43.2 Heparin Dosing Weight: 56.2kg  Vital Signs: Temp: 99.2 F (37.3 C) (08/09 1216) Temp Source: Oral (08/09 1216) BP: 118/69 (08/09 1500) Pulse Rate: 77 (08/09 1500)  Labs:  Recent Labs  09/23/15 1302  HGB 14.3  HCT 40.8  PLT 200  CREATININE 1.18*  TROPONINI 1.71*    Estimated Creatinine Clearance: 28.7 mL/min (by C-G formula based on SCr of 1.18 mg/dL).   Medical History: Past Medical History:  Diagnosis Date  . Carotid artery occlusion   . Degenerative arthritis    lumbar spine.  spondylolisthesis, hip and leg pain, scoliosis  . GERD (gastroesophageal reflux disease)   . History of hiatal hernia   . Hypercholesterolemia   . Hypertension   . Hypothyroidism   . Nausea   . Osteoporosis    s/p fosamax, boniva, reclast, forteo.  bilateral spontaneous femur fractures  . Recurrent cystitis   . Shortness of breath dyspnea   . Urinary incontinence   . Varicose veins    chronic venous insufficiency    Medications:  Scheduled:  . heparin  3,350 Units Intravenous Once    Assessment: Pt is a 82 year old female who presents with weakness, found to have an elevated troponin. Pharmacy consulted to dose heparin drip. No home anticoagulants found on med rec. Baseline APTT, INR, CBC were ordered.  Goal of Therapy:  Heparin level 0.3-0.7 units/ml Monitor platelets by anticoagulation protocol: Yes   Plan:  Give 3350 units bolus x 1 Start heparin infusion at 700 units/hr Check anti-Xa level in 8 hours and daily while on heparin Continue to monitor H&H and platelets  Elianah Karis D Rosana Farnell, Pharm.D Clinical Pharmacist  09/23/2015,3:31 PM

## 2015-09-23 NOTE — H&P (Signed)
[facility] [place] NAME: Julia Patterson    MR#:  [id number]  DATE OF BIRTH:  06-29-1929  DATE OF ADMISSION:  09/23/2015  PRIMARY CARE PHYSICIAN: Einar Pheasant, MD   REQUESTING/REFERRING PHYSICIAN: Dr. Lenise Arena  CHIEF COMPLAINT:   Chief Complaint  Patient presents with  . Weakness    HISTORY OF PRESENT ILLNESS:  Julia Patterson  is a 82 y.o. female with a known history of Hypertension, hyperlipidemia, hypothyroidism, previous history of non-ST elevation MI, osteoporosis, scoliosis, and urinary incontinence, history of carotid artery disease, GERD who presents to the hospital due to difficulty walking and suddenly noted to have an elevated troponin. Pt. Says that she had carpel Tunnel surgery on Monday and went home and was doing well until later in the day she developed some weakness and difficulty walking. As per the daughter at bedside patient was able to ambulate with some assistance but yesterday it got worse and she was not able to walk and bear weight on her legs. She denies any pain in her extremities or any recent trauma to the back. She had a caretaker who came today who helped get her up but she went to the ground and was not able to ambulate and therefore brought to the ER for further evaluation. In the emergency room patient underwent a CT scan of the head which was essentially benign, she also has a nonfocal neurological exam and a CT chest with contrast shows no evidence of pulmonary embolism. Incidentally she was noted to have a elevated troponin of 1.7. She denies any chest pain. Her EKG shows a left bundle branch block which is unchanged from a previous EKG in the past. Hospitalist services were contacted further treatment and evaluation.  PAST MEDICAL HISTORY:   Past Medical History:  Diagnosis Date  . Carotid artery occlusion   . Degenerative arthritis    lumbar spine.  spondylolisthesis, hip and leg pain,  scoliosis  . GERD (gastroesophageal reflux disease)   . History of hiatal hernia   . Hypercholesterolemia   . Hypertension   . Hypothyroidism   . Nausea   . NSTEMI (non-ST elevated myocardial infarction) ([place]) 09/23/2015  . Osteoporosis    s/p fosamax, boniva, reclast, forteo.  bilateral spontaneous femur fractures  . Recurrent cystitis   . Shortness of breath dyspnea   . Urinary incontinence   . Varicose veins    chronic venous insufficiency    PAST SURGICAL HISTORY:   Past Surgical History:  Procedure Laterality Date  . ABDOMINAL HYSTERECTOMY    . BREAST REDUCTION SURGERY  1987  . CARPAL TUNNEL RELEASE Left 09/21/2015   Procedure: CARPAL TUNNEL RELEASE;  Surgeon: Earnestine Leys, MD;  Location: [facility] ORS;  Service: Orthopedics;  Laterality: Left;  . hysterectomy and bladder repair  1998  . L5-L6 fusion  2/07  . TONSILLECTOMY AND ADENOIDECTOMY  1950  . trigger finger repair     right fourth  . uterus suspension and sterilization  1963  . varicose vein ligation  1972  . varicose vein sclerotherapy     Dr Hulda Humphrey    SOCIAL HISTORY:   Social History  Substance Use Topics  . Smoking status: Never Smoker  . Smokeless tobacco: Never Used  . Alcohol use 0.0 oz/week     Comment: rarely    FAMILY HISTORY:   Family History  Problem Relation Age of Onset  . Rheumatic fever Mother     died age 75  .  Heart attack Father   . Breast cancer Daughter   . Heart disease      father's family  . Diabetes      father's family    DRUG ALLERGIES:   Allergies  Allergen Reactions  . Thimerosal Rash    REVIEW OF SYSTEMS:   Review of Systems  Constitutional: Negative for fever and weight loss.  HENT: Negative for congestion, nosebleeds and tinnitus.   Eyes: Negative for blurred vision, double vision and redness.  Respiratory: Positive for shortness of breath (on exertion). Negative for cough and hemoptysis.   Cardiovascular: Negative for chest pain, orthopnea, leg swelling and  PND.  Gastrointestinal: Negative for abdominal pain, diarrhea, melena, nausea and vomiting.  Genitourinary: Negative for dysuria, hematuria and urgency.  Musculoskeletal: Negative for falls and joint pain.  Neurological: Negative for dizziness, tingling, sensory change, focal weakness, seizures, weakness and headaches.  Endo/Heme/Allergies: Negative for polydipsia. Does not bruise/bleed easily.  Psychiatric/Behavioral: Negative for depression and memory loss. The patient is not nervous/anxious.     MEDICATIONS AT HOME:   Prior to Admission medications   Medication Sig Start Date End Date Taking? Authorizing Provider  acidophilus (RISAQUAD) CAPS capsule Take 1 capsule by mouth daily.   Yes Historical Provider, MD  aspirin EC 81 MG tablet Take by mouth.   Yes Historical Provider, MD  Calcium Citrate-Vitamin D (CALCIUM CITRATE + D) 250-200 MG-UNIT TABS Take 1 tablet by mouth 2 (two) times daily.    Yes Historical Provider, MD  Cholecalciferol (VITAMIN D-1000 MAX ST) 1000 UNITS tablet Take by mouth.   Yes Historical Provider, MD  Coenzyme Q10 100 MG capsule Take 1 or 2 times daily as needed   Yes Historical Provider, MD  fluticasone (FLONASE) 50 MCG/ACT nasal spray Place 1 spray into both nostrils daily.    Yes Historical Provider, MD  gabapentin (NEURONTIN) 400 MG capsule Take 1 capsule (400 mg total) by mouth 3 (three) times daily. 09/21/15  Yes Earnestine Leys, MD  HYDROcodone-acetaminophen (NORCO) 5-325 MG tablet Take 1-2 tablets by mouth every 6 (six) hours as needed. 09/21/15  Yes Earnestine Leys, MD  levothyroxine (SYNTHROID, LEVOTHROID) 75 MCG tablet TAKE 1 TABLET EVERY DAY 04/17/15  Yes Einar Pheasant, MD  Melatonin 3 MG TABS Take 3 mg by mouth daily.   Yes Historical Provider, MD  meloxicam (MOBIC) 7.5 MG tablet Take 7.5 mg by mouth daily.   Yes Historical Provider, MD  Multiple Vitamin (MULTI-VITAMINS) TABS Take by mouth.   Yes Historical Provider, MD  omeprazole (PRILOSEC) 20 MG capsule TAKE 1  CAPSULE EVERY DAY 06/03/15  Yes Einar Pheasant, MD  ondansetron (ZOFRAN) 4 MG tablet Take 4 mg by mouth every 8 (eight) hours as needed for nausea or vomiting.   Yes Historical Provider, MD  oxybutynin (DITROPAN) 5 MG tablet TAKE 1 TABLET TWICE DAILY Patient taking differently: TAKE 1 TABLET DAILY 08/31/15  Yes Shannon A McGowan, PA-C  pravastatin (PRAVACHOL) 20 MG tablet TAKE 1 TABLET EVERY DAY 08/10/15  Yes Einar Pheasant, MD      VITAL SIGNS:  Blood pressure 118/69, pulse 77, temperature 99.2 F (37.3 C), temperature source Oral, resp. rate 14, height 4\' 11"  (1.499 m), weight 61.2 kg (135 lb), SpO2 98 %.  PHYSICAL EXAMINATION:  Physical Exam  GENERAL:  82 y.o.-year-old patient lying in the bed in no acute distress.  EYES: Pupils equal, round, reactive to light and accommodation. No scleral icterus. Extraocular muscles intact.  HEENT: Head atraumatic, normocephalic. Oropharynx and nasopharynx clear. No  oropharyngeal erythema, moist oral mucosa  NECK:  Supple, no jugular venous distention. No thyroid enlargement, no tenderness.  LUNGS: Normal breath sounds bilaterally, no wheezing, rales, rhonchi. No use of accessory muscles of respiration.  CARDIOVASCULAR: S1, S2 RRR. No murmurs, rubs, gallops, clicks.  ABDOMEN: Soft, nontender, nondistended. Bowel sounds present. No organomegaly or mass.  EXTREMITIES: No pedal edema, cyanosis, or clubbing. + 2 pedal & radial pulses b/l.   NEUROLOGIC: Cranial nerves II through XII are intact. No focal Motor or sensory deficits appreciated b/l.  PSYCHIATRIC: The patient is alert and oriented x 3. Good affect.  SKIN: No obvious rash, lesion, or ulcer.   LABORATORY PANEL:   CBC  Recent Labs Lab 09/23/15 1302  WBC 8.3  HGB 14.3  HCT 40.8  PLT 200   ------------------------------------------------------------------------------------------------------------------  Chemistries   Recent Labs Lab 09/23/15 1302  NA 140  K 3.8  CL 105  CO2 29   GLUCOSE 100*  BUN 21*  CREATININE 1.18*  CALCIUM 9.6  AST 30  ALT 12*  ALKPHOS 44  BILITOT 0.5   ------------------------------------------------------------------------------------------------------------------  Cardiac Enzymes  Recent Labs Lab 09/23/15 1302  TROPONINI 1.71*   ------------------------------------------------------------------------------------------------------------------  RADIOLOGY:  Dg Chest 1 View  Result Date: 09/23/2015 CLINICAL DATA:  Inability to walk. EXAM: CHEST 1 VIEW COMPARISON:  Chest CT 06/24/2014 FINDINGS: Cardiomegaly with heart size accentuated by rotation. Chronic large herniation of stomach and fat at the hiatus. Bibasilar streaky opacity correlating with scar atelectasis on the previous chest CT. There is no edema, consolidation, effusion, or pneumothorax. IMPRESSION: 1. No acute finding. 2. Large hiatal hernia with neighboring atelectasis or scar. Electronically Signed   By: Monte Fantasia M.D.   On: 09/23/2015 13:31   Ct Head Wo Contrast  Result Date: 09/23/2015 CLINICAL DATA:  82 year old female with generalize weakness. Initial encounter. EXAM: CT HEAD WITHOUT CONTRAST TECHNIQUE: Contiguous axial images were obtained from the base of the skull through the vertex without intravenous contrast. COMPARISON:  02/25/2006 CT. FINDINGS: No intracranial hemorrhage. No CT evidence of large acute infarct. Partial volume averaging with ventricle (series 2, image 16). No intracranial mass lesion noted on this unenhanced exam. Mild atrophy without hydrocephalus. Visualized mastoid air cells, middle ear cavities and paranasal sinuses are clear. No acute orbital abnormality. Aspects score: 10 IMPRESSION: No acute intracranial abnormality. Electronically Signed   By: Genia Del M.D.   On: 09/23/2015 12:50   Ct Angio Chest Pe W And/or Wo Contrast  Result Date: 09/23/2015 CLINICAL DATA:  Dyspnea, hypoxia, and elevated troponin. EXAM: CT ANGIOGRAPHY CHEST WITH  CONTRAST TECHNIQUE: Multidetector CT imaging of the chest was performed using the standard protocol during bolus administration of intravenous contrast. Multiplanar CT image reconstructions and MIPs were obtained to evaluate the vascular anatomy. CONTRAST:  60 cc Isovue 370 COMPARISON:  Chest x-ray dated 09/23/2015 and CT scan dated 06/24/2014 FINDINGS: Mediastinum/Lymph Nodes: Calcification in the thoracic aorta and in the coronary arteries and mitral valve annulus. Overall heart size is normal. RV LV ratio is normal. Lungs/Pleura: Slight chronic atelectasis at the lung bases due to huge hiatal hernia. Focal bronchiectasis at both lung bases, unchanged. Lungs are otherwise clear. No pulmonary emboli. Upper abdomen: Large hiatal hernia. Aortic atherosclerosis with stenoses of the origins of the celiac and superior mesenteric arteries. Musculoskeletal: Thoracic scoliosis and accentuated kyphosis. No acute abnormalities. Review of the MIP images confirms the above findings. IMPRESSION: No pulmonary emboli. Aortic atherosclerosis. Huge chronic hiatal hernia with chronic atelectasis and bronchiectasis at the lung bases.  Electronically Signed   By: Lorriane Shire M.D.   On: 09/23/2015 15:05     IMPRESSION AND PLAN:   82 year old female with past medical history of hypertension, hyperlipidemia, previous history of nonexertional MI, osteoporosis, scoliosis, history of GERD, carotid artery stenosis, urinary incontinence who presents to the hospital due to difficulty ambulating and incidentally noted to have an elevated troponin.  1. Non-ST elevation MI-this is a suspected diagnosis given the patient's elevated troponin. -EKG shows left bundle branch block which is unchanged from her previous EKG. She denies any chest pain. She does complain of some exertional shortness of breath which could be an anginal equivalent. -Continue aspirin, heparin nomogram, Pravachol.   -Get cardiology consult, follow serum cardiac  markers. Keep on telemetry. Patient may need a cardiac Catheterization.  2. Difficulty walking-etiology and clear. CT head is negative for any acute pathology. Patient has a very nonfocal neurological exam. -I will get a physical therapy evaluation to assess mobility. If persists consider neurological evaluation.  3. History of urinary incontinence-continue oxybutynin.  4. Hyper lipidemia-continue Pravachol.  5. GERD-continue Protonix.  6. Hypothyroidism-continue Synthroid.    All the records are reviewed and case discussed with ED provider. Management plans discussed with the patient, family and they are in agreement.  CODE STATUS: Full  TOTAL TIME TAKING CARE OF THIS PATIENT: 45 minutes.    Henreitta Leber M.D on 09/23/2015 at 3:54 PM  Between 7am to 6pm - Pager - [phone]  After 6pm go to [email or web address] - password [email or web address] [facility] [facility]  Office  [phone]  CC: Primary care physician; Einar Pheasant, MD

## 2015-09-23 NOTE — ED Notes (Signed)
Dr. Jimmye Norman notified of troponin 1.71.

## 2015-09-23 NOTE — Consult Note (Signed)
Cardiology Consult    Patient ID: MACLE CRAM MRN: [id number], DOB/AGE: 08/08/1929   Admit date: 09/23/2015 Date of Consult: 09/23/2015  Primary Physician: Einar Pheasant, MD Reason for Consult: Elevated Troponin Primary Cardiologist: Dr. Fletcher Anon Requesting Provider: Dr. Jimmye Norman   History of Present Illness    Julia Patterson is a 82 y.o. female with past medical history of HLD, carotid artery stenosis, renal artery stenosis and known LBBB who presented to [facility] on 09/23/2015 for evaluation of generalized weakness.  She was recently seen in the office by Dr. Fletcher Anon on 09/15/2015 in relation to preoperative clearance for upcoming carpal tunnel surgery. She reported having baseline dyspnea with exertion, but being able to climb one flight of stairs with moderate dyspnea. Denied any chest discomfort. An echocardiogram was obtained which showed an EF of 55% to 60% with no regional wall motion abnormalities. Grade 1 DD, mild AS, mild to moderate MS, mild MR, an PA peak pressure of 45 mm Hg (S). No known history of CAD. Last ischemic evaluation was a NST in 02/2014 which was low-risk and showed no evidence of ischemia.  She underwent left carpal tunnel release on 09/21/2015 and with no immediate surgical complications. She reports being under generalized anesthesia for the procedure and felt significant nausea following the procedure.  Since going home, she has continued to have some nausea. Was given an Rx for Zofran with improvement in her symptoms. She reports having lower extremity weakness since the procedure. Feels like her legs are going to give out from underneath her, granted she denies any sensory issues and has preserved strength. She actually fell yesterday afternoon while transferring due to "feeling like her legs were not there".   Throughout this, she denies any chest discomfort or worsening dyspnea with exertion. She has baseline dyspnea when climbing the 18 stairs at her  ALF, but denies any acute worsening of these symptoms.  While in the ED, labs have shown a WBC of 8.3, Hgb 14.3, and platelets 200. Electrolytes WNL. Creatinine 1.18. Initial troponin 1.71. EKG shows NSR, HR 87, with LBBB. No acute changes when compared to previous tracing. CXR shows no acute findings but a large hiatal hernia with neighboring atelectasis or scar. CTA with no evidence of a PE. Aortic atherosclerosis and a huge chronic hiatal hernia with chronic atelectasis and bronchiectasis at the lung bases are noted. CT Head with noacute intracranial abnormalities.   Past Medical History   Past Medical History:  Diagnosis Date  . Carotid artery occlusion   . Degenerative arthritis    lumbar spine.  spondylolisthesis, hip and leg pain, scoliosis  . GERD (gastroesophageal reflux disease)   . History of hiatal hernia   . Hypercholesterolemia   . Hypertension   . Hypothyroidism   . Nausea   . Osteoporosis    s/p fosamax, boniva, reclast, forteo.  bilateral spontaneous femur fractures  . Recurrent cystitis   . Shortness of breath dyspnea   . Urinary incontinence   . Varicose veins    chronic venous insufficiency    Past Surgical History:  Procedure Laterality Date  . ABDOMINAL HYSTERECTOMY    . BREAST REDUCTION SURGERY  1987  . CARPAL TUNNEL RELEASE Left 09/21/2015   Procedure: CARPAL TUNNEL RELEASE;  Surgeon: Earnestine Leys, MD;  Location: [facility] ORS;  Service: Orthopedics;  Laterality: Left;  . hysterectomy and bladder repair  1998  . L5-L6 fusion  2/07  . TONSILLECTOMY AND ADENOIDECTOMY  1950  . trigger finger repair  right fourth  . uterus suspension and sterilization  1963  . varicose vein ligation  1972  . varicose vein sclerotherapy     Dr Hulda Humphrey     Allergies  Allergies  Allergen Reactions  . Thimerosal Rash    Inpatient Medications    . heparin  12 Units/kg/hr Intravenous Once  . heparin  60 Units/kg Intravenous Once    Family History    Family History    Problem Relation Age of Onset  . Rheumatic fever Mother     died age 37  . Heart attack Father   . Breast cancer Daughter   . Heart disease      father's family  . Diabetes      father's family    Social History    Social History   Social History  . Marital status: Divorced    Spouse name: N/A  . Number of children: N/A  . Years of education: N/A   Occupational History  . Not on file.   Social History Main Topics  . Smoking status: Never Smoker  . Smokeless tobacco: Never Used  . Alcohol use 0.0 oz/week     Comment: rarely  . Drug use: No  . Sexual activity: Not on file   Other Topics Concern  . Not on file   Social History Narrative  . No narrative on file     Review of Systems    General:  No chills, fever, night sweats or weight changes. Positive for generalized weakness and recent fall. Cardiovascular:  No chest pain, dyspnea on exertion, edema, orthopnea, palpitations, paroxysmal nocturnal dyspnea. Dermatological: No rash, lesions/masses Respiratory: No cough, dyspnea Urologic: No hematuria, dysuria Abdominal:   No vomiting, diarrhea, bright red blood per rectum, melena, or hematemesis. Positive for nausea.  Neurologic:  No visual changes, changes in mental status. All other systems reviewed and are otherwise negative except as noted above.  Physical Exam    Blood pressure 118/69, pulse 77, temperature 99.2 F (37.3 C), temperature source Oral, resp. rate 14, height 4\' 11"  (1.499 m), weight 135 lb (61.2 kg), SpO2 98 %.  General: Pleasant, elderly Caucasian female appearing in NAD. Psych: Normal affect. Neuro: Alert and oriented X 3. Moves all extremities spontaneously. HEENT: Normal  Neck: Supple without bruits or JVD. Lungs:  Resp regular and unlabored, CTA without wheezing or rales. Heart: RRR no s3, s4, or murmurs. Abdomen: Soft, non-tender, non-distended, BS + x 4.  Extremities: No clubbing, cyanosis or edema. DP/PT/Radials 2+ and equal  bilaterally. Strength 5/5 in lower extremities bilaterally. Left forearm in cast.  Labs    Troponin (Point of Care Test) No results for input(s): TROPIPOC in the last 72 hours.  Recent Labs  09/23/15 1302  TROPONINI 1.71*   Lab Results  Component Value Date   WBC 8.3 09/23/2015   HGB 14.3 09/23/2015   HCT 40.8 09/23/2015   MCV 93.5 09/23/2015   PLT 200 09/23/2015    Recent Labs Lab 09/23/15 1302  NA 140  K 3.8  CL 105  CO2 29  BUN 21*  CREATININE 1.18*  CALCIUM 9.6  PROT 6.5  BILITOT 0.5  ALKPHOS 44  ALT 12*  AST 30  GLUCOSE 100*   Lab Results  Component Value Date   CHOL 163 05/27/2015   HDL 44.00 05/27/2015   LDLCALC 94 05/27/2015   TRIG 123.0 05/27/2015   No results found for: [facility]   Radiology Studies    Dg Chest 1 View  Result Date: 09/23/2015 CLINICAL DATA:  Inability to walk. EXAM: CHEST 1 VIEW COMPARISON:  Chest CT 06/24/2014 FINDINGS: Cardiomegaly with heart size accentuated by rotation. Chronic large herniation of stomach and fat at the hiatus. Bibasilar streaky opacity correlating with scar atelectasis on the previous chest CT. There is no edema, consolidation, effusion, or pneumothorax. IMPRESSION: 1. No acute finding. 2. Large hiatal hernia with neighboring atelectasis or scar. Electronically Signed   By: Monte Fantasia M.D.   On: 09/23/2015 13:31   Ct Head Wo Contrast  Result Date: 09/23/2015 CLINICAL DATA:  82 year old female with generalize weakness. Initial encounter. EXAM: CT HEAD WITHOUT CONTRAST TECHNIQUE: Contiguous axial images were obtained from the base of the skull through the vertex without intravenous contrast. COMPARISON:  02/25/2006 CT. FINDINGS: No intracranial hemorrhage. No CT evidence of large acute infarct. Partial volume averaging with ventricle (series 2, image 16). No intracranial mass lesion noted on this unenhanced exam. Mild atrophy without hydrocephalus. Visualized mastoid air cells, middle ear cavities and paranasal  sinuses are clear. No acute orbital abnormality. Aspects score: 10 IMPRESSION: No acute intracranial abnormality. Electronically Signed   By: Genia Del M.D.   On: 09/23/2015 12:50   Ct Angio Chest Pe W And/or Wo Contrast  Result Date: 09/23/2015 CLINICAL DATA:  Dyspnea, hypoxia, and elevated troponin. EXAM: CT ANGIOGRAPHY CHEST WITH CONTRAST TECHNIQUE: Multidetector CT imaging of the chest was performed using the standard protocol during bolus administration of intravenous contrast. Multiplanar CT image reconstructions and MIPs were obtained to evaluate the vascular anatomy. CONTRAST:  60 cc Isovue 370 COMPARISON:  Chest x-ray dated 09/23/2015 and CT scan dated 06/24/2014 FINDINGS: Mediastinum/Lymph Nodes: Calcification in the thoracic aorta and in the coronary arteries and mitral valve annulus. Overall heart size is normal. RV LV ratio is normal. Lungs/Pleura: Slight chronic atelectasis at the lung bases due to huge hiatal hernia. Focal bronchiectasis at both lung bases, unchanged. Lungs are otherwise clear. No pulmonary emboli. Upper abdomen: Large hiatal hernia. Aortic atherosclerosis with stenoses of the origins of the celiac and superior mesenteric arteries. Musculoskeletal: Thoracic scoliosis and accentuated kyphosis. No acute abnormalities. Review of the MIP images confirms the above findings. IMPRESSION: No pulmonary emboli. Aortic atherosclerosis. Huge chronic hiatal hernia with chronic atelectasis and bronchiectasis at the lung bases. Electronically Signed   By: Lorriane Shire M.D.   On: 09/23/2015 15:05    EKG & Cardiac Imaging    EKG: NSR, HR 87, with LBBB. No acute changes when compared to previous tracing.  Echocardiogram: 09/18/2015 Study Conclusions  - Left ventricle: The cavity size was normal. There was mild   concentric hypertrophy. Systolic function was normal. The   estimated ejection fraction was in the range of 55% to 60%. Wall   motion was normal; there were no regional  wall motion   abnormalities. Doppler parameters are consistent with abnormal   left ventricular relaxation (grade 1 diastolic dysfunction). - Aortic valve: There was mild stenosis. Mean gradient (S): 8 mm   Hg. Valve area (VTI): 1.58 cm^2. - Mitral valve: Calcified, moderately thickened annulus. The   findings are consistent with mild to moderate stenosis. There was   mild regurgitation. Mean gradient (D): 7 mm Hg. Valve area by   pressure half-time: 2.44 cm^2. Valve area by continuity equation   (using LVOT flow): 1.26 cm^2. - Left atrium: The atrium was mildly dilated. - Pulmonary arteries: Systolic pressure was mildly to moderately   increased. PA peak pressure: 45 mm Hg (S). - Pericardium, extracardiac: A  trivial pericardial effusion was   identified posterior to the heart.  Assessment & Plan    1. NSTEMI - had carpal tunnel release performed under general anesthesia on 8/7, has experienced significant nausea and lower extremity weakness since. Has baseline dyspnea with exertion and denies any acute worsening of her symptoms. Denies any chest discomfort or palpitations. No known history of CAD. Low-risk NST in 02/2014. - initial troponin elevated to 1.71. Would cycle enzymes. EKG shows known LBBB with no acute ischemic changes noted when compared to previous tracings. CTA with no evidence of PE, large hiatal hernia noted.  - had echo last week which showed EF of 55% to 60% with no regional wall motion abnormalities. Dicussed with Dr. Yvone Neu, will obtain repeat limited echo to assess EF and wall motion. Will likely need a cardiac catheterization tomorrow if troponin values continue to trend upwards or remain significantly elevated (will tentatively write on the board for tomorrow and make NPO after midnight). - started on Heparin while in the ED (Dr. Yvone Neu asked ED provider to verify with Orthopedics this is OK with her recent surgery).  - continue Heparin along with ASA and statin  therapy.  2. HLD - LDL at 94 in 05/2015. Continue statin therapy. If significant CAD noted on potential cath this admission, would adjust current dosing with goal LDL < 70.  3. Carotid Artery Disease - followed by Vascular as an outpatient.  4. Generalized Weakness - of bilateral lower extremities. Had a fall the day prior to admission. Sensation, strength, and pulses in tact on exam. - per admitting team.   Signed, Erma Heritage, PA-C 09/23/2015, 3:23 PM Pager: [phone]

## 2015-09-23 NOTE — ED Provider Notes (Signed)
[facility] Emergency Department Provider Note        Time seen: ----------------------------------------- 12:07 PM on 09/23/2015 -----------------------------------------    I have reviewed the triage vital signs and the nursing notes.   HISTORY  Chief Complaint No chief complaint on file.    HPI Julia Patterson is a 82 y.o. female who presents being brought to the ER by ambulance for weakness. Reportedly she is postoperative from a left upper extremity surgery. Since that time she's had problems with persistent nausea and decreased by mouth intake. Patient reports she feels weak, she denies pain at this time. Caregiver reports she's had weakness below the waist and difficulty walking   Past Medical History:  Diagnosis Date  . Carotid artery occlusion   . Degenerative arthritis    lumbar spine.  spondylolisthesis, hip and leg pain, scoliosis  . GERD (gastroesophageal reflux disease)   . History of hiatal hernia   . Hypercholesterolemia   . Hypertension   . Hypothyroidism   . Nausea   . Osteoporosis    s/p fosamax, boniva, reclast, forteo.  bilateral spontaneous femur fractures  . Recurrent cystitis   . Shortness of breath dyspnea   . Urinary incontinence   . Varicose veins    chronic venous insufficiency    Patient Active Problem List   Diagnosis Date Noted  . Pre-op evaluation 09/11/2015  . Health care maintenance 11/02/2014  . Breathlessness on exertion 07/29/2014  . 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 Hiatal hernia 07/01/2014  . Conjunctivitis 04/13/2014  . SOB (shortness of breath) 04/13/2014  . Post-nasal drainage 03/23/2014  . Chest pain 02/12/2014  . Rash 02/12/2014  . Cardiac murmur 02/12/2014  . Skin lesion 10/12/2013  . Carotid artery disease ([place]) 10/02/2013  . Renal artery stenosis ([place]) 06/12/2013  . Left upper quadrant pain 02/20/2013  . Calculus of kidney 05/01/2012  . Bilateral inguinal hernia 05/01/2012  .  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 Essential hypertension, benign 04/25/2012  . Abdominal pain, right upper quadrant 04/25/2012  . Renal colic 0000000  . Symptoms involving urinary system 04/25/2012  . Urge incontinence 04/25/2012  . FOM (frequency of micturition) 04/25/2012  . Hypothyroidism 03/02/2012  . GERD (gastroesophageal reflux disease) 03/02/2012  . Urinary incontinence 03/02/2012  . Osteoporosis 03/02/2012  . Degenerative arthritis 03/02/2012  . Hypercholesterolemia 02/28/2012  . Bladder infection, chronic 11/23/2011  . Incomplete bladder emptying 11/23/2011  . LBP (low back pain) 11/23/2011  . Mixed incontinence 11/23/2011  . Atrophic vaginitis 11/23/2011  . Infection of urinary tract 11/23/2011  . Cystocele, midline 11/23/2011    Past Surgical History:  Procedure Laterality Date  . ABDOMINAL HYSTERECTOMY    . BREAST REDUCTION SURGERY  1987  . CARPAL TUNNEL RELEASE Left 09/21/2015   Procedure: CARPAL TUNNEL RELEASE;  Surgeon: Earnestine Leys, MD;  Location: [facility] ORS;  Service: Orthopedics;  Laterality: Left;  . hysterectomy and bladder repair  1998  . L5-L6 fusion  2/07  . TONSILLECTOMY AND ADENOIDECTOMY  1950  . trigger finger repair     right fourth  . uterus suspension and sterilization  1963  . varicose vein ligation  1972  . varicose vein sclerotherapy     Dr Hulda Humphrey    Allergies Thimerosal  Social History Social History  Substance Use Topics  . Smoking status: Never Smoker  . Smokeless tobacco: Never Used  . Alcohol use 0.0 oz/week     Comment: rarely    Review of Systems Constitutional:  Negative for fever. Eyes: Negative for visual changes. ENT: Negative for sore throat. Cardiovascular: Negative for chest pain. Respiratory: Negative for shortness of breath. Gastrointestinal: Negative for abdominal pain, Positive for nausea Genitourinary: Negative for dysuria. Musculoskeletal: Negative for back pain. Skin: Negative  for rash. Neurological: Positive for weakness  10-point ROS otherwise negative.  ____________________________________________   PHYSICAL EXAM:  VITAL SIGNS: ED Triage Vitals  Enc Vitals Group     BP      Pulse      Resp      Temp      Temp src      SpO2      Weight      Height      Head Circumference      Peak Flow      Pain Score      Pain Loc      Pain Edu?      Excl. in [place]?     Constitutional: Alert and oriented. Well appearing and in no distress. Eyes: Conjunctivae are normal. PERRL. Normal extraocular movements. ENT   Head: Normocephalic and atraumatic.   Nose: No congestion/rhinnorhea.   Mouth/Throat: Mucous membranes are dry   Neck: No stridor. Cardiovascular: Normal rate, regular rhythm. No murmurs, rubs, or gallops. Respiratory: Normal respiratory effort without tachypnea nor retractions. Breath sounds are clear and equal bilaterally. No wheezes/rales/rhonchi. Gastrointestinal: Soft and nontender. Normal bowel sounds Musculoskeletal: Left arm distal to the elbow is in a splint, otherwise unremarkable Neurologic:  Normal speech and language. No gross focal neurologic deficits are appreciated. Strength appears to be normal Skin:  Skin is warm, dry and intact. No rash noted. Psychiatric: Mood and affect are normal. Speech and behavior are normal.  ____________________________________________  EKG: Interpreted by me.Sinus rhythm rate 87 bpm, normal PR interval, wide QRS, left bundle branch block, normal QT interval. Normal axis.  ____________________________________________  ED COURSE:  Pertinent labs & imaging results that were available during my care of the patient were reviewed by me and considered in my medical decision making (see chart for details). Clinical Course  Patient presents to ER with weakness, likely dehydrated. We will check basic labs, give IV fluid and reevaluate.  Procedures ____________________________________________    LABS (pertinent positives/negatives)  Labs Reviewed  COMPREHENSIVE METABOLIC PANEL - Abnormal; Notable for the following:       Result Value   Glucose, Bld 100 (*)    BUN 21 (*)    Creatinine, Ser 1.18 (*)    ALT 12 (*)    GFR calc non Af Amer 41 (*)    GFR calc Af Amer 48 (*)    All other components within normal limits  TROPONIN I - Abnormal; Notable for the following:    Troponin I 1.71 (*)    All other components within normal limits  CBC WITH DIFFERENTIAL/PLATELET  URINALYSIS COMPLETEWITH MICROSCOPIC ([facility] ONLY)   CRITICAL CARE Performed by: Earleen Newport   Total critical care time: 30 minutes  Critical care time was exclusive of separately billable procedures and treating other patients.  Critical care was necessary to treat or prevent imminent or life-threatening deterioration.  Critical care was time spent personally by me on the following activities: development of treatment plan with patient and/or surrogate as well as nursing, discussions with consultants, evaluation of patient's response to treatment, examination of patient, obtaining history from patient or surrogate, ordering and performing treatments and interventions, ordering and review of laboratory studies, ordering and review of radiographic studies, pulse  oximetry and re-evaluation of patient's condition.  RADIOLOGY Images were viewed by me  IMPRESSION: 1. No acute finding. 2. Large hiatal hernia with neighboring atelectasis or scar.   ____________________________________________  FINAL ASSESSMENT AND PLAN  Weakness, dehydration, Elevated troponin  Plan: Patient with labs and imaging as dictated above. Patient presented with weakness of uncertain etiology. This was initially thought to be dehydration related. She was some schooling found to have elevated troponin at 1.7. I have started her on heparin, we will discuss with the hospitalist for admission. I have discussed with cardiology  on-call. They state that Dr. Fletcher Anon will see the patient in the morning in the hospital.   Earleen Newport, MD   Note: This dictation was prepared with Dragon dictation. Any transcriptional errors that result from this process are unintentional    Earleen Newport, MD 09/23/15 [phone]

## 2015-09-24 ENCOUNTER — Encounter
Admission: EM | Disposition: A | Discharge status: Skilled Nursing Facility | Payer: Self-pay | Source: Home / Self Care | Attending: Internal Medicine

## 2015-09-24 ENCOUNTER — Encounter: Payer: Self-pay | Admitting: *Deleted

## 2015-09-24 DIAGNOSIS — I2510 Atherosclerotic heart disease of native coronary artery without angina pectoris: Secondary | ICD-10-CM

## 2015-09-24 DIAGNOSIS — R110 Nausea: Secondary | ICD-10-CM

## 2015-09-24 DIAGNOSIS — E785 Hyperlipidemia, unspecified: Secondary | ICD-10-CM

## 2015-09-24 HISTORY — PX: CARDIAC CATHETERIZATION: SHX172

## 2015-09-24 LAB — BASIC METABOLIC PANEL
Anion gap: 2 — ABNORMAL LOW (ref 5–15)
BUN: 20 mg/dL (ref 6–20)
CALCIUM: 8.7 mg/dL — AB (ref 8.9–10.3)
CHLORIDE: 111 mmol/L (ref 101–111)
CO2: 26 mmol/L (ref 22–32)
CREATININE: 0.99 mg/dL (ref 0.44–1.00)
GFR calc non Af Amer: 51 mL/min — ABNORMAL LOW (ref >60)
GFR, EST AFRICAN AMERICAN: 59 mL/min — AB (ref >60)
Glucose, Bld: 105 mg/dL — ABNORMAL HIGH (ref 65–99)
Potassium: 4 mmol/L (ref 3.5–5.1)
SODIUM: 139 mmol/L (ref 135–145)

## 2015-09-24 LAB — TROPONIN I: Troponin I: 1.72 ng/mL (ref <0.03)

## 2015-09-24 LAB — CBC
HCT: 37.8 % (ref 35.0–47.0)
Hemoglobin: 13.2 g/dL (ref 12.0–16.0)
MCH: 32.4 pg (ref 26.0–34.0)
MCHC: 34.8 g/dL (ref 32.0–36.0)
MCV: 93.1 fL (ref 80.0–100.0)
PLATELETS: 168 10*3/uL (ref 150–440)
RBC: 4.07 MIL/uL (ref 3.80–5.20)
RDW: 13.4 % (ref 11.5–14.5)
WBC: 6.5 10*3/uL (ref 3.6–11.0)

## 2015-09-24 LAB — HEPARIN LEVEL (UNFRACTIONATED)
HEPARIN UNFRACTIONATED: 0.27 [IU]/mL — AB (ref 0.30–0.70)
Heparin Unfractionated: 0.36 IU/mL (ref 0.30–0.70)

## 2015-09-24 LAB — LIPID PANEL
CHOLESTEROL: 127 mg/dL (ref 0–200)
HDL: 32 mg/dL — ABNORMAL LOW (ref >40)
LDL Cholesterol: 73 mg/dL (ref 0–99)
Total CHOL/HDL Ratio: 4 RATIO
Triglycerides: 111 mg/dL (ref <150)
VLDL: 22 mg/dL (ref 0–40)

## 2015-09-24 LAB — ECHOCARDIOGRAM LIMITED
Height: 59 in
WEIGHTICAEL: 2160 [oz_av]

## 2015-09-24 SURGERY — LEFT HEART CATH AND CORONARY ANGIOGRAPHY

## 2015-09-24 MED ORDER — SODIUM CHLORIDE 0.9% FLUSH: 3.0000 mL | Freq: Two times a day (BID) | INTRAVENOUS | Status: DC

## 2015-09-24 MED ORDER — CLOPIDOGREL BISULFATE 75 MG PO TABS
ORAL_TABLET | ORAL | Status: DC | PRN
  Administered 2015-09-24: 600 mg via ORAL

## 2015-09-24 MED ORDER — NITROGLYCERIN 5 MG/ML IV SOLN
INTRAVENOUS | Status: AC
  Filled 2015-09-24: qty 10

## 2015-09-24 MED ORDER — CARVEDILOL 3.125 MG PO TABS
3.1250 mg | ORAL_TABLET | Freq: Two times a day (BID) | ORAL | Status: DC
  Administered 2015-09-24: 3.125 mg via ORAL
  Filled 2015-09-24: qty 1

## 2015-09-24 MED ORDER — SODIUM CHLORIDE 0.9 % IV SOLN
INTRAVENOUS | Status: DC | PRN
  Administered 2015-09-24: 1.75 mg/kg/h via INTRAVENOUS

## 2015-09-24 MED ORDER — FENTANYL CITRATE (PF) 100 MCG/2ML IJ SOLN
INTRAMUSCULAR | Status: AC
  Filled 2015-09-24: qty 2

## 2015-09-24 MED ORDER — BIVALIRUDIN 250 MG IV SOLR
INTRAVENOUS | Status: AC
  Filled 2015-09-24: qty 250

## 2015-09-24 MED ORDER — SODIUM CHLORIDE 0.9% FLUSH: 3.0000 mL | INTRAVENOUS | Status: DC | PRN

## 2015-09-24 MED ORDER — ASPIRIN 81 MG PO CHEW
CHEWABLE_TABLET | ORAL | Status: DC | PRN
  Administered 2015-09-24: 324 mg via ORAL

## 2015-09-24 MED ORDER — ATORVASTATIN CALCIUM 20 MG PO TABS
40.0000 mg | ORAL_TABLET | Freq: Every day | ORAL | Status: DC
  Administered 2015-09-25 – 2015-09-28 (×4): 40 mg via ORAL
  Filled 2015-09-24 (×4): qty 2

## 2015-09-24 MED ORDER — CLOPIDOGREL BISULFATE 75 MG PO TABS
75.0000 mg | ORAL_TABLET | Freq: Every day | ORAL | Status: DC
  Administered 2015-09-25 – 2015-09-28 (×4): 75 mg via ORAL
  Filled 2015-09-24 (×4): qty 1

## 2015-09-24 MED ORDER — ASPIRIN 81 MG PO CHEW: 81.0000 mg | CHEWABLE_TABLET | ORAL | Status: DC

## 2015-09-24 MED ORDER — SODIUM CHLORIDE 0.9 % IV SOLN: INTRAVENOUS | Status: AC

## 2015-09-24 MED ORDER — MIDAZOLAM HCL 2 MG/2ML IJ SOLN
INTRAMUSCULAR | Status: DC | PRN
  Administered 2015-09-24 (×2): 0.5 mg via INTRAVENOUS

## 2015-09-24 MED ORDER — CLOPIDOGREL BISULFATE 75 MG PO TABS
ORAL_TABLET | ORAL | Status: AC
  Filled 2015-09-24: qty 8

## 2015-09-24 MED ORDER — ASPIRIN 81 MG PO CHEW
CHEWABLE_TABLET | ORAL | Status: AC
  Filled 2015-09-24: qty 4

## 2015-09-24 MED ORDER — SODIUM CHLORIDE 0.9 % IV SOLN: 250.0000 mL | INTRAVENOUS | Status: DC | PRN

## 2015-09-24 MED ORDER — SODIUM CHLORIDE 0.9 % WEIGHT BASED INFUSION: 3.0000 mL/kg/h | INTRAVENOUS | Status: DC

## 2015-09-24 MED ORDER — SODIUM CHLORIDE 0.9% FLUSH
3.0000 mL | Freq: Two times a day (BID) | INTRAVENOUS | Status: DC
  Administered 2015-09-24 – 2015-09-28 (×10): 3 mL via INTRAVENOUS

## 2015-09-24 MED ORDER — OXYCODONE HCL 5 MG/5ML PO SOLN: 5.0000 mg | Freq: Once | ORAL | Status: DC | PRN

## 2015-09-24 MED ORDER — FENTANYL CITRATE (PF) 100 MCG/2ML IJ SOLN
INTRAMUSCULAR | Status: DC | PRN
  Administered 2015-09-24: 25 ug via INTRAVENOUS

## 2015-09-24 MED ORDER — OXYCODONE HCL 5 MG PO TABS: 5.0000 mg | ORAL_TABLET | Freq: Once | ORAL | Status: DC | PRN

## 2015-09-24 MED ORDER — MIDAZOLAM HCL 2 MG/2ML IJ SOLN
INTRAMUSCULAR | Status: AC
  Filled 2015-09-24: qty 2

## 2015-09-24 MED ORDER — SODIUM CHLORIDE 0.9 % WEIGHT BASED INFUSION: 1.0000 mL/kg/h | INTRAVENOUS | Status: DC

## 2015-09-24 MED ORDER — BIVALIRUDIN BOLUS VIA INFUSION - CUPID
INTRAVENOUS | Status: DC | PRN
  Administered 2015-09-24: 45.9 mg via INTRAVENOUS

## 2015-09-24 MED ORDER — HEPARIN (PORCINE) IN NACL 2-0.9 UNIT/ML-% IJ SOLN
INTRAMUSCULAR | Status: AC
  Filled 2015-09-24: qty 1000

## 2015-09-24 MED ORDER — NITROGLYCERIN 1 MG/10 ML FOR IR/CATH LAB
INTRA_ARTERIAL | Status: DC | PRN
  Administered 2015-09-24 (×2): 200 ug via INTRACORONARY

## 2015-09-24 MED ORDER — FENTANYL CITRATE (PF) 100 MCG/2ML IJ SOLN: 25.0000 ug | INTRAMUSCULAR | Status: DC | PRN

## 2015-09-24 SURGICAL SUPPLY — 17 items
BALLN TREK RX 2.5X8 (BALLOONS) ×3
BALLOON TREK RX 2.5X8 (BALLOONS) ×1 IMPLANT
CATH INFINITI 5FR ANG PIGTAIL (CATHETERS) ×3 IMPLANT
CATH INFINITI 5FR JL4 (CATHETERS) ×3 IMPLANT
CATH INFINITI JR4 5F (CATHETERS) ×3 IMPLANT
CATH VISTA GUIDE 6FR JR4 (CATHETERS) ×3 IMPLANT
DEVICE CLOSURE MYNXGRIP 6/7F (Vascular Products) ×3 IMPLANT
DEVICE INFLAT 30 PLUS (MISCELLANEOUS) ×3 IMPLANT
DEVICE SAFEGUARD 24CM (GAUZE/BANDAGES/DRESSINGS) ×3 IMPLANT
KIT MANI 3VAL PERCEP (MISCELLANEOUS) ×3 IMPLANT
NEEDLE PERC 18GX7CM (NEEDLE) ×3 IMPLANT
PACK CARDIAC CATH (CUSTOM PROCEDURE TRAY) ×3 IMPLANT
SHEATH AVANTI 5FR X 11CM (SHEATH) ×3 IMPLANT
SHEATH AVANTI 6FR X 11CM (SHEATH) ×3 IMPLANT
STENT RESOLUTE INTEG 2.75X12 (Permanent Stent) ×3 IMPLANT
WIRE EMERALD 3MM-J .035X150CM (WIRE) ×3 IMPLANT
WIRE RUNTHROUGH .014X180CM (WIRE) ×3 IMPLANT

## 2015-09-24 NOTE — Consult Note (Signed)
ANTICOAGULATION CONSULT NOTE - Follow up [place] for heparin drip Indication: chest pain/ACS  Allergies  Allergen Reactions  . Thimerosal Rash    Patient Measurements: Height: 4\' 11"  (149.9 cm) Weight: 135 lb (61.2 kg) IBW/kg (Calculated) : 43.2 Heparin Dosing Weight: 56.2kg  Vital Signs: Temp: 98.6 F (37 C) (08/10 0903) Temp Source: Oral (08/10 0903) BP: 131/67 (08/10 1130) Pulse Rate: 69 (08/10 1130)  Labs:  Recent Labs  09/23/15 1302 09/23/15 1737 09/23/15 2121 09/24/15 0038 09/24/15 0843  HGB 14.3  --   --  13.2  --   HCT 40.8  --   --  37.8  --   PLT 200  --   --  168  --   APTT 31  --   --   --   --   LABPROT 14.2  --   --   --   --   INR 1.10  --   --   --   --   HEPARINUNFRC  --   --   --  0.36 0.27*  CREATININE 1.18*  --   --  0.99  --   TROPONINI 1.71* 1.99* 1.87* 1.72*  --     Estimated Creatinine Clearance: 34.3 mL/min (by C-G formula based on SCr of 0.99 mg/dL).   Medical History: Past Medical History:  Diagnosis Date  . Carotid artery occlusion   . Degenerative arthritis    lumbar spine.  spondylolisthesis, hip and leg pain, scoliosis  . Dyspnea on exertion    a. low-risk NST in 02/2014 b. echo 09/2015: EF 55% to 60%, no WMA, Grade 1 DD, mild AS, mild to moderate MS, mild MR, PA peak pressure of 45 mm Hg (S)  . GERD (gastroesophageal reflux disease)   . History of hiatal hernia   . Hypercholesterolemia   . Hypertension   . Hypothyroidism   . Nausea   . NSTEMI (non-ST elevated myocardial infarction) ([place]) 09/23/2015  . Osteoporosis    s/p fosamax, boniva, reclast, forteo.  bilateral spontaneous femur fractures  . Recurrent cystitis   . Shortness of breath dyspnea   . Urinary incontinence   . Varicose veins    chronic venous insufficiency    Medications:  Scheduled:  . [MAR Hold] acidophilus  1 capsule Oral Daily  . [MAR Hold] aspirin EC  81 mg Oral Daily  . [MAR Hold] calcium citrate-vitamin D  1 tablet Oral BID   . carvedilol  3.125 mg Oral BID WC  . [START ON 09/25/2015] clopidogrel  75 mg Oral Q breakfast  . [MAR Hold] fluticasone  1 spray Each Nare Daily  . [MAR Hold] gabapentin  400 mg Oral TID  . [MAR Hold] levothyroxine  75 mcg Oral QAC breakfast  . [MAR Hold] Melatonin  2.5 mg Oral Daily  . [MAR Hold] meloxicam  7.5 mg Oral Daily  . [MAR Hold] oxybutynin  5 mg Oral Daily  . [MAR Hold] pantoprazole  40 mg Oral Daily  . [MAR Hold] pravastatin  20 mg Oral Daily  . [MAR Hold] sodium chloride flush  3 mL Intravenous Q12H  . sodium chloride flush  3 mL Intravenous Q12H    Assessment: Pt is a 82 year old female who presents with weakness, found to have an elevated troponin. Pharmacy consulted to dose heparin drip. No home anticoagulants found on med rec. Baseline APTT, INR, CBC were ordered.  Goal of Therapy:  Heparin level 0.3-0.7 units/ml Monitor platelets by anticoagulation protocol: Yes  Plan:  Give 3350 units bolus x 1 Start heparin infusion at 700 units/hr Check anti-Xa level in 8 hours and daily while on heparin Continue to monitor H&H and platelets   8/10 00:30 heparin level 0.36. Continue current regimen. Recheck in 8 hours to confirm.  [id number] 0843 HL 0.27 but pt in cath lab. Heparin gtt d/c'd after cath.   Rayna Sexton, PharmD, BCPS Clinical Pharmacist 09/24/2015 11:52 AM

## 2015-09-24 NOTE — Progress Notes (Signed)
Left arm brace removed as per DR hower Julia Patterson and dry dressing applied, no drainage noted.

## 2015-09-24 NOTE — Interval H&P Note (Signed)
Cath Lab Visit (complete for each Cath Lab visit)  Clinical Evaluation Leading to the Procedure:   ACS: Yes.    Non-ACS:    Anginal Classification: CCS IV  Anti-ischemic medical therapy: Minimal Therapy (1 class of medications)  Non-Invasive Test Results: No non-invasive testing performed  Prior CABG: No previous CABG      History and Physical Interval Note:  09/24/2015 10:19 AM  Julia Patterson  has presented today for surgery, with the diagnosis of Dyspnea with Exertion; Myocardial Infarction  The various methods of treatment have been discussed with the patient and family. After consideration of risks, benefits and other options for treatment, the patient has consented to  Procedure(s): Left Heart Cath and Coronary Angiography (N/A) as a surgical intervention .  The patient's history has been reviewed, patient examined, no change in status, stable for surgery.  I have reviewed the patient's chart and labs.  Questions were answered to the patient's satisfaction.     Kathlyn Sacramento

## 2015-09-24 NOTE — Progress Notes (Signed)
Patient returned to unit from specials stent placed Right RCA minx closure, site dry and intact no bleeding or hematoma noted.

## 2015-09-24 NOTE — Care Management (Signed)
Presents from [employer] with NSTEMI. Patient on Heparin gtt. For cardiac cath today. PT pending for c/o of difficulty ambulating.

## 2015-09-24 NOTE — Consult Note (Signed)
ANTICOAGULATION CONSULT NOTE - Initial Consult  Pharmacy Consult for heparin drip Indication: chest pain/ACS  Allergies  Allergen Reactions  . Thimerosal Rash    Patient Measurements: Height: 4\' 11"  (149.9 cm) Weight: 135 lb (61.2 kg) IBW/kg (Calculated) : 43.2 Heparin Dosing Weight: 56.2kg  Vital Signs: Temp: 98.1 F (36.7 C) (08/09 1942) Temp Source: Oral (08/09 1942) BP: 96/47 (08/09 1942) Pulse Rate: 73 (08/09 1942)  Labs:  Recent Labs  09/23/15 1302 09/23/15 1737 09/23/15 2121 09/24/15 0038  HGB 14.3  --   --  13.2  HCT 40.8  --   --  37.8  PLT 200  --   --  168  APTT 31  --   --   --   LABPROT 14.2  --   --   --   INR 1.10  --   --   --   HEPARINUNFRC  --   --   --  0.36  CREATININE 1.18*  --   --   --   TROPONINI 1.71* 1.99* 1.87*  --     Estimated Creatinine Clearance: 28.7 mL/min (by C-G formula based on SCr of 1.18 mg/dL).   Medical History: Past Medical History:  Diagnosis Date  . Carotid artery occlusion   . Degenerative arthritis    lumbar spine.  spondylolisthesis, hip and leg pain, scoliosis  . Dyspnea on exertion    a. low-risk NST in 02/2014 b. echo 09/2015: EF 55% to 60%, no WMA, Grade 1 DD, mild AS, mild to moderate MS, mild MR, PA peak pressure of 45 mm Hg (S)  . GERD (gastroesophageal reflux disease)   . History of hiatal hernia   . Hypercholesterolemia   . Hypertension   . Hypothyroidism   . Nausea   . NSTEMI (non-ST elevated myocardial infarction) ([place]) 09/23/2015  . Osteoporosis    s/p fosamax, boniva, reclast, forteo.  bilateral spontaneous femur fractures  . Recurrent cystitis   . Shortness of breath dyspnea   . Urinary incontinence   . Varicose veins    chronic venous insufficiency    Medications:  Scheduled:  . acidophilus  1 capsule Oral Daily  . aspirin EC  81 mg Oral Daily  . calcium citrate-vitamin D  1 tablet Oral BID  . fluticasone  1 spray Each Nare Daily  . gabapentin  400 mg Oral TID  . levothyroxine  75  mcg Oral QAC breakfast  . Melatonin  2.5 mg Oral Daily  . meloxicam  7.5 mg Oral Daily  . oxybutynin  5 mg Oral Daily  . pantoprazole  40 mg Oral Daily  . pravastatin  20 mg Oral Daily  . sodium chloride flush  3 mL Intravenous Q12H    Assessment: Pt is a 82 year old female who presents with weakness, found to have an elevated troponin. Pharmacy consulted to dose heparin drip. No home anticoagulants found on med rec. Baseline APTT, INR, CBC were ordered.  Goal of Therapy:  Heparin level 0.3-0.7 units/ml Monitor platelets by anticoagulation protocol: Yes   Plan:  Give 3350 units bolus x 1 Start heparin infusion at 700 units/hr Check anti-Xa level in 8 hours and daily while on heparin Continue to monitor H&H and platelets   8/10 00:30 heparin level 0.36. Continue current regimen. Recheck in 8 hours to confirm.  Ramond Dial, Pharm.D Clinical Pharmacist  09/24/2015,1:34 AM

## 2015-09-24 NOTE — OR Nursing (Signed)
bp cuff not placed on left arm per pt request, she reports stenosis on that side, IV fluid switched to left ac from right.

## 2015-09-24 NOTE — Progress Notes (Signed)
PT Cancellation Note  Patient Details Name: Julia Patterson MRN: [id number] DOB: 06/06/1929   Cancelled Treatment:    Reason Eval/Treat Not Completed: Medical issues which prohibited therapy; Consult received, per verbal order from Dr. Fletcher Anon will hold PT Eval until 09/25/15 secondary to pt having undergone cardiac cath and stent placement 09/24/15.   Linus Salmons PT, DPT 09/24/15, 2:22 PM

## 2015-09-24 NOTE — Progress Notes (Signed)
[facility] PROGRESS NOTE  Julia Patterson [id number] DOB: 1929/10/04 DOA: 09/23/2015 PCP: Einar Pheasant, MD  HPI/Subjective: Patient complains of weakness in her legs gave out on her. In the ER she was found to have an elevated troponin and admitted for an acute myocardial infarction. Patient denies any chest pain or shortness of breath. She states that she recently underwent a carpal tunnel release and then had nausea vomiting afterwards.  Objective: Vitals:   09/24/15 1322 09/24/15 1650  BP: (!) 133/58 (!) 157/77  Pulse: 70 76  Resp: 18 18  Temp: 98.3 F (36.8 C) 98.8 F (37.1 C)    Filed Weights   09/23/15 1216  Weight: 61.2 kg (135 lb)    ROS: Review of Systems  Constitutional: Negative for chills and fever.  Eyes: Negative for blurred vision.  Respiratory: Negative for cough and shortness of breath.   Cardiovascular: Negative for chest pain.  Gastrointestinal: Negative for abdominal pain, constipation, diarrhea, nausea and vomiting.  Genitourinary: Negative for dysuria.  Musculoskeletal: Negative for joint pain.  Neurological: Positive for weakness. Negative for dizziness and headaches.   Exam: Physical Exam  Constitutional: She is oriented to person, place, and time.  HENT:  Nose: No mucosal edema.  Mouth/Throat: No oropharyngeal exudate or posterior oropharyngeal edema.  Eyes: Conjunctivae, EOM and lids are normal. Pupils are equal, round, and reactive to light.  Neck: No JVD present. Carotid bruit is not present. No edema present. No thyroid mass and no thyromegaly present.  Cardiovascular: S1 normal and S2 normal.  Exam reveals no gallop.   Murmur heard.  Systolic murmur is present with a grade of 2/6  Pulses:      Dorsalis pedis pulses are 2+ on the right side, and 2+ on the left side.  Respiratory: No respiratory distress. She has no wheezes. She has no rhonchi. She has no rales.  GI: Soft. Bowel sounds are normal. There is no tenderness.   Musculoskeletal:       Right ankle: She exhibits swelling.       Left ankle: She exhibits swelling.  This morning left hand was wrapped in a large cast.  Lymphadenopathy:    She has no cervical adenopathy.  Neurological: She is alert and oriented to person, place, and time. No cranial nerve deficit.  Skin: Skin is warm. No rash noted. Nails show no clubbing.  Psychiatric: She has a normal mood and affect.      Data Reviewed: Basic Metabolic Panel:  Recent Labs Lab 09/23/15 1302 09/24/15 0038  NA 140 139  K 3.8 4.0  CL 105 111  CO2 29 26  GLUCOSE 100* 105*  BUN 21* 20  CREATININE 1.18* 0.99  CALCIUM 9.6 8.7*   Liver Function Tests:  Recent Labs Lab 09/23/15 1302  AST 30  ALT 12*  ALKPHOS 44  BILITOT 0.5  PROT 6.5  ALBUMIN 3.8   CBC:  Recent Labs Lab 09/23/15 1302 09/24/15 0038  WBC 8.3 6.5  NEUTROABS 6.0  --   HGB 14.3 13.2  HCT 40.8 37.8  MCV 93.5 93.1  PLT 200 168   Cardiac Enzymes:  Recent Labs Lab 09/23/15 1302 09/23/15 1737 09/23/15 2121 09/24/15 0038  TROPONINI 1.71* 1.99* 1.87* 1.72*     Studies: Dg Chest 1 View  Result Date: 09/23/2015 CLINICAL DATA:  Inability to walk. EXAM: CHEST 1 VIEW COMPARISON:  Chest CT 06/24/2014 FINDINGS: Cardiomegaly with heart size accentuated by rotation. Chronic large herniation of stomach and fat at the hiatus. Bibasilar  streaky opacity correlating with scar atelectasis on the previous chest CT. There is no edema, consolidation, effusion, or pneumothorax. IMPRESSION: 1. No acute finding. 2. Large hiatal hernia with neighboring atelectasis or scar. Electronically Signed   By: Monte Fantasia M.D.   On: 09/23/2015 13:31   Ct Head Wo Contrast  Result Date: 09/23/2015 CLINICAL DATA:  82 year old female with generalize weakness. Initial encounter. EXAM: CT HEAD WITHOUT CONTRAST TECHNIQUE: Contiguous axial images were obtained from the base of the skull through the vertex without intravenous contrast. COMPARISON:   02/25/2006 CT. FINDINGS: No intracranial hemorrhage. No CT evidence of large acute infarct. Partial volume averaging with ventricle (series 2, image 16). No intracranial mass lesion noted on this unenhanced exam. Mild atrophy without hydrocephalus. Visualized mastoid air cells, middle ear cavities and paranasal sinuses are clear. No acute orbital abnormality. Aspects score: 10 IMPRESSION: No acute intracranial abnormality. Electronically Signed   By: Genia Del M.D.   On: 09/23/2015 12:50   Ct Angio Chest Pe W And/or Wo Contrast  Result Date: 09/23/2015 CLINICAL DATA:  Dyspnea, hypoxia, and elevated troponin. EXAM: CT ANGIOGRAPHY CHEST WITH CONTRAST TECHNIQUE: Multidetector CT imaging of the chest was performed using the standard protocol during bolus administration of intravenous contrast. Multiplanar CT image reconstructions and MIPs were obtained to evaluate the vascular anatomy. CONTRAST:  60 cc Isovue 370 COMPARISON:  Chest x-ray dated 09/23/2015 and CT scan dated 06/24/2014 FINDINGS: Mediastinum/Lymph Nodes: Calcification in the thoracic aorta and in the coronary arteries and mitral valve annulus. Overall heart size is normal. RV LV ratio is normal. Lungs/Pleura: Slight chronic atelectasis at the lung bases due to huge hiatal hernia. Focal bronchiectasis at both lung bases, unchanged. Lungs are otherwise clear. No pulmonary emboli. Upper abdomen: Large hiatal hernia. Aortic atherosclerosis with stenoses of the origins of the celiac and superior mesenteric arteries. Musculoskeletal: Thoracic scoliosis and accentuated kyphosis. No acute abnormalities. Review of the MIP images confirms the above findings. IMPRESSION: No pulmonary emboli. Aortic atherosclerosis. Huge chronic hiatal hernia with chronic atelectasis and bronchiectasis at the lung bases. Electronically Signed   By: Lorriane Shire M.D.   On: 09/23/2015 15:05    Scheduled Meds: . acidophilus  1 capsule Oral Daily  . aspirin EC  81 mg Oral  Daily  . [START ON 09/25/2015] atorvastatin  40 mg Oral q1800  . calcium citrate-vitamin D  1 tablet Oral BID  . carvedilol  3.125 mg Oral BID WC  . [START ON 09/25/2015] clopidogrel  75 mg Oral Q breakfast  . fluticasone  1 spray Each Nare Daily  . gabapentin  400 mg Oral TID  . levothyroxine  75 mcg Oral QAC breakfast  . Melatonin  2.5 mg Oral Daily  . oxybutynin  5 mg Oral Daily  . pantoprazole  40 mg Oral Daily  . sodium chloride flush  3 mL Intravenous Q12H  . sodium chloride flush  3 mL Intravenous Q12H   Continuous Infusions: . sodium chloride 75 mL/hr at 09/23/15 2152  . sodium chloride      Assessment/Plan:  1. Acute myocardial infarction. Stent placed today with cardiac catheter. Aspirin, Plavix, atorvastatin prescribed. Patient will be watched overnight and ambulate tomorrow morning and see how she feels. 2. Recent left carpal tunnel release. Case discussed with Dr. Earnestine Leys to change dressing today. 3. Nausea vomiting postoperatively could've been where the event was triggered. 4. Hypothyroidism unspecified continue levothyroxine 5. GERD on Protonix  6. Hyperlipidemia unspecified. LDL 73. Change to high intensity statin.  Code Status:     Code Status Orders        Start     Ordered   09/24/15 1136  Full code  Continuous     09/24/15 1135    Code Status History    Date Active Date Inactive Code Status Order ID Comments User Context   09/23/2015  5:13 PM 09/23/2015  7:39 PM Full Code [id number]  Henreitta Leber, MD Inpatient    Advance Directive Documentation   Flowsheet Row Most Recent Value  Type of Advance Directive  Living will  Pre-existing out of facility DNR order (yellow form or pink MOST form)  No data  "MOST" Form in Place?  No data     Family Communication: Case discussed with Dr. [name] DeFrancesco Disposition Plan: Potential discharge tomorrow  Consultants:  Cardiology  Orthopedic surgery  Procedures:  Cardiac catheter and  stent  Time spent: 25 minutes  [place], [place]

## 2015-09-24 NOTE — Progress Notes (Signed)
Patient: Julia Patterson / Admit Date: 09/23/2015 / Date of Encounter: 09/24/2015, 7:37 AM   Subjective: No chest pain. No SOB. No further nausea. Troponin peaked at 1.99. Currently 1.72 as of 12:38 AM on 8/10. CBC unremarkable. Renal function and potassium stable. She is for cardiac catheterization this morning with Dr. Fletcher Anon, MD.   Review of Systems: Review of Systems  Constitutional: Positive for malaise/fatigue. Negative for chills, diaphoresis, fever and weight loss.  HENT: Negative for congestion.   Eyes: Negative for discharge and redness.  Respiratory: Negative for cough, sputum production, shortness of breath and wheezing.   Cardiovascular: Negative for chest pain, palpitations, orthopnea, claudication, leg swelling and PND.  Gastrointestinal: Positive for nausea. Negative for abdominal pain, blood in stool, heartburn, melena and vomiting.  Genitourinary: Negative for hematuria.  Musculoskeletal: Negative for falls and myalgias.  Skin: Negative for rash.  Neurological: Positive for weakness. Negative for dizziness, tingling, tremors, sensory change, speech change, focal weakness and loss of consciousness.  Endo/Heme/Allergies: Does not bruise/bleed easily.  Psychiatric/Behavioral: Negative for substance abuse. The patient is not nervous/anxious.   All other systems reviewed and are negative.   Objective: Telemetry: NSR, 70's bpm Physical Exam: Blood pressure 125/66, pulse 76, temperature 98 F (36.7 C), resp. rate 18, height 4\' 11"  (1.499 m), weight 135 lb (61.2 kg), SpO2 94 %. Body mass index is 27.27 kg/m. General: Well developed, well nourished, in no acute distress. Head: Normocephalic, atraumatic, sclera non-icteric, no xanthomas, nares are without discharge. Neck: Negative for carotid bruits. JVP not elevated. Lungs: Clear bilaterally to auscultation without wheezes, rales, or rhonchi. Breathing is unlabored. Heart: RRR S1 S2 without murmurs, rubs, or gallops.    Abdomen: Soft, non-tender, non-distended with normoactive bowel sounds. No rebound/guarding. Extremities: No clubbing or cyanosis. No edema. Distal pedal pulses are 2+ and equal bilaterally. Left wrist in a cast.  Neuro: Alert and oriented X 3. Moves all extremities spontaneously. Psych:  Responds to questions appropriately with a normal affect.   Intake/Output Summary (Last 24 hours) at 09/24/15 0737 Last data filed at 09/24/15 0100  Gross per 24 hour  Intake           451.92 ml  Output              450 ml  Net             1.92 ml    Inpatient Medications:  . acidophilus  1 capsule Oral Daily  . aspirin EC  81 mg Oral Daily  . calcium citrate-vitamin D  1 tablet Oral BID  . fluticasone  1 spray Each Nare Daily  . gabapentin  400 mg Oral TID  . levothyroxine  75 mcg Oral QAC breakfast  . Melatonin  2.5 mg Oral Daily  . meloxicam  7.5 mg Oral Daily  . oxybutynin  5 mg Oral Daily  . pantoprazole  40 mg Oral Daily  . pravastatin  20 mg Oral Daily  . sodium chloride flush  3 mL Intravenous Q12H   Infusions:  . sodium chloride 75 mL/hr at 09/23/15 2152  . heparin 700 Units/hr (09/23/15 1635)    Labs:  Recent Labs  09/23/15 1302 09/24/15 0038  NA 140 139  K 3.8 4.0  CL 105 111  CO2 29 26  GLUCOSE 100* 105*  BUN 21* 20  CREATININE 1.18* 0.99  CALCIUM 9.6 8.7*    Recent Labs  09/23/15 1302  AST 30  ALT 12*  ALKPHOS 44  BILITOT  0.5  PROT 6.5  ALBUMIN 3.8    Recent Labs  09/23/15 1302 09/24/15 0038  WBC 8.3 6.5  NEUTROABS 6.0  --   HGB 14.3 13.2  HCT 40.8 37.8  MCV 93.5 93.1  PLT 200 168    Recent Labs  09/23/15 1302 09/23/15 1737 09/23/15 2121 09/24/15 0038  TROPONINI 1.71* 1.99* 1.87* 1.72*   Invalid input(s): POCBNP No results for input(s): HGBA1C in the last 72 hours.   Weights: Filed Weights   09/23/15 1216  Weight: 135 lb (61.2 kg)     Radiology/Studies:  Dg Chest 1 View  Result Date: 09/23/2015 CLINICAL DATA:  Inability to  walk. EXAM: CHEST 1 VIEW COMPARISON:  Chest CT 06/24/2014 FINDINGS: Cardiomegaly with heart size accentuated by rotation. Chronic large herniation of stomach and fat at the hiatus. Bibasilar streaky opacity correlating with scar atelectasis on the previous chest CT. There is no edema, consolidation, effusion, or pneumothorax. IMPRESSION: 1. No acute finding. 2. Large hiatal hernia with neighboring atelectasis or scar. Electronically Signed   By: Monte Fantasia M.D.   On: 09/23/2015 13:31   Ct Head Wo Contrast  Result Date: 09/23/2015 CLINICAL DATA:  82 year old female with generalize weakness. Initial encounter. EXAM: CT HEAD WITHOUT CONTRAST TECHNIQUE: Contiguous axial images were obtained from the base of the skull through the vertex without intravenous contrast. COMPARISON:  02/25/2006 CT. FINDINGS: No intracranial hemorrhage. No CT evidence of large acute infarct. Partial volume averaging with ventricle (series 2, image 16). No intracranial mass lesion noted on this unenhanced exam. Mild atrophy without hydrocephalus. Visualized mastoid air cells, middle ear cavities and paranasal sinuses are clear. No acute orbital abnormality. Aspects score: 10 IMPRESSION: No acute intracranial abnormality. Electronically Signed   By: Genia Del M.D.   On: 09/23/2015 12:50   Ct Angio Chest Pe W And/or Wo Contrast  Result Date: 09/23/2015 CLINICAL DATA:  Dyspnea, hypoxia, and elevated troponin. EXAM: CT ANGIOGRAPHY CHEST WITH CONTRAST TECHNIQUE: Multidetector CT imaging of the chest was performed using the standard protocol during bolus administration of intravenous contrast. Multiplanar CT image reconstructions and MIPs were obtained to evaluate the vascular anatomy. CONTRAST:  60 cc Isovue 370 COMPARISON:  Chest x-ray dated 09/23/2015 and CT scan dated 06/24/2014 FINDINGS: Mediastinum/Lymph Nodes: Calcification in the thoracic aorta and in the coronary arteries and mitral valve annulus. Overall heart size is normal.  RV LV ratio is normal. Lungs/Pleura: Slight chronic atelectasis at the lung bases due to huge hiatal hernia. Focal bronchiectasis at both lung bases, unchanged. Lungs are otherwise clear. No pulmonary emboli. Upper abdomen: Large hiatal hernia. Aortic atherosclerosis with stenoses of the origins of the celiac and superior mesenteric arteries. Musculoskeletal: Thoracic scoliosis and accentuated kyphosis. No acute abnormalities. Review of the MIP images confirms the above findings. IMPRESSION: No pulmonary emboli. Aortic atherosclerosis. Huge chronic hiatal hernia with chronic atelectasis and bronchiectasis at the lung bases. Electronically Signed   By: Lorriane Shire M.D.   On: 09/23/2015 15:05     Assessment and Plan  Principal Problem:   NSTEMI (non-ST elevated myocardial infarction) [facility]) Active Problems:   Nausea   Hypercholesterolemia   Weakness    1. NSTEMI: -Never with chest pain or SOB -Had persistent nausea and weakness s/p surgery, was found to have elevated troponin in the ED -Peak troponin 1.99 -She is chest pain free currently on heparin gtt -Cardiac cath this morning with Dr. Fletcher Anon, MD -Risks and benefits of cardiac catheterization have been discussed with the patient including risks of  bleeding, bruising, infection, kidney damage, stroke, heart attack, and death. The patient understands these risks and is willing to proceed with the procedure. All questions have been answered and concerns listened to -Aspirin  -Start low-dose beta blocker s/p cardiac cath if BP allows, has been hypotensive overnight which precludes addition of this pre-cath -Lipid panel pending, if LDL not at goal on pravastatin change to Lipitor  2. HLD: -As above  3. Carotid artery disease: -Followed by vascular  4. Weakness/nausea: -Had a fall day prior to admission, prior to this fall she does not recall any further falls -Possibly per #1 -Further work up per IM  Signed, Christell Faith, PA-C [place] Pager: [phone] 09/24/2015, 7:37 AM

## 2015-09-24 NOTE — H&P (View-Only) (Signed)
Patient: Julia Patterson / Admit Date: 09/23/2015 / Date of Encounter: 09/24/2015, 7:37 AM   Subjective: No chest pain. No SOB. No further nausea. Troponin peaked at 1.99. Currently 1.72 as of 12:38 AM on 8/10. CBC unremarkable. Renal function and potassium stable. She is for cardiac catheterization this morning with Dr. Fletcher Anon, MD.   Review of Systems: Review of Systems  Constitutional: Positive for malaise/fatigue. Negative for chills, diaphoresis, fever and weight loss.  HENT: Negative for congestion.   Eyes: Negative for discharge and redness.  Respiratory: Negative for cough, sputum production, shortness of breath and wheezing.   Cardiovascular: Negative for chest pain, palpitations, orthopnea, claudication, leg swelling and PND.  Gastrointestinal: Positive for nausea. Negative for abdominal pain, blood in stool, heartburn, melena and vomiting.  Genitourinary: Negative for hematuria.  Musculoskeletal: Negative for falls and myalgias.  Skin: Negative for rash.  Neurological: Positive for weakness. Negative for dizziness, tingling, tremors, sensory change, speech change, focal weakness and loss of consciousness.  Endo/Heme/Allergies: Does not bruise/bleed easily.  Psychiatric/Behavioral: Negative for substance abuse. The patient is not nervous/anxious.   All other systems reviewed and are negative.   Objective: Telemetry: NSR, 70's bpm Physical Exam: Blood pressure 125/66, pulse 76, temperature 98 F (36.7 C), resp. rate 18, height 4\' 11"  (1.499 m), weight 135 lb (61.2 kg), SpO2 94 %. Body mass index is 27.27 kg/m. General: Well developed, well nourished, in no acute distress. Head: Normocephalic, atraumatic, sclera non-icteric, no xanthomas, nares are without discharge. Neck: Negative for carotid bruits. JVP not elevated. Lungs: Clear bilaterally to auscultation without wheezes, rales, or rhonchi. Breathing is unlabored. Heart: RRR S1 S2 without murmurs, rubs, or gallops.    Abdomen: Soft, non-tender, non-distended with normoactive bowel sounds. No rebound/guarding. Extremities: No clubbing or cyanosis. No edema. Distal pedal pulses are 2+ and equal bilaterally. Left wrist in a cast.  Neuro: Alert and oriented X 3. Moves all extremities spontaneously. Psych:  Responds to questions appropriately with a normal affect.   Intake/Output Summary (Last 24 hours) at 09/24/15 0737 Last data filed at 09/24/15 0100  Gross per 24 hour  Intake           451.92 ml  Output              450 ml  Net             1.92 ml    Inpatient Medications:  . acidophilus  1 capsule Oral Daily  . aspirin EC  81 mg Oral Daily  . calcium citrate-vitamin D  1 tablet Oral BID  . fluticasone  1 spray Each Nare Daily  . gabapentin  400 mg Oral TID  . levothyroxine  75 mcg Oral QAC breakfast  . Melatonin  2.5 mg Oral Daily  . meloxicam  7.5 mg Oral Daily  . oxybutynin  5 mg Oral Daily  . pantoprazole  40 mg Oral Daily  . pravastatin  20 mg Oral Daily  . sodium chloride flush  3 mL Intravenous Q12H   Infusions:  . sodium chloride 75 mL/hr at 09/23/15 2152  . heparin 700 Units/hr (09/23/15 1635)    Labs:  Recent Labs  09/23/15 1302 09/24/15 0038  NA 140 139  K 3.8 4.0  CL 105 111  CO2 29 26  GLUCOSE 100* 105*  BUN 21* 20  CREATININE 1.18* 0.99  CALCIUM 9.6 8.7*    Recent Labs  09/23/15 1302  AST 30  ALT 12*  ALKPHOS 44  BILITOT  0.5  PROT 6.5  ALBUMIN 3.8    Recent Labs  09/23/15 1302 09/24/15 0038  WBC 8.3 6.5  NEUTROABS 6.0  --   HGB 14.3 13.2  HCT 40.8 37.8  MCV 93.5 93.1  PLT 200 168    Recent Labs  09/23/15 1302 09/23/15 1737 09/23/15 2121 09/24/15 0038  TROPONINI 1.71* 1.99* 1.87* 1.72*   Invalid input(s): POCBNP No results for input(s): HGBA1C in the last 72 hours.   Weights: Filed Weights   09/23/15 1216  Weight: 135 lb (61.2 kg)     Radiology/Studies:  Dg Chest 1 View  Result Date: 09/23/2015 CLINICAL DATA:  Inability to  walk. EXAM: CHEST 1 VIEW COMPARISON:  Chest CT 06/24/2014 FINDINGS: Cardiomegaly with heart size accentuated by rotation. Chronic large herniation of stomach and fat at the hiatus. Bibasilar streaky opacity correlating with scar atelectasis on the previous chest CT. There is no edema, consolidation, effusion, or pneumothorax. IMPRESSION: 1. No acute finding. 2. Large hiatal hernia with neighboring atelectasis or scar. Electronically Signed   By: Monte Fantasia M.D.   On: 09/23/2015 13:31   Ct Head Wo Contrast  Result Date: 09/23/2015 CLINICAL DATA:  82 year old female with generalize weakness. Initial encounter. EXAM: CT HEAD WITHOUT CONTRAST TECHNIQUE: Contiguous axial images were obtained from the base of the skull through the vertex without intravenous contrast. COMPARISON:  02/25/2006 CT. FINDINGS: No intracranial hemorrhage. No CT evidence of large acute infarct. Partial volume averaging with ventricle (series 2, image 16). No intracranial mass lesion noted on this unenhanced exam. Mild atrophy without hydrocephalus. Visualized mastoid air cells, middle ear cavities and paranasal sinuses are clear. No acute orbital abnormality. Aspects score: 10 IMPRESSION: No acute intracranial abnormality. Electronically Signed   By: Genia Del M.D.   On: 09/23/2015 12:50   Ct Angio Chest Pe W And/or Wo Contrast  Result Date: 09/23/2015 CLINICAL DATA:  Dyspnea, hypoxia, and elevated troponin. EXAM: CT ANGIOGRAPHY CHEST WITH CONTRAST TECHNIQUE: Multidetector CT imaging of the chest was performed using the standard protocol during bolus administration of intravenous contrast. Multiplanar CT image reconstructions and MIPs were obtained to evaluate the vascular anatomy. CONTRAST:  60 cc Isovue 370 COMPARISON:  Chest x-ray dated 09/23/2015 and CT scan dated 06/24/2014 FINDINGS: Mediastinum/Lymph Nodes: Calcification in the thoracic aorta and in the coronary arteries and mitral valve annulus. Overall heart size is normal.  RV LV ratio is normal. Lungs/Pleura: Slight chronic atelectasis at the lung bases due to huge hiatal hernia. Focal bronchiectasis at both lung bases, unchanged. Lungs are otherwise clear. No pulmonary emboli. Upper abdomen: Large hiatal hernia. Aortic atherosclerosis with stenoses of the origins of the celiac and superior mesenteric arteries. Musculoskeletal: Thoracic scoliosis and accentuated kyphosis. No acute abnormalities. Review of the MIP images confirms the above findings. IMPRESSION: No pulmonary emboli. Aortic atherosclerosis. Huge chronic hiatal hernia with chronic atelectasis and bronchiectasis at the lung bases. Electronically Signed   By: Lorriane Shire M.D.   On: 09/23/2015 15:05     Assessment and Plan  Principal Problem:   NSTEMI (non-ST elevated myocardial infarction) [facility]) Active Problems:   Nausea   Hypercholesterolemia   Weakness    1. NSTEMI: -Never with chest pain or SOB -Had persistent nausea and weakness s/p surgery, was found to have elevated troponin in the ED -Peak troponin 1.99 -She is chest pain free currently on heparin gtt -Cardiac cath this morning with Dr. Fletcher Anon, MD -Risks and benefits of cardiac catheterization have been discussed with the patient including risks of  bleeding, bruising, infection, kidney damage, stroke, heart attack, and death. The patient understands these risks and is willing to proceed with the procedure. All questions have been answered and concerns listened to -Aspirin  -Start low-dose beta blocker s/p cardiac cath if BP allows, has been hypotensive overnight which precludes addition of this pre-cath -Lipid panel pending, if LDL not at goal on pravastatin change to Lipitor  2. HLD: -As above  3. Carotid artery disease: -Followed by vascular  4. Weakness/nausea: -Had a fall day prior to admission, prior to this fall she does not recall any further falls -Possibly per #1 -Further work up per IM  Signed, Christell Faith, PA-C [place] Pager: [phone] 09/24/2015, 7:37 AM

## 2015-09-24 NOTE — Progress Notes (Signed)
Subjective: 3 Days Post-Op Procedure(s) (LRB): CARPAL TUNNEL RELEASE (Left)    Patient reports pain as none. Hand is doing very well and she feels better overall.  Will have nursing change dressings and she will reapply her wrist brace at home tomorrow.  Should see me in 10 days.  Objective:   VITALS:   Vitals:   09/21/15 1458 09/21/15 1605  BP: (!) 156/78 111/82  Pulse: 94 77  Resp: 16 16  Temp:      Neurologically intact Neurovascular intact Sensation intact distally Intact pulses distally moves fingers well  LABS  Recent Labs  09/23/15 1302 09/24/15 0038  HGB 14.3 13.2  HCT 40.8 37.8  WBC 8.3 6.5  PLT 200 168     Recent Labs  09/23/15 1302 09/24/15 0038  NA 140 139  K 3.8 4.0  BUN 21* 20  CREATININE 1.18* 0.99  GLUCOSE 100* 105*     Recent Labs  09/23/15 1302  INR 1.10     Assessment/Plan: 3 Days Post-Op Procedure(s) (LRB): CARPAL TUNNEL RELEASE (Left)   RTC 10 days

## 2015-09-25 ENCOUNTER — Encounter: Payer: Self-pay | Admitting: Cardiovascular Disease

## 2015-09-25 ENCOUNTER — Inpatient Hospital Stay: Payer: Medicare Other

## 2015-09-25 ENCOUNTER — Telehealth: Payer: Self-pay | Admitting: *Deleted

## 2015-09-25 LAB — CBC
HCT: 39 % (ref 35.0–47.0)
HEMOGLOBIN: 13.7 g/dL (ref 12.0–16.0)
MCH: 32.3 pg (ref 26.0–34.0)
MCHC: 35 g/dL (ref 32.0–36.0)
MCV: 92.3 fL (ref 80.0–100.0)
PLATELETS: 188 10*3/uL (ref 150–440)
RBC: 4.22 MIL/uL (ref 3.80–5.20)
RDW: 13.2 % (ref 11.5–14.5)
WBC: 6.8 10*3/uL (ref 3.6–11.0)

## 2015-09-25 LAB — BASIC METABOLIC PANEL
ANION GAP: 7 (ref 5–15)
BUN: 16 mg/dL (ref 6–20)
CALCIUM: 9.5 mg/dL (ref 8.9–10.3)
CO2: 23 mmol/L (ref 22–32)
CREATININE: 0.87 mg/dL (ref 0.44–1.00)
Chloride: 109 mmol/L (ref 101–111)
GFR, EST NON AFRICAN AMERICAN: 60 mL/min — AB (ref >60)
GLUCOSE: 98 mg/dL (ref 65–99)
Potassium: 3.7 mmol/L (ref 3.5–5.1)
Sodium: 139 mmol/L (ref 135–145)

## 2015-09-25 LAB — URINALYSIS COMPLETE WITH MICROSCOPIC (ARMC ONLY)
BILIRUBIN URINE: NEGATIVE
Glucose, UA: NEGATIVE mg/dL
KETONES UR: NEGATIVE mg/dL
Nitrite: NEGATIVE
PH: 5 (ref 5.0–8.0)
PROTEIN: NEGATIVE mg/dL
RBC / HPF: NONE SEEN RBC/hpf (ref 0–5)
Specific Gravity, Urine: 1.009 (ref 1.005–1.030)

## 2015-09-25 MED ORDER — CARVEDILOL 6.25 MG PO TABS
6.2500 mg | ORAL_TABLET | Freq: Two times a day (BID) | ORAL | Status: DC
  Administered 2015-09-25 – 2015-09-26 (×3): 6.25 mg via ORAL
  Filled 2015-09-25 (×4): qty 1

## 2015-09-25 MED ORDER — CLOPIDOGREL BISULFATE 75 MG PO TABS: 75.0000 mg | ORAL_TABLET | Freq: Every day | ORAL | 0 refills | Status: DC

## 2015-09-25 MED ORDER — CLOPIDOGREL BISULFATE 75 MG PO TABS
75.0000 mg | ORAL_TABLET | Freq: Every day | ORAL | 0 refills | Status: DC
Start: 2015-09-25 — End: 2015-09-25

## 2015-09-25 MED ORDER — CEPHALEXIN 500 MG PO CAPS
500.0000 mg | ORAL_CAPSULE | Freq: Three times a day (TID) | ORAL | Status: DC
  Administered 2015-09-25 – 2015-09-28 (×9): 500 mg via ORAL
  Filled 2015-09-25 (×8): qty 1

## 2015-09-25 MED ORDER — SENNOSIDES-DOCUSATE SODIUM 8.6-50 MG PO TABS
1.0000 | ORAL_TABLET | Freq: Two times a day (BID) | ORAL | Status: DC
  Administered 2015-09-25 – 2015-09-26 (×2): 1 via ORAL
  Filled 2015-09-25 (×2): qty 1

## 2015-09-25 MED ORDER — FUROSEMIDE 20 MG PO TABS
20.0000 mg | ORAL_TABLET | Freq: Once | ORAL | Status: AC
  Administered 2015-09-25: 20 mg via ORAL
  Filled 2015-09-25: qty 1

## 2015-09-25 MED ORDER — ATORVASTATIN CALCIUM 40 MG PO TABS: 40.0000 mg | ORAL_TABLET | Freq: Every day | ORAL | 0 refills | Status: DC

## 2015-09-25 MED ORDER — CARVEDILOL 6.25 MG PO TABS: 6.2500 mg | ORAL_TABLET | Freq: Two times a day (BID) | ORAL | 0 refills | Status: DC

## 2015-09-25 MED ORDER — IPRATROPIUM-ALBUTEROL 0.5-2.5 (3) MG/3ML IN SOLN
3.0000 mL | Freq: Four times a day (QID) | RESPIRATORY_TRACT | Status: DC
  Administered 2015-09-25 – 2015-09-26 (×3): 3 mL via RESPIRATORY_TRACT
  Filled 2015-09-25 (×5): qty 3

## 2015-09-25 MED ORDER — CEPHALEXIN 500 MG PO CAPS: 500.0000 mg | ORAL_CAPSULE | Freq: Three times a day (TID) | ORAL | 0 refills | Status: DC

## 2015-09-25 NOTE — Clinical Social Work Placement (Addendum)
   CLINICAL SOCIAL WORK PLACEMENT  NOTE  Date:  09/25/2015  Patient Details  Name: Julia Patterson MRN: [id number] Date of Birth: [birth year]  Clinical Social Work is seeking post-discharge placement for this patient at the [place] level of care (*CSW will initial, date and re-position this form in  chart as items are completed):  Yes   Patient/family provided with [facility] Work Department's list of facilities offering this level of care within the geographic area requested by the patient (or if unable, by the patient's family).  Yes   Patient/family informed of their freedom to choose among providers that offer the needed level of care, that participate in Medicare, Medicaid or managed care program needed by the patient, have an available bed and are willing to accept the patient.  Yes   Patient/family informed of [facility]'s ownership interest in [facility] and [facility], as well as of the fact that they are under no obligation to receive care at these facilities.  PASRR submitted to EDS on 09/25/15     PASRR number received on       Existing PASRR number confirmed on 09/25/15     FL2 transmitted to all facilities in geographic area requested by pt/family on 09/25/15     FL2 transmitted to all facilities within larger geographic area on       Patient informed that his/her managed care company has contracts with or will negotiate with certain facilities, including the following:         09-25-15   Patient/family informed of bed offers received. Evette Cristal, MSW, updated 09-29-15)  Patient chooses bed at  [facility] (Evette Cristal, MSW, updated 09-29-15)     Physician recommends and patient chooses bed at      Patient to be transferred to  [facility] on  09-29-15 Evette Cristal, MSW, updated 09-29-15).  Patient to be transferred to facility by  [facility]     Patient family notified on  09-29-15 of  transfer. Evette Cristal, MSW, updated 09-29-15)  Name of family member notified:   daughter [name] was at bedside. Evette Cristal, MSW, updated 09-29-15)     PHYSICIAN Please sign FL2     Additional Comment:    _______________________________________________ Ross Ludwig 09/25/2015, 12:55 PM  Evette Cristal, MSW, updated 09-29-15)

## 2015-09-25 NOTE — Clinical Social Work Note (Addendum)
MSW met with patient and also discussed with her daughter [name] [phone] who also spoke with patient's son in law [name], the patient's family would like patient to go to SNF for short term rehab first before she is able to go back home.  Patient is in agreement to going to SNF for rehab.  MSW to begin bed search process.  3:00pm  MSW met with patient and daughter to discuss bed offers for short term rehab at [facility].  Patient and daughter would like to go to [facility], MSW contacted SNF and they said they can accept patient over the weekend if she is medically ready for discharge and orders have been received.  MSW to continue to follow patient's progress throughout discharge planning.  [name]. Norval Morton, MSW [phone]  Mon-Fri 8a-4:30p 09/25/2015 12:47 PM

## 2015-09-25 NOTE — Telephone Encounter (Signed)
Tanya-please follow up on Monday with TCM.  Discharge note printed and on desk.  Called patient.  Left detailed message of scheduled appointment on 09/29/15 at 10:00.  Stated RN would follow up with transitional care management on 09/28/15.  Encouraged to call the office with any questions or concerns.

## 2015-09-25 NOTE — Telephone Encounter (Signed)
Noted I will follow, thanks

## 2015-09-25 NOTE — Clinical Social Work Note (Signed)
Clinical Social Work Assessment  Patient Details  Name: Julia Patterson MRN: [id number] Date of Birth: 1929/05/15  Date of referral:  09/25/15               Reason for consult:  Facility Placement                Permission sought to share information with:  Family Supports, Customer service manager Permission granted to share information::  Yes, Verbal Permission Granted  Name::     Julia Patterson [phone]   Agency::  SNF admissions  Relationship::     Contact Information:     Housing/Transportation Living arrangements for the past 2 months:  [place] of Information:  Patient Patient Interpreter Needed:  None Criminal Activity/Legal Involvement Pertinent to Current Situation/Hospitalization:  No - Comment as needed Significant Relationships:  Adult Children Lives with:  Self Do you feel safe going back to the place where you live?  No (Patient feels she needs some short term rehab before she is able to go back home.) Need for family participation in patient care:  Yes (Comment) (Patient requests to have her family involved with decision making.)  Care giving concerns: Patient feels she needs some short term rehab before she is able to go back home.   Social Worker assessment / plan:  Patient is a 82 year old female who lives in an independent living apartment [employer].  Patient is alert and oriented x4 and able to express her concerns.  MSW met with patient and discussed going to SNF for short term rehab in order to get her strength back up.  Patient states she has not been to rehab in the past, MSW explained to her what to expect.  Patient expressed that she is in agreement to going to SNF for short term rehab.  Patient was explained about what to expect at short term rehab and how insurance will pay for her stay.  Patient's son is requesting patient go to SNF as well, for short term rehab.  Patient stated  if she has to go to rehab she is in agreement to going.  Patient's family was also updated on PT recommendation for her to go to SNF for short term rehab.   Employment status:  Retired Forensic scientist:  Medicare PT Recommendations:  [place] / Referral to community resources:     Patient/Family's Response to care:  Patient in agreement to going to SNF for short term rehab. Patient/Family's Understanding of and Emotional Response to Diagnosis, Current Treatment, and Prognosis:  Patient expressed her legs feel rubbery but she feels like she will do okay once she gets some therapy.  Emotional Assessment Appearance:  Appears stated age Attitude/Demeanor/Rapport:    Affect (typically observed):  Appropriate, Calm, Pleasant Orientation:  Oriented to Self, Oriented to Place, Oriented to  Time, Oriented to Situation Alcohol / Substance use:  Not Applicable Psych involvement (Current and /or in the community):  No (Comment)  Discharge Needs  Concerns to be addressed:  Lack of Support Readmission within the last 30 days:  No Current discharge risk:  Lack of support system Barriers to Discharge:  No Barriers Identified   Ross Ludwig 09/25/2015, 12:22 PM

## 2015-09-25 NOTE — Care Management (Signed)
Clarification: patient from [facility], independent living. PT recommending STR pending progress. May qualify for home O2. Primary nurse requested to get qualifying O2 sats. RNCM and CSW following.

## 2015-09-25 NOTE — Telephone Encounter (Signed)
Noted  

## 2015-09-25 NOTE — Progress Notes (Signed)
[facility] PROGRESS NOTE  Julia Patterson [id number] DOB: [birth year] DOA: 09/23/2015 PCP: Einar Pheasant, MD  HPI/Subjective: Patient seen this morning and complains of urinary frequency. No complaints of chest pain or shortness of breath.  Objective: Vitals:   09/25/15 0930 09/25/15 1132  BP:  116/64  Pulse: 80 71  Resp:  18  Temp:  97.6 F (36.4 C)    Filed Weights   09/23/15 1216  Weight: 61.2 kg (135 lb)    ROS: Review of Systems  Constitutional: Negative for chills and fever.  Eyes: Negative for blurred vision.  Respiratory: Negative for cough and shortness of breath.   Cardiovascular: Negative for chest pain.  Gastrointestinal: Negative for abdominal pain, constipation, diarrhea, nausea and vomiting.  Genitourinary: Negative for dysuria.  Musculoskeletal: Negative for joint pain.  Neurological: Positive for weakness. Negative for dizziness and headaches.   Exam: Physical Exam  Constitutional: She is oriented to person, place, and time.  HENT:  Nose: No mucosal edema.  Mouth/Throat: No oropharyngeal exudate or posterior oropharyngeal edema.  Eyes: Conjunctivae, EOM and lids are normal. Pupils are equal, round, and reactive to light.  Neck: No JVD present. Carotid bruit is not present. No edema present. No thyroid mass and no thyromegaly present.  Cardiovascular: S1 normal and S2 normal.  Exam reveals no gallop.   Murmur heard.  Systolic murmur is present with a grade of 2/6  Pulses:      Dorsalis pedis pulses are 2+ on the right side, and 2+ on the left side.  Respiratory: No respiratory distress. She has no wheezes. She has no rhonchi. She has no rales.  GI: Soft. Bowel sounds are normal. There is no tenderness.  Musculoskeletal:       Right ankle: She exhibits swelling.       Left ankle: She exhibits swelling.  This morning left hand was wrapped in a large cast.  Lymphadenopathy:    She has no cervical adenopathy.  Neurological: She is alert  and oriented to person, place, and time. No cranial nerve deficit.  Skin: Skin is warm. No rash noted. Nails show no clubbing.  Psychiatric: She has a normal mood and affect.      Data Reviewed: Basic Metabolic Panel:  Recent Labs Lab 09/23/15 1302 09/24/15 0038 09/25/15 0517  NA 140 139 139  K 3.8 4.0 3.7  CL 105 111 109  CO2 29 26 23   GLUCOSE 100* 105* 98  BUN 21* 20 16  CREATININE 1.18* 0.99 0.87  CALCIUM 9.6 8.7* 9.5   Liver Function Tests:  Recent Labs Lab 09/23/15 1302  AST 30  ALT 12*  ALKPHOS 44  BILITOT 0.5  PROT 6.5  ALBUMIN 3.8   CBC:  Recent Labs Lab 09/23/15 1302 09/24/15 0038 09/25/15 0517  WBC 8.3 6.5 6.8  NEUTROABS 6.0  --   --   HGB 14.3 13.2 13.7  HCT 40.8 37.8 39.0  MCV 93.5 93.1 92.3  PLT 200 168 188   Cardiac Enzymes:  Recent Labs Lab 09/23/15 1302 09/23/15 1737 09/23/15 2121 09/24/15 0038  TROPONINI 1.71* 1.99* 1.87* 1.72*     Studies: Dg Chest 1 View  Result Date: 09/25/2015 CLINICAL DATA:  Hypoxia EXAM: CHEST 1 VIEW COMPARISON:  09/23/2015 FINDINGS: Cardiomegaly is noted. No infiltrate or pulmonary edema. Large hiatal hernia. Mild basilar atelectasis. Atherosclerotic calcifications of thoracic aorta. Mild thoracic dextroscoliosis. IMPRESSION: Cardiomegaly. Large hiatal hernia. Mild basilar atelectasis. No infiltrate or pulmonary edema. Electronically Signed   By: Lahoma Crocker  M.D.   On: 09/25/2015 11:01    Scheduled Meds: . acidophilus  1 capsule Oral Daily  . aspirin EC  81 mg Oral Daily  . atorvastatin  40 mg Oral q1800  . calcium citrate-vitamin D  1 tablet Oral BID  . carvedilol  6.25 mg Oral BID WC  . cephALEXin  500 mg Oral Q8H  . clopidogrel  75 mg Oral Q breakfast  . fluticasone  1 spray Each Nare Daily  . gabapentin  400 mg Oral TID  . ipratropium-albuterol  3 mL Nebulization Q6H  . levothyroxine  75 mcg Oral QAC breakfast  . Melatonin  2.5 mg Oral Daily  . oxybutynin  5 mg Oral Daily  . pantoprazole  40  mg Oral Daily  . sodium chloride flush  3 mL Intravenous Q12H  . sodium chloride flush  3 mL Intravenous Q12H    Assessment/Plan:  1. Acute myocardial infarction. Stent placed today with cardiac catheterization.  Aspirin, Plavix, atorvastatin prescribed.  2. Acute hypoxic respiratory failure. Pulse ox dropped down to 85% with ambulation. Likely due to deconditioning and large hiatal hernia in the chest. Check pulse ox tomorrow morning. 3. Weakness. We'll get patient out of rehabilitation after 3 nights stay 4. Recent left carpal tunnel release. Sutures need to breathe removed in 10 days with Dr. Earnestine Leys 5. Nausea vomiting postoperatively could've been where the event was triggered. 6. Hypothyroidism unspecified continue levothyroxine 7. GERD on Protonix  8. Hyperlipidemia unspecified. LDL 73. Changed to high intensity statin. 9. UTI. Empiric Keflex. Urine culture pending  Code Status:     Code Status Orders        Start     Ordered   09/24/15 1136  Full code  Continuous     09/24/15 1135    Code Status History    Date Active Date Inactive Code Status Order ID Comments User Context   09/23/2015  5:13 PM 09/23/2015  7:39 PM Full Code [id number]  Henreitta Leber, MD Inpatient    Advance Directive Documentation   Flowsheet Row Most Recent Value  Type of Advance Directive  Living will  Pre-existing out of facility DNR order (yellow form or pink MOST form)  No data  "MOST" Form in Place?  No data     Family Communication: Case discussed with Dr. [name] DeFrancesco And also patient's granddaughter. Disposition Plan: Hopefully out to rehabilitation tomorrow  Consultants:  Cardiology  Orthopedic surgery  Procedures:  Cardiac catheterization and stent  Time spent: 28 minutes  [place], [place]

## 2015-09-25 NOTE — Progress Notes (Signed)
Patient: Julia Patterson / Admit Date: 09/23/2015 / Date of Encounter: 09/25/2015, 7:41 AM   Subjective: No chest pain, SOB, or further nausea. Status post cardiac cath on 8/10 that showed 80% stenosis of the mid RCA s/p PCI/DES with 0% residual stenosis. There was also 20% stenosis of the proximal to mid LAD. Moderately elevated LVEDP.  She is noting dysuria, urinary frequency and urgency. She requests UA to be done. Had a negative UA upon admission. She reports frequent UTI's. Has not ambulated yet. Labs stable. BP remains elevated.   Review of Systems: Review of Systems  Constitutional: Positive for malaise/fatigue. Negative for chills, diaphoresis, fever and weight loss.  HENT: Negative for congestion.   Eyes: Negative for discharge and redness.  Respiratory: Negative for cough, hemoptysis, sputum production, shortness of breath and wheezing.   Cardiovascular: Negative for chest pain, palpitations, orthopnea, claudication, leg swelling and PND.  Gastrointestinal: Negative for abdominal pain, blood in stool, heartburn, melena, nausea and vomiting.  Genitourinary: Positive for dysuria, frequency and urgency. Negative for flank pain and hematuria.  Musculoskeletal: Negative for falls and myalgias.  Skin: Negative for rash.  Neurological: Positive for weakness. Negative for dizziness, tingling, tremors, sensory change, speech change, focal weakness and loss of consciousness.  Endo/Heme/Allergies: Does not bruise/bleed easily.  Psychiatric/Behavioral: Negative for substance abuse. The patient is not nervous/anxious.   All other systems reviewed and are negative.   Objective: Telemetry: NSR, 70's bpm Physical Exam: Blood pressure (!) 160/68, pulse 76, temperature 98.3 F (36.8 C), temperature source Oral, resp. rate 18, height 4\' 11"  (1.499 m), weight 135 lb (61.2 kg), SpO2 91 %. Body mass index is 27.27 kg/m. General: Well developed, well nourished, in no acute distress. Head:  Normocephalic, atraumatic, sclera non-icteric, no xanthomas, nares are without discharge. Neck: Negative for carotid bruits. JVP not elevated. Lungs: Clear bilaterally to auscultation without wheezes, rales, or rhonchi. Breathing is unlabored. Heart: RRR S1 S2 without murmurs, rubs, or gallops.  Abdomen: Soft, non-tender, non-distended with normoactive bowel sounds. No rebound/guarding. Extremities: No clubbing or cyanosis. No edema. Distal pedal pulses are 2+ and equal bilaterally. Cath site without bleeding, bruising, swelling, TTP, or bruit.  Neuro: Alert and oriented X 3. Moves all extremities spontaneously. Psych:  Responds to questions appropriately with a normal affect.   Intake/Output Summary (Last 24 hours) at 09/25/15 0741 Last data filed at 09/25/15 0518  Gross per 24 hour  Intake                0 ml  Output              750 ml  Net             -750 ml    Inpatient Medications:  . acidophilus  1 capsule Oral Daily  . aspirin EC  81 mg Oral Daily  . atorvastatin  40 mg Oral q1800  . calcium citrate-vitamin D  1 tablet Oral BID  . carvedilol  3.125 mg Oral BID WC  . clopidogrel  75 mg Oral Q breakfast  . fluticasone  1 spray Each Nare Daily  . gabapentin  400 mg Oral TID  . levothyroxine  75 mcg Oral QAC breakfast  . Melatonin  2.5 mg Oral Daily  . oxybutynin  5 mg Oral Daily  . pantoprazole  40 mg Oral Daily  . sodium chloride flush  3 mL Intravenous Q12H  . sodium chloride flush  3 mL Intravenous Q12H   Infusions:  Labs:  Recent Labs  09/24/15 0038 09/25/15 0517  NA 139 139  K 4.0 3.7  CL 111 109  CO2 26 23  GLUCOSE 105* 98  BUN 20 16  CREATININE 0.99 0.87  CALCIUM 8.7* 9.5    Recent Labs  09/23/15 1302  AST 30  ALT 12*  ALKPHOS 44  BILITOT 0.5  PROT 6.5  ALBUMIN 3.8    Recent Labs  09/23/15 1302 09/24/15 0038 09/25/15 0517  WBC 8.3 6.5 6.8  NEUTROABS 6.0  --   --   HGB 14.3 13.2 13.7  HCT 40.8 37.8 39.0  MCV 93.5 93.1 92.3  PLT  200 168 188    Recent Labs  09/23/15 1302 09/23/15 1737 09/23/15 2121 09/24/15 0038  TROPONINI 1.71* 1.99* 1.87* 1.72*   Invalid input(s): POCBNP No results for input(s): HGBA1C in the last 72 hours.   Weights: Filed Weights   09/23/15 1216  Weight: 135 lb (61.2 kg)     Radiology/Studies:  Dg Chest 1 View  Result Date: 09/23/2015 CLINICAL DATA:  Inability to walk. EXAM: CHEST 1 VIEW COMPARISON:  Chest CT 06/24/2014 FINDINGS: Cardiomegaly with heart size accentuated by rotation. Chronic large herniation of stomach and fat at the hiatus. Bibasilar streaky opacity correlating with scar atelectasis on the previous chest CT. There is no edema, consolidation, effusion, or pneumothorax. IMPRESSION: 1. No acute finding. 2. Large hiatal hernia with neighboring atelectasis or scar. Electronically Signed   By: Monte Fantasia M.D.   On: 09/23/2015 13:31   Ct Head Wo Contrast  Result Date: 09/23/2015 CLINICAL DATA:  82 year old female with generalize weakness. Initial encounter. EXAM: CT HEAD WITHOUT CONTRAST TECHNIQUE: Contiguous axial images were obtained from the base of the skull through the vertex without intravenous contrast. COMPARISON:  02/25/2006 CT. FINDINGS: No intracranial hemorrhage. No CT evidence of large acute infarct. Partial volume averaging with ventricle (series 2, image 16). No intracranial mass lesion noted on this unenhanced exam. Mild atrophy without hydrocephalus. Visualized mastoid air cells, middle ear cavities and paranasal sinuses are clear. No acute orbital abnormality. Aspects score: 10 IMPRESSION: No acute intracranial abnormality. Electronically Signed   By: Genia Del M.D.   On: 09/23/2015 12:50   Ct Angio Chest Pe W And/or Wo Contrast  Result Date: 09/23/2015 CLINICAL DATA:  Dyspnea, hypoxia, and elevated troponin. EXAM: CT ANGIOGRAPHY CHEST WITH CONTRAST TECHNIQUE: Multidetector CT imaging of the chest was performed using the standard protocol during bolus  administration of intravenous contrast. Multiplanar CT image reconstructions and MIPs were obtained to evaluate the vascular anatomy. CONTRAST:  60 cc Isovue 370 COMPARISON:  Chest x-ray dated 09/23/2015 and CT scan dated 06/24/2014 FINDINGS: Mediastinum/Lymph Nodes: Calcification in the thoracic aorta and in the coronary arteries and mitral valve annulus. Overall heart size is normal. RV LV ratio is normal. Lungs/Pleura: Slight chronic atelectasis at the lung bases due to huge hiatal hernia. Focal bronchiectasis at both lung bases, unchanged. Lungs are otherwise clear. No pulmonary emboli. Upper abdomen: Large hiatal hernia. Aortic atherosclerosis with stenoses of the origins of the celiac and superior mesenteric arteries. Musculoskeletal: Thoracic scoliosis and accentuated kyphosis. No acute abnormalities. Review of the MIP images confirms the above findings. IMPRESSION: No pulmonary emboli. Aortic atherosclerosis. Huge chronic hiatal hernia with chronic atelectasis and bronchiectasis at the lung bases. Electronically Signed   By: Lorriane Shire M.D.   On: 09/23/2015 15:05     Assessment and Plan  Principal Problem:   NSTEMI (non-ST elevated myocardial infarction) [facility]) Active Problems:  Nausea   Hypercholesterolemia   Weakness   CAD in native artery    1. NSTEMI: -Never with chest pain or SOB -Had persistent nausea and weakness s/p surgery, was found to have elevated troponin in the ED -Peak troponin 1.99 -Cardiac cath on 8/10 as above with 80% stenosis of the mid RCA s/p PCI/DES. Also with 20% stenosis of the proximal to mid LAD. Moderately elevated LVEDP. -Continue aspirin 81 mg and Plavix 75 mg daily -Started on Coreg 3.125 mg bid post cath, titrate -LDL 73, pravastatin changed to Lipitor -Ambulate this morning  2. HLD: -As above  3. Carotid artery disease: -Followed by vascular  4. Weakness/nausea: -Resolved -Had a fall day prior to admission, prior to this fall she does  not recall any further falls -Possibly per #1  5. Dysuria: -Patient requests repeat UA -Frequent UTI's -Defer to IM  Signed, Christell Faith, PA-C [place] Pager: [phone] 09/25/2015, 7:41 AM

## 2015-09-25 NOTE — Telephone Encounter (Signed)
Can schedule 10:00 on 09/29/15.

## 2015-09-25 NOTE — Telephone Encounter (Signed)
Is there any room or place?

## 2015-09-25 NOTE — Progress Notes (Signed)
SATURATION QUALIFICATIONS: (This note is used to comply with regulatory documentation for home oxygen)  Patient Saturations on Room Air at Rest = 91%  Patient Saturations on Room Air while Ambulating = 85%  Patient Saturations on 2 Liters of oxygen while Ambulating = 93%  Please briefly explain why patient needs home oxygen:

## 2015-09-25 NOTE — NC FL2 (Addendum)
[place] LEVEL OF CARE SCREENING TOOL     IDENTIFICATION  Patient Name: Julia Patterson Birthdate: 1930-01-02 Sex: female Admission Date (Current Location): 09/23/2015  [place] and [place] Number:  Engineering geologist and Address:  [facility], [address] [place], [place] [address]      Provider Number: [id number]  Attending Physician Name and Address:  Loletha Grayer, MD  Relative Name and Phone Number:  Laney Pastor Daughter [phone] or [place] Son in law [phone]    Current Level of Care: Hospital Recommended Level of Care: [place] Prior Approval Number:    Date Approved/Denied:   PASRR Number: [id number]  Discharge Plan: SNF    Current Diagnoses: Patient Active Problem List   Diagnosis Date Noted  . Nausea 09/24/2015  . CAD in native artery   . NSTEMI (non-ST elevated myocardial infarction) ([place]) 09/23/2015  . Weakness   . Pre-op evaluation 09/11/2015  . Health care maintenance 11/02/2014  . Breathlessness on exertion 07/29/2014  . 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 Hiatal hernia 07/01/2014  . Conjunctivitis 04/13/2014  . SOB (shortness of breath) 04/13/2014  . Post-nasal drainage 03/23/2014  . Chest pain 02/12/2014  . Rash 02/12/2014  . Cardiac murmur 02/12/2014  . Skin lesion 10/12/2013  . Carotid artery disease ([place]) 10/02/2013  . Renal artery stenosis ([place]) 06/12/2013  . Left upper quadrant pain 02/20/2013  . Calculus of kidney 05/01/2012  . Bilateral inguinal hernia 05/01/2012  . 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 Essential hypertension, benign 04/25/2012  . Abdominal pain, right upper quadrant 04/25/2012  . Renal colic 0000000  . Symptoms involving urinary system 04/25/2012  . Urge incontinence 04/25/2012  . FOM (frequency of micturition) 04/25/2012  . Hypothyroidism 03/02/2012  . GERD (gastroesophageal reflux  disease) 03/02/2012  . Urinary incontinence 03/02/2012  . Osteoporosis 03/02/2012  . Degenerative arthritis 03/02/2012  . Hypercholesterolemia 02/28/2012  . Bladder infection, chronic 11/23/2011  . Incomplete bladder emptying 11/23/2011  . LBP (low back pain) 11/23/2011  . Mixed incontinence 11/23/2011  . Atrophic vaginitis 11/23/2011  . Infection of urinary tract 11/23/2011  . Cystocele, midline 11/23/2011    Orientation RESPIRATION BLADDER Height & Weight     Self, Time, Place, Situation   (2.5 L) Continent Weight: 135 lb (61.2 kg) Height:  4\' 11"  (149.9 cm)  BEHAVIORAL SYMPTOMS/MOOD NEUROLOGICAL BOWEL NUTRITION STATUS      Continent Diet (Cardiac Diet)  AMBULATORY STATUS COMMUNICATION OF NEEDS Skin   Limited Assist Verbally Normal                       Personal Care Assistance Level of Assistance  Bathing, Dressing, Feeding Bathing Assistance: Limited assistance Feeding assistance: Independent Dressing Assistance: Limited assistance     Functional Limitations Info  Sight, Speech, Hearing Sight Info: Adequate Hearing Info: Adequate Speech Info: Adequate    SPECIAL CARE FACTORS FREQUENCY  PT (By licensed PT)     PT Frequency: 5x a week              Contractures Contractures Info: Not present    Additional Factors Info  Allergies, Code Status Code Status Info: Full Code Allergies Info: Thimerosal           Discharge Medications     Medication List    STOP taking these medications   HYDROcodone-acetaminophen 5-325 MG tablet Commonly known as:  NORCO  meloxicam 7.5 MG tablet Commonly known as:  MOBIC  pravastatin 20 MG tablet Commonly known as:  PRAVACHOL    TAKE these medications   acidophilus Caps capsule Take 1 capsule by mouth daily.  aspirin EC 81 MG tablet Take by mouth.  atorvastatin 40 MG tablet Commonly known as:  LIPITOR Take 1 tablet (40 mg total) by mouth daily at 6 PM.  CALCIUM CITRATE + D 250-200 MG-UNIT  Tabs Generic drug:  Calcium Citrate-Vitamin D Take 1 tablet by mouth 2 (two) times daily.  carvedilol 6.25 MG tablet Commonly known as:  COREG Take 1 tablet (6.25 mg total) by mouth 2 (two) times daily with a meal.  cephALEXin 500 MG capsule Commonly known as:  KEFLEX Take 1 capsule (500 mg total) by mouth every 8 (eight) hours.  clopidogrel 75 MG tablet Commonly known as:  PLAVIX Take 1 tablet (75 mg total) by mouth daily with breakfast.  Coenzyme Q10 100 MG capsule Take 1 or 2 times daily as needed  fluticasone 50 MCG/ACT nasal spray Commonly known as:  FLONASE Place 1 spray into both nostrils daily.  gabapentin 400 MG capsule Commonly known as:  NEURONTIN Take 1 capsule (400 mg total) by mouth 3 (three) times daily.  levothyroxine 75 MCG tablet Commonly known as:  SYNTHROID, LEVOTHROID TAKE 1 TABLET EVERY DAY  Melatonin 3 MG Tabs Take 3 mg by mouth daily.  MULTI-VITAMINS Tabs Take by mouth.  omeprazole 20 MG capsule Commonly known as:  PRILOSEC TAKE 1 CAPSULE EVERY DAY  ondansetron 4 MG tablet Commonly known as:  ZOFRAN Take 4 mg by mouth every 8 (eight) hours as needed for nausea or vomiting.  oxybutynin 5 MG tablet Commonly known as:  DITROPAN TAKE 1 TABLET TWICE DAILY What changed:  See the new instructions.  senna-docusate 8.6-50 MG tablet Commonly known as:  Senokot-S Take 1 tablet by mouth 2 (two) times daily.  VITAMIN D-1000 MAX ST 1000 units tablet Generic drug:  Cholecalciferol Take by mouth.   Discharge Medications     Medication List    STOP taking these medications   HYDROcodone-acetaminophen 5-325 MG tablet Commonly known as:  NORCO  meloxicam 7.5 MG tablet Commonly known as:  MOBIC  pravastatin 20 MG tablet Commonly known as:  PRAVACHOL    TAKE these medications   acidophilus Caps capsule Take 1 capsule by mouth daily.  aspirin EC 81 MG tablet Take by mouth.  atorvastatin 40 MG tablet Commonly known as:  LIPITOR Take 1 tablet (40 mg  total) by mouth daily at 6 PM.  CALCIUM CITRATE + D 250-200 MG-UNIT Tabs Generic drug:  Calcium Citrate-Vitamin D Take 1 tablet by mouth 2 (two) times daily.  carvedilol 6.25 MG tablet Commonly known as:  COREG Take 1 tablet (6.25 mg total) by mouth 2 (two) times daily with a meal.  cephALEXin 500 MG capsule Commonly known as:  KEFLEX Take 1 capsule (500 mg total) by mouth every 8 (eight) hours.  clopidogrel 75 MG tablet Commonly known as:  PLAVIX Take 1 tablet (75 mg total) by mouth daily with breakfast.  Coenzyme Q10 100 MG capsule Take 1 or 2 times daily as needed  fluticasone 50 MCG/ACT nasal spray Commonly known as:  FLONASE Place 1 spray into both nostrils daily.  gabapentin 400 MG capsule Commonly known as:  NEURONTIN Take 1 capsule (400 mg total) by mouth 3 (three) times daily.  levothyroxine 75 MCG tablet Commonly known as:  SYNTHROID, LEVOTHROID TAKE 1 TABLET EVERY DAY  Melatonin 3 MG Tabs Take 3 mg by mouth  daily.  MULTI-VITAMINS Tabs Take by mouth.  omeprazole 20 MG capsule Commonly known as:  PRILOSEC TAKE 1 CAPSULE EVERY DAY  ondansetron 4 MG tablet Commonly known as:  ZOFRAN Take 4 mg by mouth every 8 (eight) hours as needed for nausea or vomiting.  oxybutynin 5 MG tablet Commonly known as:  DITROPAN TAKE 1 TABLET TWICE DAILY What changed:  See the new instructions.  senna-docusate 8.6-50 MG tablet Commonly known as:  Senokot-S Take 1 tablet by mouth 2 (two) times daily.  VITAMIN D-1000 MAX ST 1000 units tablet Generic drug:  Cholecalciferol Take by mouth.     Discharge Medications: Please see discharge summary for a list of discharge medications.  Relevant Imaging Results:  Relevant Lab Results:   Additional Information SSN [id number]  Ross Ludwig

## 2015-09-25 NOTE — Telephone Encounter (Signed)
Julisa from [facility] called to make an  hosp f/u appt . Patient admitted for NSTEMI  Myocardial infarction . 1 wk f/u appt.  Patient  is getting d/c today. Dr. Nicki Reaper schedule is full. Please advise where to schedule. Thanks

## 2015-09-25 NOTE — Care Management Important Message (Signed)
Important Message  Patient Details  Name: MEILYNN NOY MRN: [id number] Date of Birth: 1930/01/06   Medicare Important Message Given:  Yes    Jolly Mango, RN 09/25/2015, 8:55 AM

## 2015-09-25 NOTE — Evaluation (Signed)
Physical Therapy Evaluation Patient Details Name: Julia Patterson MRN: [id number] DOB: Aug 07, 1929 Today's Date: 09/25/2015   History of Present Illness  Pt is an 82 y.o. female presenting with weakness below waist, difficulty walking, and persistent nausea s/p L carpal tunnel release 09/21/15 (pt with 1 fall prior to admission).  Pt admitted with NSTEMI with elevated troponin and s/p cardiac cath and stent 09/24/15.  PMH includes SOB, L5-6 fusion, h/o non-exertional MI, scoliosis, urinary incontinence, hiatal hernia, htn.  Clinical Impression  Prior to admission, pt was independent ambulating without AD and navigating stairs within the facility (although she has elevator access).  Pt lives at [facility] (pt has assist for meals, cleaning, and has call button for assist as needed).  Pt appearing oriented but occasional confusion noted.  Pt initially unsteady ambulating with hand hold assist and use of SPC so trialed RW (L UE resting gently on RW to protect recent surgery) and pt demonstrates steady gait without loss of balance but distance limited (125 feet) d/t fatigue.  Pt's O2 91% on RA at rest and desaturated to 87% with ambulation (returned to 92% with a couple minute rest break on RA).  Pt would benefit from skilled PT to address noted impairments and functional limitations.  Anticipate pt would be safe to discharge home with HHPT and intermittent supervision/assist but if pt requires O2 for home (pt does not have home O2 baseline), pt would require 24/7 assist for safety d/t concerns with O2 tubing management (if pt does not have this assist available, pt would benefit from STR stay to improve balance, activity tolerance, independence with functional mobility (back to baseline), and O2 tubing management safety for safe discharge).     Follow Up Recommendations  (HHPT with intermittent supervision/assist vs 24/7 supervision/assist if requires O2 for d/c home (d/t safety concerns))     Equipment Recommendations   ((RW) pt reports already owning RW fit for her)    Recommendations for Other Services       Precautions / Restrictions Precautions Precautions: Fall Precaution Comments: s/p L carpal tunnel release (pt reports no precautions/restrictions) Restrictions Weight Bearing Restrictions: No      Mobility  Bed Mobility Overal bed mobility: Needs Assistance Bed Mobility: Supine to Sit     Supine to sit: Supervision;HOB elevated     General bed mobility comments: increased effort to perform but able to sit up without any physical assist  Transfers Overall transfer level: Needs assistance Equipment used: Rolling walker (2 wheeled);Straight cane;1 person hand held assist;None Transfers: Sit to/from Stand           General transfer comment: pt unsteady standing without UE support and with use of SPC; pt steady with use of RW  Ambulation/Gait Ambulation/Gait assistance: Min guard;Min assist Ambulation Distance (Feet):  (50 feet 1 person hand held assist; 20 feet SPC; 125 feet RW) Assistive device: Rolling walker (2 wheeled);1 person hand held assist;Straight cane   Gait velocity: mildly decreased with RW   General Gait Details: pt min assist to steady ambulating with 1 person hand held assist and SPC; trialed RW and pt steady (pt resting L UE on RW gently to protect recent surgery) without any loss of balance; limited distance d/t fatigue  Stairs            Wheelchair Mobility    Modified Rankin (Stroke Patients Only)       Balance Overall balance assessment: Needs assistance Sitting-balance support: No upper extremity supported;Feet supported Sitting balance-Leahy Scale:  Normal     Standing balance support: Bilateral upper extremity supported (on RW) Standing balance-Leahy Scale: Good Standing balance comment: unsteady and unable to safely march in place without UE support                             Pertinent  Vitals/Pain Pain Assessment: No/denies pain  No c/o pain throughout PT session. HR WFL during session.    Home Living Family/patient expects to be discharged to:: [facility]isted [facility])               Home Equipment: Gilford Rile - 2 wheels;Cane - single point      Prior Function Level of Independence: Needs assistance   Gait / Transfers Assistance Needed: Independent without AD; navigates stairs independently  ADL's / Homemaking Assistance Needed: Assist for meals and cleaning        Hand Dominance        Extremity/Trunk Assessment   Upper Extremity Assessment:  (L UE deferred s/p carpal tunnel release but AROM appear non-painful and WFL; R UE WFL)           Lower Extremity Assessment: Generalized weakness         Communication   Communication: No difficulties  Cognition Arousal/Alertness: Awake/alert Behavior During Therapy: WFL for tasks assessed/performed Overall Cognitive Status:  (Oriented x4 but appears forgetful at times)                      General Comments General comments (skin integrity, edema, etc.): Pt's granddaughter present very beginning of session but left prior to OOB mobility.  Nursing cleared pt for participation in physical therapy.  Pt agreeable to PT session.    Exercises Total Joint Exercises Ankle Circles/Pumps: AROM;Strengthening;Both;10 reps;Supine Quad Sets: AROM;Strengthening;Both;10 reps;Supine Short Arc Quad: AROM;Strengthening;Both;10 reps;Supine Heel Slides: AROM;Strengthening;Both;10 reps;Supine Hip ABduction/ADduction: AROM;Strengthening;Both;10 reps;Supine      Assessment/Plan    PT Assessment Patient needs continued PT services  PT Diagnosis Difficulty walking;Generalized weakness   PT Problem List Decreased activity tolerance;Decreased balance;Decreased mobility  PT Treatment Interventions DME instruction;Gait training;Functional mobility training;Therapeutic activities;Therapeutic exercise;Balance  training;Patient/family education   PT Goals (Current goals can be found in the Care Plan section) Acute Rehab PT Goals Patient Stated Goal: to go home PT Goal Formulation: With patient Time For Goal Achievement: 10/09/15 Potential to Achieve Goals: Good    Frequency Min 2X/week   Barriers to discharge  (possibly level of assist)      Co-evaluation               End of Session Equipment Utilized During Treatment: Gait belt;Oxygen Activity Tolerance: Patient limited by fatigue Patient left: in chair;with call bell/phone within reach;with chair alarm set Nurse Communication: Mobility status;Precautions (O2 desaturation with activity (unable to get hold of pt's primary nurse so relayed message with charge nurse))         Time: [id number] PT Time Calculation (min) (ACUTE ONLY): 34 min   Charges:   PT Evaluation $PT Eval Low Complexity: 1 Procedure PT Treatments $Gait Training: 8-22 mins $Therapeutic Exercise: 8-22 mins   PT G CodesLeitha Bleak 10-02-15, 10:28 AM Leitha Bleak, [place]

## 2015-09-26 MED ORDER — POLYETHYLENE GLYCOL 3350 17 GM/SCOOP PO POWD
1.0000 | Freq: Once | ORAL | Status: AC
  Administered 2015-09-26: 255 g via ORAL
  Filled 2015-09-26: qty 255

## 2015-09-26 MED ORDER — SENNOSIDES-DOCUSATE SODIUM 8.6-50 MG PO TABS: 1.0000 | ORAL_TABLET | Freq: Two times a day (BID) | ORAL | Status: DC

## 2015-09-26 MED ORDER — SODIUM CHLORIDE 0.9 % IV BOLUS (SEPSIS)
500.0000 mL | Freq: Once | INTRAVENOUS | Status: AC
  Administered 2015-09-26: 500 mL via INTRAVENOUS

## 2015-09-26 MED ORDER — LOPERAMIDE HCL 2 MG PO CAPS
4.0000 mg | ORAL_CAPSULE | Freq: Four times a day (QID) | ORAL | Status: DC | PRN
  Administered 2015-09-26: 4 mg via ORAL
  Filled 2015-09-26: qty 2

## 2015-09-26 MED ORDER — LACTULOSE 10 GM/15ML PO SOLN
30.0000 g | Freq: Once | ORAL | Status: AC
  Administered 2015-09-26: 30 g via ORAL
  Filled 2015-09-26: qty 60

## 2015-09-26 MED ORDER — CEPHALEXIN 500 MG PO CAPS: 500.0000 mg | ORAL_CAPSULE | Freq: Three times a day (TID) | ORAL | 0 refills | Status: DC

## 2015-09-26 MED ORDER — IPRATROPIUM-ALBUTEROL 0.5-2.5 (3) MG/3ML IN SOLN: 3.0000 mL | Freq: Four times a day (QID) | RESPIRATORY_TRACT | Status: DC | PRN

## 2015-09-26 NOTE — Clinical Social Work Note (Signed)
Patient to d/c to [facility] Room 301. Patient's contact, Beth, notified. Patient to travel via non-emergent EMS due to oxygen. CSW available for any additional needs.  Santiago Bumpers, MSW LCSW-A [phone]

## 2015-09-26 NOTE — Care Management Note (Signed)
Case Management Note  Patient Details  Name: Julia Patterson MRN: [id number] Date of Birth: 09-26-1929  Subjective/Objective:          Latest MD order is to Discharge to SNF.           Action/Plan:   Expected Discharge Date:  09/25/15               Expected Discharge Plan:     In-House Referral:     Discharge planning Services     Post Acute Care Choice:    Choice offered to:     DME Arranged:    DME Agency:     HH Arranged:    HH Agency:     Status of Service:     If discussed at [employer], dates discussed:    Additional Comments:  Melinda Pottinger A, RN 09/26/2015, 11:39 AM

## 2015-09-26 NOTE — Progress Notes (Signed)
[place] at [facility]                                                                                                                                                                                            Patient Demographics   Julia Patterson, is a 82 y.o. female, DOB - 03-04-1929, [id number]  Admit date - 09/23/2015   Admitting Physician Henreitta Leber, MD  Outpatient Primary MD for the patient is Einar Pheasant, MD   LOS - 3  Subjective:Called by nurse due to patient becoming unresponsive after sitting on the commode., Her blood pressure dropped into the 78s. Currently hard to arouse     Review of Systems:   CONSTITUTIONAL: Patient unresponsive currently   Vitals:   Vitals:   09/26/15 0233 09/26/15 0427 09/26/15 0759 09/26/15 1222  BP:  127/65 122/69 (!) 149/58  Pulse:  64 65 69  Resp:  16 14 17   Temp:  97.5 F (36.4 C) 98.2 F (36.8 C) 98.1 F (36.7 C)  TempSrc:  Oral Oral Oral  SpO2: 100% 95% 93% 92%  Weight:      Height:        Wt Readings from Last 3 Encounters:  09/23/15 61.2 kg (135 lb)  09/21/15 63 kg (139 lb)  09/15/15 63.2 kg (139 lb 4 oz)     Intake/Output Summary (Last 24 hours) at 09/26/15 1439 Last data filed at 09/26/15 1340  Gross per 24 hour  Intake              480 ml  Output              250 ml  Net              230 ml    Physical Exam:   GENERAL: Critically ill-appearing not responsive HEAD, EYES, EARS, NOSE AND THROAT: Atraumatic, normocephalic. . Pupils equal and reactive to light. Sclerae anicteric. No conjunctival injection. No oro-pharyngeal erythema.  NECK: Supple. There is no jugular venous distention. No bruits, no lymphadenopathy, no thyromegaly.  HEART: Regular rate and rhythm,. No murmurs, no rubs, no clicks.  LUNGS: Clear to auscultation bilaterally. No rales or rhonchi. No wheezes.  ABDOMEN: Soft, flat, nontender, nondistended. Has good bowel sounds. No hepatosplenomegaly  appreciated.  EXTREMITIES: No evidence of any cyanosis, clubbing, or peripheral edema.  +2 pedal and radial pulses bilaterally.  NEUROLOGIC:  Patient sleepy  SKIN: Moist and warm with no rashes appreciated.  Psych: Not anxious, depressed LN: No inguinal LN enlargement    Antibiotics   Anti-infectives    Start  Dose/Rate Route Frequency Ordered Stop   09/26/15 0000  cephALEXin (KEFLEX) 500 MG capsule     500 mg Oral Every 8 hours 09/26/15 0827 09/30/15 2359   09/25/15 1045  cephALEXin (KEFLEX) capsule 500 mg     500 mg Oral Every 8 hours 09/25/15 1031     09/25/15 0000  cephALEXin (KEFLEX) 500 MG capsule  Status:  Discontinued     500 mg Oral Every 8 hours 09/25/15 1215 09/26/15       Medications   Scheduled Meds: . acidophilus  1 capsule Oral Daily  . aspirin EC  81 mg Oral Daily  . atorvastatin  40 mg Oral q1800  . calcium citrate-vitamin D  1 tablet Oral BID  . carvedilol  6.25 mg Oral BID WC  . cephALEXin  500 mg Oral Q8H  . clopidogrel  75 mg Oral Q breakfast  . fluticasone  1 spray Each Nare Daily  . gabapentin  400 mg Oral TID  . ipratropium-albuterol  3 mL Nebulization Q6H  . levothyroxine  75 mcg Oral QAC breakfast  . Melatonin  2.5 mg Oral Daily  . oxybutynin  5 mg Oral Daily  . pantoprazole  40 mg Oral Daily  . senna-docusate  1 tablet Oral BID  . sodium chloride flush  3 mL Intravenous Q12H  . sodium chloride flush  3 mL Intravenous Q12H   Continuous Infusions:  PRN Meds:.sodium chloride, acetaminophen **OR** acetaminophen, ondansetron **OR** ondansetron (ZOFRAN) IV, sodium chloride flush   Data Review:   Micro Results Recent Results (from the past 240 hour(s))  Urine culture     Status: Abnormal (Preliminary result)   Collection Time: 09/25/15  8:53 AM  Result Value Ref Range Status   Specimen Description URINE, CLEAN CATCH  Final   Special Requests NONE  Final   Culture (A)  Final    >=100,000 COLONIES/mL GRAM NEGATIVE RODS CULTURE REINCUBATED  FOR BETTER GROWTH Performed at [facility]    Report Status PENDING  Incomplete    Radiology Reports Dg Chest 1 View  Result Date: 09/25/2015 CLINICAL DATA:  Hypoxia EXAM: CHEST 1 VIEW COMPARISON:  09/23/2015 FINDINGS: Cardiomegaly is noted. No infiltrate or pulmonary edema. Large hiatal hernia. Mild basilar atelectasis. Atherosclerotic calcifications of thoracic aorta. Mild thoracic dextroscoliosis. IMPRESSION: Cardiomegaly. Large hiatal hernia. Mild basilar atelectasis. No infiltrate or pulmonary edema. Electronically Signed   By: Lahoma Crocker M.D.   On: 09/25/2015 11:01   Dg Chest 1 View  Result Date: 09/23/2015 CLINICAL DATA:  Inability to walk. EXAM: CHEST 1 VIEW COMPARISON:  Chest CT 06/24/2014 FINDINGS: Cardiomegaly with heart size accentuated by rotation. Chronic large herniation of stomach and fat at the hiatus. Bibasilar streaky opacity correlating with scar atelectasis on the previous chest CT. There is no edema, consolidation, effusion, or pneumothorax. IMPRESSION: 1. No acute finding. 2. Large hiatal hernia with neighboring atelectasis or scar. Electronically Signed   By: Monte Fantasia M.D.   On: 09/23/2015 13:31   Ct Head Wo Contrast  Result Date: 09/23/2015 CLINICAL DATA:  82 year old female with generalize weakness. Initial encounter. EXAM: CT HEAD WITHOUT CONTRAST TECHNIQUE: Contiguous axial images were obtained from the base of the skull through the vertex without intravenous contrast. COMPARISON:  02/25/2006 CT. FINDINGS: No intracranial hemorrhage. No CT evidence of large acute infarct. Partial volume averaging with ventricle (series 2, image 16). No intracranial mass lesion noted on this unenhanced exam. Mild atrophy without hydrocephalus. Visualized mastoid air cells, middle ear cavities and paranasal sinuses  are clear. No acute orbital abnormality. Aspects score: 10 IMPRESSION: No acute intracranial abnormality. Electronically Signed   By: Genia Del M.D.   On:  09/23/2015 12:50   Ct Angio Chest Pe W And/or Wo Contrast  Result Date: 09/23/2015 CLINICAL DATA:  Dyspnea, hypoxia, and elevated troponin. EXAM: CT ANGIOGRAPHY CHEST WITH CONTRAST TECHNIQUE: Multidetector CT imaging of the chest was performed using the standard protocol during bolus administration of intravenous contrast. Multiplanar CT image reconstructions and MIPs were obtained to evaluate the vascular anatomy. CONTRAST:  60 cc Isovue 370 COMPARISON:  Chest x-ray dated 09/23/2015 and CT scan dated 06/24/2014 FINDINGS: Mediastinum/Lymph Nodes: Calcification in the thoracic aorta and in the coronary arteries and mitral valve annulus. Overall heart size is normal. RV LV ratio is normal. Lungs/Pleura: Slight chronic atelectasis at the lung bases due to huge hiatal hernia. Focal bronchiectasis at both lung bases, unchanged. Lungs are otherwise clear. No pulmonary emboli. Upper abdomen: Large hiatal hernia. Aortic atherosclerosis with stenoses of the origins of the celiac and superior mesenteric arteries. Musculoskeletal: Thoracic scoliosis and accentuated kyphosis. No acute abnormalities. Review of the MIP images confirms the above findings. IMPRESSION: No pulmonary emboli. Aortic atherosclerosis. Huge chronic hiatal hernia with chronic atelectasis and bronchiectasis at the lung bases. Electronically Signed   By: Lorriane Shire M.D.   On: 09/23/2015 15:05     CBC  Recent Labs Lab 09/23/15 1302 09/24/15 0038 09/25/15 0517  WBC 8.3 6.5 6.8  HGB 14.3 13.2 13.7  HCT 40.8 37.8 39.0  PLT 200 168 188  MCV 93.5 93.1 92.3  MCH 32.8 32.4 32.3  MCHC 35.1 34.8 35.0  RDW 13.6 13.4 13.2  LYMPHSABS 1.5  --   --   MONOABS 0.6  --   --   EOSABS 0.1  --   --   BASOSABS 0.1  --   --     Chemistries   Recent Labs Lab 09/23/15 1302 09/24/15 0038 09/25/15 0517  NA 140 139 139  K 3.8 4.0 3.7  CL 105 111 109  CO2 29 26 23   GLUCOSE 100* 105* 98  BUN 21* 20 16  CREATININE 1.18* 0.99 0.87  CALCIUM 9.6  8.7* 9.5  AST 30  --   --   ALT 12*  --   --   ALKPHOS 44  --   --   BILITOT 0.5  --   --    ------------------------------------------------------------------------------------------------------------------ estimated creatinine clearance is 39 mL/min (by C-G formula based on SCr of 0.87 mg/dL). ------------------------------------------------------------------------------------------------------------------ No results for input(s): HGBA1C in the last 72 hours. ------------------------------------------------------------------------------------------------------------------  Recent Labs  09/24/15 0843  CHOL 127  HDL 32*  LDLCALC 73  TRIG 111  CHOLHDL 4.0   ------------------------------------------------------------------------------------------------------------------ No results for input(s): TSH, T4TOTAL, T3FREE, THYROIDAB in the last 72 hours.  Invalid input(s): FREET3 ------------------------------------------------------------------------------------------------------------------ No results for input(s): VITAMINB12, FOLATE, FERRITIN, TIBC, IRON, RETICCTPCT in the last 72 hours.  Coagulation profile  Recent Labs Lab 09/23/15 1302  INR 1.10    No results for input(s): DDIMER in the last 72 hours.  Cardiac Enzymes  Recent Labs Lab 09/23/15 1737 09/23/15 2121 09/24/15 0038  TROPONINI 1.99* 1.87* 1.72*   ------------------------------------------------------------------------------------------------------------------ Invalid input(s): POCBNP    Assessment & Plan   Patient is a 82 year old who was about to be discharged now had episode of unresponsiveness  1. Decreased responsiveness: Suspect this is due to acute vasovagal reaction I have given patient normal saline bolus she seems to be responding to that I will  cancel her discharge Her daughter at bedside I have updated her regarding her condition         Code Status Orders        Start      Ordered   09/24/15 1136  Full code  Continuous     09/24/15 1135    Code Status History    Date Active Date Inactive Code Status Order ID Comments User Context   09/23/2015  5:13 PM 09/23/2015  7:39 PM Full Code [id number]  Henreitta Leber, MD Inpatient    Advance Directive Documentation   Flowsheet Row Most Recent Value  Type of Advance Directive  Living will  Pre-existing out of facility DNR order (yellow form or pink MOST form)  No data  "MOST" Form in Place?  No data               Lab Results  Component Value Date   PLT 188 09/25/2015     Time Spent in minutes   24min critical care time spent     Dustin Flock M.D on 09/26/2015 at 2:39 PM  Between 7am to 6pm - Pager - [phone]  After 6pm go to [email or web address] - password [email or web address] [facility]   Office  [phone]

## 2015-09-26 NOTE — Discharge Instructions (Signed)
° °  DIET:  Cardiac diet  DISCHARGE CONDITION:  Stable  ACTIVITY:  Activity as tolerated  OXYGEN:  Home Oxygen: Yes.     Oxygen Delivery: 2 liters/min via Patient connected to nasal cannula oxygen  DISCHARGE LOCATION:  nursing home    ADDITIONAL DISCHARGE INSTRUCTION:Stitches from recent surgery come out 10 days after procedure as per Dr Sabra Heck Incentive spriometry while awake   If you experience worsening of your admission symptoms, develop shortness of breath, life threatening emergency, suicidal or homicidal thoughts you must seek medical attention immediately by calling 911 or calling your MD immediately  if symptoms less severe.  You Must read complete instructions/literature along with all the possible adverse reactions/side effects for all the Medicines you take and that have been prescribed to you. Take any new Medicines after you have completely understood and accpet all the possible adverse reactions/side effects.   Please note  You were cared for by a hospitalist during your hospital stay. If you have any questions about your discharge medications or the care you received while you were in the hospital after you are discharged, you can call the unit and asked to speak with the hospitalist on call if the hospitalist that took care of you is not available. Once you are discharged, your primary care physician will handle any further medical issues. Please note that NO REFILLS for any discharge medications will be authorized once you are discharged, as it is imperative that you return to your primary care physician (or establish a relationship with a primary care physician if you do not have one) for your aftercare needs so that they can reassess your need for medications and monitor your lab values.  Stitches from recent surgery come out 10 days after procedure as per Dr Sabra Heck

## 2015-09-26 NOTE — Progress Notes (Signed)
Orders to d/c pt to SNF post BM. Miralax in gatorade and lactulose given. Pt has since has multiple episodes of BM. Report called to Amy at [facility]. EMS called for transport. IV and tele removed and pt just awaiting EMS for transport.

## 2015-09-26 NOTE — Clinical Social Work Placement (Signed)
   CLINICAL SOCIAL WORK PLACEMENT  NOTE  Date:  09/26/2015  Patient Details  Name: Julia Patterson MRN: [id number] Date of Birth: Jul 12, 1929  Clinical Social Work is seeking post-discharge placement for this patient at the [place] level of care (*CSW will initial, date and re-position this form in  chart as items are completed):  Yes   Patient/family provided with [place] Work Department's list of facilities offering this level of care within the geographic area requested by the patient (or if unable, by the patient's family).  Yes   Patient/family informed of their freedom to choose among providers that offer the needed level of care, that participate in Medicare, Medicaid or managed care program needed by the patient, have an available bed and are willing to accept the patient.  Yes   Patient/family informed of [facility]'s ownership interest in [facility] and [facility], as well as of the fact that they are under no obligation to receive care at these facilities.  PASRR submitted to EDS on 09/25/15     PASRR number received on       Existing PASRR number confirmed on 09/25/15     FL2 transmitted to all facilities in geographic area requested by pt/family on 09/25/15     FL2 transmitted to all facilities within larger geographic area on       Patient informed that his/her managed care company has contracts with or will negotiate with certain facilities, including the following:        Yes   Patient/family informed of bed offers received.  Patient chooses bed at  [facility] )     Physician recommends and patient chooses bed at      Patient to be transferred to  [facility] on 09/26/15.  Patient to be transferred to facility by  [facility] )     Patient family notified on 09/26/15 of transfer.  Name of family member notified:   (Patient's daughter [name] is aware of D/C today. )     PHYSICIAN        Additional Comment:    _______________________________________________ Ambers Iyengar, [place] Beets, LCSW 09/26/2015, 10:56 AM

## 2015-09-26 NOTE — Progress Notes (Signed)
    Subjective:  No CP or dyspnea. C/o generalized weakness, inability to walk  Objective:  Vital Signs in the last 24 hours: Temp:  [97.5 F (36.4 C)-98.4 F (36.9 C)] 98.1 F (36.7 C) (08/12 1222) Pulse Rate:  [64-69] 69 (08/12 1222) Resp:  [14-18] 17 (08/12 1222) BP: (122-149)/(50-69) 149/58 (08/12 1222) SpO2:  [92 %-100 %] 92 % (08/12 1222)  Intake/Output from previous day: 08/11 0701 - 08/12 0700 In: 120 [P.O.:120] Out: 550 [Urine:550]  Physical Exam: Pt is alert and oriented, elderly woman in NAD HEENT: normal Neck: JVP - normal Lungs: CTA bilaterally CV: RRR without murmur or gallop Abd: soft, NT Ext: no C/C/E Skin: warm/dry no rash   Lab Results:  Recent Labs  09/24/15 0038 09/25/15 0517  WBC 6.5 6.8  HGB 13.2 13.7  PLT 168 188    Recent Labs  09/24/15 0038 09/25/15 0517  NA 139 139  K 4.0 3.7  CL 111 109  CO2 26 23  GLUCOSE 105* 98  BUN 20 16  CREATININE 0.99 0.87    Recent Labs  09/23/15 2121 09/24/15 0038  TROPONINI 1.87* 1.72*   2D Echo: Study Conclusions  - Left ventricle: The cavity size was normal. There was mild   concentric hypertrophy. Systolic function was normal. The   estimated ejection fraction was in the range of 55% to 60%. Wall   motion was normal; there were no regional wall motion   abnormalities. Features are consistent with a pseudonormal left   ventricular filling pattern, with concomitant abnormal relaxation   and increased filling pressure (grade 2 diastolic dysfunction). - Mitral valve: Calcified annulus. There was moderate   regurgitation. - Left atrium: The atrium was mildly dilated. - Tricuspid valve: There was mild-moderate regurgitation. - Pulmonary arteries: Systolic pressure was mildly increased. PA   peak pressure: 35 mm Hg (S).  CT Angio Chest: IMPRESSION: No pulmonary emboli.  Aortic atherosclerosis.  Huge chronic hiatal hernia with chronic atelectasis and bronchiectasis at the lung  bases.  Assessment/Plan:  NSTEMI: cath, echo, CTA studies reviewed. Pt s/p PCI of the mid-RCA with a DES. Her daughter is at the bedside. We discussed the importance of medication adherence with dual antiplatelet therapy. The patient will be discharged on aspirin and clopidogrel. She is on a high intensity statin drug with atorvastatin 40 mg. She is on carvedilol. Will arrange hospital follow-up in approximately 2 weeks.  Sherren Mocha, M.D. 09/26/2015, 1:03 PM

## 2015-09-26 NOTE — Progress Notes (Signed)
Around 130pm RN notified of pt feeling light headed. When RN entered the room pt was sitting on bedside commode post BM with head tilted downward. Pt was not responsive to name, only when sternal rubbed. Vital signs taken and BP was 50/32. Pt immediately placed back into bed into trendelenburg position and rapid response was called. MD notified and new order for 500cc bolus of NS placed. Pt became responsive to positioning and bolus. MD came to assess and cancelled d/c. BP came up to 160/67. Once pt placed in bed felt the need for another BM and complained of stomach cramping and nausea. Small amount of emesis. Zofran given and pt states she feels much better. Nursing will continue to assess.

## 2015-09-26 NOTE — Discharge Summary (Signed)
Julia Patterson, 82 y.o., DOB 21-Feb-1929, MRN [id number]. Admission date: 09/23/2015 Discharge Date 09/26/2015 Primary MD Einar Pheasant, MD Admitting Physician Henreitta Leber, MD  Admission Diagnosis  Weakness [R53.1] Hypoxia [R09.02] Elevated troponin I level [R79.89]  Discharge Diagnosis   Principal Problem:   NSTEMI (non-ST elevated myocardial infarction) ([place])   Generalize weakness  Hypoxia due to atelectasis   Hypercholesterolemia   Constipation   Nausea   CAD in native artery   GERD   Recent left carpal tunnel release       Hospital Course patient is a 82 year old female with known history of hypertension, hyperlipidemia, hypothyroidism, previous history non-ST MI, osteoporosis, scoliosis and urinary incontinence and GERD who recently had a carpal tunnel release surgery. Went home was doing well until she developed some weakness and difficulty with walking. Patient underwent a CT scan of the head which was benign. CT scan for chest showed no pulmonary embolism. She was noted to have a troponin of 1.7 therefore she was admitted for further evaluation for non-ST MI. For non-ST MI she was seen by cardiology and underwent a cardiac catheterization:  Following was found on the cardiac catheter  Prox LAD to Mid LAD lesion, 20 %stenosed.  Prox RCA lesion, 30 %stenosed.  A STENT RESOLUTE INTEG [id number] drug eluting stent was successfully placed, and does not overlap previously placed stent.  Mid RCA lesion, 80 %stenosed.  Post intervention, there is a 0% residual stenosis.  LV end diastolic pressure is moderately elevated  Patient underwentSuccessful angioplasty and drug-eluting stent placement to the midright coronary artery. She is currently feeling very weak and deconditioned. Was seen by physical therapy who recommended rehabilitation. Patient also was noted to have a UTI which is currently being treated. Due to her weakness she is in need of rehabilitation. Patient  also had a hypoxia with ambulation chest x-ray revealed atelectasis therefore she will need incentive spirometry use and pulmonary toileting. She will be discharged on oxygen therapy.            Consults  cardiology  Significant Tests:  See full reports for all details     Dg Chest 1 View  Result Date: 09/25/2015 CLINICAL DATA:  Hypoxia EXAM: CHEST 1 VIEW COMPARISON:  09/23/2015 FINDINGS: Cardiomegaly is noted. No infiltrate or pulmonary edema. Large hiatal hernia. Mild basilar atelectasis. Atherosclerotic calcifications of thoracic aorta. Mild thoracic dextroscoliosis. IMPRESSION: Cardiomegaly. Large hiatal hernia. Mild basilar atelectasis. No infiltrate or pulmonary edema. Electronically Signed   By: Lahoma Crocker M.D.   On: 09/25/2015 11:01   Dg Chest 1 View  Result Date: 09/23/2015 CLINICAL DATA:  Inability to walk. EXAM: CHEST 1 VIEW COMPARISON:  Chest CT 06/24/2014 FINDINGS: Cardiomegaly with heart size accentuated by rotation. Chronic large herniation of stomach and fat at the hiatus. Bibasilar streaky opacity correlating with scar atelectasis on the previous chest CT. There is no edema, consolidation, effusion, or pneumothorax. IMPRESSION: 1. No acute finding. 2. Large hiatal hernia with neighboring atelectasis or scar. Electronically Signed   By: Monte Fantasia M.D.   On: 09/23/2015 13:31   Ct Head Wo Contrast  Result Date: 09/23/2015 CLINICAL DATA:  82 year old female with generalize weakness. Initial encounter. EXAM: CT HEAD WITHOUT CONTRAST TECHNIQUE: Contiguous axial images were obtained from the base of the skull through the vertex without intravenous contrast. COMPARISON:  02/25/2006 CT. FINDINGS: No intracranial hemorrhage. No CT evidence of large acute infarct. Partial volume averaging with ventricle (series 2, image 16). No intracranial mass lesion noted on this  unenhanced exam. Mild atrophy without hydrocephalus. Visualized mastoid air cells, middle ear cavities and paranasal  sinuses are clear. No acute orbital abnormality. Aspects score: 10 IMPRESSION: No acute intracranial abnormality. Electronically Signed   By: Genia Del M.D.   On: 09/23/2015 12:50   Ct Angio Chest Pe W And/or Wo Contrast  Result Date: 09/23/2015 CLINICAL DATA:  Dyspnea, hypoxia, and elevated troponin. EXAM: CT ANGIOGRAPHY CHEST WITH CONTRAST TECHNIQUE: Multidetector CT imaging of the chest was performed using the standard protocol during bolus administration of intravenous contrast. Multiplanar CT image reconstructions and MIPs were obtained to evaluate the vascular anatomy. CONTRAST:  60 cc Isovue 370 COMPARISON:  Chest x-ray dated 09/23/2015 and CT scan dated 06/24/2014 FINDINGS: Mediastinum/Lymph Nodes: Calcification in the thoracic aorta and in the coronary arteries and mitral valve annulus. Overall heart size is normal. RV LV ratio is normal. Lungs/Pleura: Slight chronic atelectasis at the lung bases due to huge hiatal hernia. Focal bronchiectasis at both lung bases, unchanged. Lungs are otherwise clear. No pulmonary emboli. Upper abdomen: Large hiatal hernia. Aortic atherosclerosis with stenoses of the origins of the celiac and superior mesenteric arteries. Musculoskeletal: Thoracic scoliosis and accentuated kyphosis. No acute abnormalities. Review of the MIP images confirms the above findings. IMPRESSION: No pulmonary emboli. Aortic atherosclerosis. Huge chronic hiatal hernia with chronic atelectasis and bronchiectasis at the lung bases. Electronically Signed   By: Lorriane Shire M.D.   On: 09/23/2015 15:05       Today   Subjective:   [name]  feeling better denies any chest pain or shortness of breath still requiring oxygen  Objective:   Blood pressure 122/69, pulse 65, temperature 98.2 F (36.8 C), temperature source Oral, resp. rate 14, height 4\' 11"  (1.499 m), weight 61.2 kg (135 lb), SpO2 93 %.  .  Intake/Output Summary (Last 24 hours) at 09/26/15 0830 Last data filed at  09/26/15 0538  Gross per 24 hour  Intake              120 ml  Output              550 ml  Net             -430 ml    Exam VITAL SIGNS: Blood pressure 122/69, pulse 65, temperature 98.2 F (36.8 C), temperature source Oral, resp. rate 14, height 4\' 11"  (1.499 m), weight 61.2 kg (135 lb), SpO2 93 %.  GENERAL:  82 y.o.-year-old patient lying in the bed with no acute distress.  EYES: Pupils equal, round, reactive to light and accommodation. No scleral icterus. Extraocular muscles intact.  HEENT: Head atraumatic, normocephalic. Oropharynx and nasopharynx clear.  NECK:  Supple, no jugular venous distention. No thyroid enlargement, no tenderness.  LUNGS: Normal breath sounds bilaterally, no wheezing, rales,rhonchi or crepitation. No use of accessory muscles of respiration.  CARDIOVASCULAR: S1, S2 normal. No murmurs, rubs, or gallops.  ABDOMEN: Soft, nontender, nondistended. Bowel sounds present. No organomegaly or mass.  EXTREMITIES: No pedal edema, cyanosis, or clubbing.  NEUROLOGIC: Cranial nerves II through XII are intact. Muscle strength 5/5 in all extremities. Sensation intact. Gait not checked.  PSYCHIATRIC: The patient is alert and oriented x 3.  SKIN: No obvious rash, lesion, or ulcer.   Data Review     CBC w Diff: Lab Results  Component Value Date   WBC 6.8 09/25/2015   HGB 13.7 09/25/2015   HCT 39.0 09/25/2015   PLT 188 09/25/2015   LYMPHOPCT 18 09/23/2015   MONOPCT 8 09/23/2015  EOSPCT 2 09/23/2015   BASOPCT 1 09/23/2015   CMP: Lab Results  Component Value Date   NA 139 09/25/2015   K 3.7 09/25/2015   CL 109 09/25/2015   CO2 23 09/25/2015   BUN 16 09/25/2015   CREATININE 0.87 09/25/2015   PROT 6.5 09/23/2015   ALBUMIN 3.8 09/23/2015   BILITOT 0.5 09/23/2015   ALKPHOS 44 09/23/2015   AST 30 09/23/2015   ALT 12 (L) 09/23/2015  .  Micro Results No results found for this or any previous visit (from the past 240 hour(s)).      Code Status Orders         Start     Ordered   09/24/15 1136  Full code  Continuous     09/24/15 1135    Code Status History    Date Active Date Inactive Code Status Order ID Comments User Context   09/23/2015  5:13 PM 09/23/2015  7:39 PM Full Code [id number]  Henreitta Leber, MD Inpatient    Advance Directive Documentation   Flowsheet Row Most Recent Value  Type of Advance Directive  Living will  Pre-existing out of facility DNR order (yellow form or pink MOST form)  No data  "MOST" Form in Place?  No data          Follow-up Information    SCOTT, Randell Patient, MD Follow up in 1 week(s).   Specialty:  Internal Medicine Why:  Office with call you with an appt Contact information: [id number] [place] [address] [phone]        Kathlyn Sacramento, MD. Go on 10/07/2015.   Specialty:  Cardiology Why:  Hospital Follow-up: appt at 1:30pm Contact information: [place] [address] [phone]           Discharge Medications     Medication List    STOP taking these medications   HYDROcodone-acetaminophen 5-325 MG tablet Commonly known as:  NORCO   meloxicam 7.5 MG tablet Commonly known as:  MOBIC   pravastatin 20 MG tablet Commonly known as:  PRAVACHOL     TAKE these medications   acidophilus Caps capsule Take 1 capsule by mouth daily.   aspirin EC 81 MG tablet Take by mouth.   atorvastatin 40 MG tablet Commonly known as:  LIPITOR Take 1 tablet (40 mg total) by mouth daily at 6 PM.   CALCIUM CITRATE + D 250-200 MG-UNIT Tabs Generic drug:  Calcium Citrate-Vitamin D Take 1 tablet by mouth 2 (two) times daily.   carvedilol 6.25 MG tablet Commonly known as:  COREG Take 1 tablet (6.25 mg total) by mouth 2 (two) times daily with a meal.   cephALEXin 500 MG capsule Commonly known as:  KEFLEX Take 1 capsule (500 mg total) by mouth every 8 (eight) hours.   clopidogrel 75 MG tablet Commonly known as:  PLAVIX Take 1 tablet (75 mg  total) by mouth daily with breakfast.   Coenzyme Q10 100 MG capsule Take 1 or 2 times daily as needed   fluticasone 50 MCG/ACT nasal spray Commonly known as:  FLONASE Place 1 spray into both nostrils daily.   gabapentin 400 MG capsule Commonly known as:  NEURONTIN Take 1 capsule (400 mg total) by mouth 3 (three) times daily.   levothyroxine 75 MCG tablet Commonly known as:  SYNTHROID, LEVOTHROID TAKE 1 TABLET EVERY DAY   Melatonin 3 MG Tabs Take 3 mg by mouth daily.   MULTI-VITAMINS Tabs Take by  mouth.   omeprazole 20 MG capsule Commonly known as:  PRILOSEC TAKE 1 CAPSULE EVERY DAY   ondansetron 4 MG tablet Commonly known as:  ZOFRAN Take 4 mg by mouth every 8 (eight) hours as needed for nausea or vomiting.   oxybutynin 5 MG tablet Commonly known as:  DITROPAN TAKE 1 TABLET TWICE DAILY What changed:  See the new instructions.   senna-docusate 8.6-50 MG tablet Commonly known as:  Senokot-S Take 1 tablet by mouth 2 (two) times daily.   VITAMIN D-1000 MAX ST 1000 units tablet Generic drug:  Cholecalciferol Take by mouth.          Total Time in preparing paper work, data evaluation and todays exam - 35 minutes  Dustin Flock M.D on 09/26/2015 at 8:30 AM  [place]  [phone]

## 2015-09-27 DIAGNOSIS — R110 Nausea: Secondary | ICD-10-CM

## 2015-09-27 LAB — TROPONIN I
Troponin I: 0.66 ng/mL (ref <0.03)
Troponin I: 0.57 ng/mL (ref <0.03)

## 2015-09-27 LAB — GLUCOSE, CAPILLARY: GLUCOSE-CAPILLARY: 103 mg/dL — AB (ref 65–99)

## 2015-09-27 LAB — MRSA PCR SCREENING: MRSA BY PCR: NEGATIVE

## 2015-09-27 MED ORDER — NOREPINEPHRINE 4 MG/250ML-% IV SOLN: 0.0000 ug/min | INTRAVENOUS | Status: DC

## 2015-09-27 MED ORDER — SODIUM CHLORIDE 0.9 % IV SOLN
250.0000 mL | INTRAVENOUS | Status: DC | PRN
  Administered 2015-09-27: 250 mL via INTRAVENOUS
  Administered 2015-09-27: 19:00:00 via INTRAVENOUS

## 2015-09-27 MED ORDER — SODIUM CHLORIDE 0.9 % IV BOLUS (SEPSIS)
1000.0000 mL | Freq: Once | INTRAVENOUS | Status: AC
  Administered 2015-09-27: 1000 mL via INTRAVENOUS

## 2015-09-27 MED ORDER — DEXTROSE 5 % IV SOLN: 0.0000 ug/min | INTRAVENOUS | Status: DC

## 2015-09-27 MED ORDER — NYSTATIN 100000 UNIT/GM EX CREA
TOPICAL_CREAM | Freq: Two times a day (BID) | CUTANEOUS | Status: DC
  Administered 2015-09-27 (×2): via TOPICAL
  Administered 2015-09-28: 1 via TOPICAL
  Filled 2015-09-27 (×2): qty 15

## 2015-09-27 MED ORDER — MIDODRINE HCL 5 MG PO TABS
2.5000 mg | ORAL_TABLET | Freq: Three times a day (TID) | ORAL | Status: DC
  Administered 2015-09-27 – 2015-09-29 (×6): 2.5 mg via ORAL
  Filled 2015-09-27: qty 1
  Filled 2015-09-27: qty 2
  Filled 2015-09-27 (×4): qty 1

## 2015-09-27 NOTE — Progress Notes (Signed)
When first assessing the pt BP was 79/45 after being placed in trendelenburg position BP rose to 93/47. When  BP was rechecked it was 83/56. MD notiified and 235ml bolus ordered. BP dropped to 73/45 post bolus. MD made aware and order for 1L bolus placed. BP rose from 71/45 to 87/47.  MD came to assess pt and decided to send pt to step down and place on Levophed gtt. Before tx to step down BP read 89/45. Receiveing RN made aware of new onset of wheezing in right lower lobe. Pt tx to Step down with RN and NT assist.

## 2015-09-27 NOTE — Progress Notes (Signed)
Per Dr Mortimer Fries we will monitor this patient's blood pressure.  Do not start levofed or fluids at this time.

## 2015-09-27 NOTE — H&P (Addendum)
[facility]   PATIENT NAME: Julia Patterson    MR#:  [id number]  DATE OF BIRTH:  [birth year]  DATE OF ADMISSION:  09/23/2015  PRIMARY CARE PHYSICIAN: Einar Pheasant, MD   REQUESTING/REFERRING PHYSICIAN: Dr Posey Pronto  CHIEF COMPLAINT:   Chief Complaint  Patient presents with  . Weakness    HISTORY OF PRESENT ILLNESS:  Julia Patterson  is a 82 y.o. female with a known history of Hypertension, hyperlipidemia, hypothyroidism, previous history of non-ST elevation MI, osteoporosis, scoliosis, and urinary incontinence, history of carotid artery disease, GERD  -who presents to the hospital due to difficulty walking and suddenly noted to have an elevated troponin.  -Pt. Says that she had carpel Tunnel surgery on Monday and went home and was doing well until later in the day she developed some weakness and difficulty walking. - she was not able to walk and bear weight on her legs.  -n the emergency room patient underwent a CT scan of the head which was essentially benign, she also has a nonfocal neurological exam and a CT chest with contrast shows no evidence of pulmonary embolism. Incidentally she was noted to have a elevated troponin of 1.7. She denies any chest pain. Her EKG shows a left bundle branch block which is unchanged from a previous EKG in the past.  S/p cardiac cath 8/11 showed 1V disease s/p drug eluting stent Mid RCA   -patient was planned for D/c when her BP had dropped, patient states that she feels ok, patient was given 1.5L IVF's  At this time, patient states that her blood pressure is variable and it can drop well below SBP 100 at times Patient has no symptoms at this time.   Responding to IVF's, no need for vasopressors at this time No need for CVL at this time  PAST MEDICAL HISTORY:   Past Medical History:  Diagnosis Date  . CAD in native artery    a. cath 09/24/15: p-mLAD 20%, pRCA 30%, mRCA 80% s/p PCI/DES 0%, nl EF by echo, LVEDP mod  elevated  . Carotid artery occlusion   . Degenerative arthritis    lumbar spine.  spondylolisthesis, hip and leg pain, scoliosis  . Dyspnea on exertion    a. low-risk NST in 02/2014 b. echo 09/2015: EF 55% to 60%, no WMA, Grade 1 DD, mild AS, mild to moderate MS, mild MR, PA peak pressure of 45 mm Hg (S)  . GERD (gastroesophageal reflux disease)   . History of hiatal hernia   . Hypercholesterolemia   . Hypertension   . Hypothyroidism   . Nausea   . NSTEMI (non-ST elevated myocardial infarction) ([place]) 09/23/2015  . Osteoporosis    s/p fosamax, boniva, reclast, forteo.  bilateral spontaneous femur fractures  . Recurrent cystitis   . Shortness of breath dyspnea   . Urinary incontinence   . Varicose veins    chronic venous insufficiency    PAST SURGICAL HISTORY:   Past Surgical History:  Procedure Laterality Date  . ABDOMINAL HYSTERECTOMY    . BREAST REDUCTION SURGERY  1987  . CARDIAC CATHETERIZATION N/A 09/24/2015   Procedure: Left Heart Cath and Coronary Angiography;  Surgeon: Wellington Hampshire, MD;  Location: [facility];  Service: Cardiovascular;  Laterality: N/A;  . CARDIAC CATHETERIZATION N/A 09/24/2015   Procedure: Coronary Stent Intervention;  Surgeon: Wellington Hampshire, MD;  Location: [facility];  Service: Cardiovascular;  Laterality: N/A;  . CARPAL TUNNEL RELEASE Left 09/21/2015   Procedure: CARPAL TUNNEL  RELEASE;  Surgeon: Earnestine Leys, MD;  Location: [facility];  Service: Orthopedics;  Laterality: Left;  . hysterectomy and bladder repair  1998  . L5-L6 fusion  2/07  . TONSILLECTOMY AND ADENOIDECTOMY  1950  . trigger finger repair     right fourth  . uterus suspension and sterilization  1963  . varicose vein ligation  1972  . varicose vein sclerotherapy     Dr Hulda Humphrey    SOCIAL HISTORY:   Social History  Substance Use Topics  . Smoking status: Never Smoker  . Smokeless tobacco: Never Used  . Alcohol use 0.0 oz/week     Comment: rarely    FAMILY  HISTORY:   Family History  Problem Relation Age of Onset  . Rheumatic fever Mother     died age 28  . Heart attack Father   . Breast cancer Daughter   . Heart disease      father's family  . Diabetes      father's family    DRUG ALLERGIES:   Allergies  Allergen Reactions  . Thimerosal Rash    REVIEW OF SYSTEMS:   Review of Systems  Constitutional: Negative for fever and weight loss.  HENT: Negative for congestion, nosebleeds and tinnitus.   Eyes: Negative for blurred vision, double vision and redness.  Respiratory: Negative for cough, hemoptysis and shortness of breath (on exertion).   Cardiovascular: Negative for chest pain, orthopnea, leg swelling and PND.  Gastrointestinal: Negative for abdominal pain, diarrhea, melena, nausea and vomiting.  Genitourinary: Negative for dysuria, hematuria and urgency.  Musculoskeletal: Negative for falls and joint pain.  Neurological: Negative for dizziness, tingling, sensory change, focal weakness, seizures, weakness and headaches.  Endo/Heme/Allergies: Negative for polydipsia. Does not bruise/bleed easily.  Psychiatric/Behavioral: Negative for depression and memory loss. The patient is not nervous/anxious.     MEDICATIONS AT HOME:   Prior to Admission medications   Medication Sig Start Date End Date Taking? Authorizing Provider  acidophilus (RISAQUAD) CAPS capsule Take 1 capsule by mouth daily.   Yes Historical Provider, MD  aspirin EC 81 MG tablet Take by mouth.   Yes Historical Provider, MD  Calcium Citrate-Vitamin D (CALCIUM CITRATE + D) 250-200 MG-UNIT TABS Take 1 tablet by mouth 2 (two) times daily.    Yes Historical Provider, MD  Cholecalciferol (VITAMIN D-1000 MAX ST) 1000 UNITS tablet Take by mouth.   Yes Historical Provider, MD  Coenzyme Q10 100 MG capsule Take 1 or 2 times daily as needed   Yes Historical Provider, MD  fluticasone (FLONASE) 50 MCG/ACT nasal spray Place 1 spray into both nostrils daily.    Yes Historical  Provider, MD  gabapentin (NEURONTIN) 400 MG capsule Take 1 capsule (400 mg total) by mouth 3 (three) times daily. 09/21/15  Yes Earnestine Leys, MD  HYDROcodone-acetaminophen (NORCO) 5-325 MG tablet Take 1-2 tablets by mouth every 6 (six) hours as needed. 09/21/15  Yes Earnestine Leys, MD  levothyroxine (SYNTHROID, LEVOTHROID) 75 MCG tablet TAKE 1 TABLET EVERY DAY 04/17/15  Yes Einar Pheasant, MD  Melatonin 3 MG TABS Take 3 mg by mouth daily.   Yes Historical Provider, MD  meloxicam (MOBIC) 7.5 MG tablet Take 7.5 mg by mouth daily.   Yes Historical Provider, MD  Multiple Vitamin (MULTI-VITAMINS) TABS Take by mouth.   Yes Historical Provider, MD  omeprazole (PRILOSEC) 20 MG capsule TAKE 1 CAPSULE EVERY DAY 06/03/15  Yes Einar Pheasant, MD  ondansetron (ZOFRAN) 4 MG tablet Take 4 mg by mouth every 8 (eight)  hours as needed for nausea or vomiting.   Yes Historical Provider, MD  oxybutynin (DITROPAN) 5 MG tablet TAKE 1 TABLET TWICE DAILY Patient taking differently: TAKE 1 TABLET DAILY 08/31/15  Yes Shannon A McGowan, PA-C  pravastatin (PRAVACHOL) 20 MG tablet TAKE 1 TABLET EVERY DAY 08/10/15  Yes Einar Pheasant, MD      VITAL SIGNS:  Blood pressure (!) 99/42, pulse 76, temperature 97.8 F (36.6 C), temperature source Oral, resp. rate (!) 21, height 4\' 11"  (1.499 m), weight 142 lb 13.7 oz (64.8 kg), SpO2 95 %.  PHYSICAL EXAMINATION:  Physical Exam  GENERAL:  82 y.o.-year-old patient lying in the bed in no acute distress.  EYES: Pupils equal, round, reactive to light and accommodation. No scleral icterus. Extraocular muscles intact.  HEENT: Head atraumatic, normocephalic. Oropharynx and nasopharynx clear. No oropharyngeal erythema, moist oral mucosa  NECK:  Supple, no jugular venous distention. No thyroid enlargement, no tenderness.  LUNGS: Normal breath sounds bilaterally, no wheezing, rales, rhonchi. No use of accessory muscles of respiration.  CARDIOVASCULAR: S1, S2 RRR. No murmurs, rubs, gallops, clicks.   ABDOMEN: Soft, nontender, nondistended. Bowel sounds present. No organomegaly or mass.  EXTREMITIES: No pedal edema, cyanosis, or clubbing. + 2 pedal & radial pulses b/l.   NEUROLOGIC: Cranial nerves II through XII are intact. No focal Motor or sensory deficits appreciated b/l.  PSYCHIATRIC: The patient is alert and oriented x 3. Good affect.  SKIN: No obvious rash, lesion, or ulcer.   LABORATORY PANEL:   CBC  Recent Labs Lab 09/25/15 0517  WBC 6.8  HGB 13.7  HCT 39.0  PLT 188   ------------------------------------------------------------------------------------------------------------------  Chemistries   Recent Labs Lab 09/23/15 1302  09/25/15 0517  NA 140  < > 139  K 3.8  < > 3.7  CL 105  < > 109  CO2 29  < > 23  GLUCOSE 100*  < > 98  BUN 21*  < > 16  CREATININE 1.18*  < > 0.87  CALCIUM 9.6  < > 9.5  AST 30  --   --   ALT 12*  --   --   ALKPHOS 44  --   --   BILITOT 0.5  --   --   < > = values in this interval not displayed. ------------------------------------------------------------------------------------------------------------------  Cardiac Enzymes  Recent Labs Lab 09/27/15 1250  TROPONINI 0.66*   ------------------------------------------------------------------------------------------------------------------  RADIOLOGY:  No results found.   IMPRESSION AND PLAN:   82 year old female with past medical history of hypertension, hyperlipidemia, previous history of nonexertional MI, osteoporosis, scoliosis, history of GERD, carotid artery stenosis, urinary incontinence initially admitted for NSTEMI s/p sten and now with low BP-etiology unclear-probable orthostatic hypotension ?etiology  1.continue IVF's as tolerated, avoid Anti HTN for now 2.no need for vasopressors or CVL at this time 3.step down status and continue BP monitoring, recommend physical exam and UO instead of watching number on monitor    Maretta Bees Patricia Pesa, M.D.  [facility]  Medical Director [place] Director [facility] Cardio-Pulmonary Department

## 2015-09-27 NOTE — Progress Notes (Addendum)
[facility] PROGRESS NOTE  Julia Patterson [id number] DOB: [birth year] DOA: 09/23/2015 PCP: Einar Pheasant, MD  HPI/Subjective: Patient was in the process of being discharged yesterday when she had a vasovagal episode followed by hypotension Patient was reporting constipation earlier then started on bowel regimen subsequently developed loose stools which has stopped She currently is feeling weak and dizzy blood pressure still low in the 70s. She is not having any chest pain or shortness of breath  Objective: Vitals:   09/27/15 1131 09/27/15 1134  BP: (!) 83/56 (!) 73/45  Pulse: 74 75  Resp: (!) 21   Temp: 98.7 F (37.1 C)     Filed Weights   09/23/15 1216  Weight: 61.2 kg (135 lb)    ROS: Review of Systems  Constitutional: Negative for chills and fever.  Eyes: Negative for blurred vision.  Respiratory: Negative for cough and shortness of breath.   Cardiovascular: Negative for chest pain.  Gastrointestinal: Negative for abdominal pain, constipation, diarrhea, nausea and vomiting.  Genitourinary: Negative for dysuria.  Musculoskeletal: Negative for joint pain.  Neurological: Positive for dizziness and weakness. Negative for headaches.   Exam: Physical Exam  Constitutional: She is oriented to person, place, and time.  HENT:  Nose: No mucosal edema.  Mouth/Throat: No oropharyngeal exudate or posterior oropharyngeal edema.  Eyes: Conjunctivae, EOM and lids are normal. Pupils are equal, round, and reactive to light.  Neck: No JVD present. Carotid bruit is not present. No edema present. No thyroid mass and no thyromegaly present.  Cardiovascular: S1 normal and S2 normal.  Exam reveals no gallop.   Murmur heard.  Systolic murmur is present with a grade of 2/6  Pulses:      Dorsalis pedis pulses are 2+ on the right side, and 2+ on the left side.  Respiratory: No respiratory distress. She has no wheezes. She has no rhonchi. She has no rales.  GI: Soft. Bowel sounds  are normal. There is no tenderness.  Musculoskeletal:       Right ankle: She exhibits swelling.       Left ankle: She exhibits swelling.  This morning left hand was wrapped in a large cast.  Lymphadenopathy:    She has no cervical adenopathy.  Neurological: She is alert and oriented to person, place, and time. No cranial nerve deficit.  Skin: Skin is warm. No rash noted. Nails show no clubbing.  Psychiatric: She has a normal mood and affect.      Data Reviewed: Basic Metabolic Panel:  Recent Labs Lab 09/23/15 1302 09/24/15 0038 09/25/15 0517  NA 140 139 139  K 3.8 4.0 3.7  CL 105 111 109  CO2 29 26 23   GLUCOSE 100* 105* 98  BUN 21* 20 16  CREATININE 1.18* 0.99 0.87  CALCIUM 9.6 8.7* 9.5   Liver Function Tests:  Recent Labs Lab 09/23/15 1302  AST 30  ALT 12*  ALKPHOS 44  BILITOT 0.5  PROT 6.5  ALBUMIN 3.8   CBC:  Recent Labs Lab 09/23/15 1302 09/24/15 0038 09/25/15 0517  WBC 8.3 6.5 6.8  NEUTROABS 6.0  --   --   HGB 14.3 13.2 13.7  HCT 40.8 37.8 39.0  MCV 93.5 93.1 92.3  PLT 200 168 188   Cardiac Enzymes:  Recent Labs Lab 09/23/15 1302 09/23/15 1737 09/23/15 2121 09/24/15 0038  TROPONINI 1.71* 1.99* 1.87* 1.72*     Studies: No results found.  Scheduled Meds: . acidophilus  1 capsule Oral Daily  . aspirin EC  81 mg Oral Daily  . atorvastatin  40 mg Oral q1800  . calcium citrate-vitamin D  1 tablet Oral BID  . cephALEXin  500 mg Oral Q8H  . clopidogrel  75 mg Oral Q breakfast  . fluticasone  1 spray Each Nare Daily  . gabapentin  400 mg Oral TID  . levothyroxine  75 mcg Oral QAC breakfast  . Melatonin  2.5 mg Oral Daily  . nystatin cream   Topical BID  . oxybutynin  5 mg Oral Daily  . pantoprazole  40 mg Oral Daily  . sodium chloride flush  3 mL Intravenous Q12H  . sodium chloride flush  3 mL Intravenous Q12H    Assessment/Plan:  1. Acute myocardial infarction. Status post stent  Aspirin, Plavix, atorvastatin prescribed.   2. Acute hypoxic respiratory failure. Chest x-ray without any pneumonia continue incentive spirometry patient needs oxygen until to wean off 3. Hypotension continue IV fluids check a random cortisol level may need  May need midrodrin or florinef 4. Recent left carpal tunnel release. Sutures need to breathe removed in 10 days with Dr. Earnestine Leys 5. Hypothyroidism unspecified continue levothyroxine 6. GERD on Protonix  7. Hyperlipidemia unspecified. LDL 73. Changed to high intensity statin. 8. UTI. Empiric Keflex.   Code Status:     Code Status Orders        Start     Ordered   09/24/15 1136  Full code  Continuous     09/24/15 1135    Code Status History    Date Active Date Inactive Code Status Order ID Comments User Context   09/23/2015  5:13 PM 09/23/2015  7:39 PM Full Code [id number]  Henreitta Leber, MD Inpatient    Advance Directive Documentation   Flowsheet Row Most Recent Value  Type of Advance Directive  Living will  Pre-existing out of facility DNR order (yellow form or pink MOST form)  No data  "MOST" Form in Place?  No data    Disposition Plan: Hopefully out to rehabilitation tomorrow  Consultants:  Cardiology  Orthopedic surgery  Procedures:  Cardiac catheterization and stent  Time spent: 28 minutes  [place], [place]

## 2015-09-27 NOTE — Progress Notes (Signed)
[facility] Physician-Brief Progress Note Patient Name: Julia Patterson DOB: November 05, 1929 MRN: [id number]   Date of Service  09/27/2015  HPI/Events of Note  Recurrent hypotension / per notes pt reports this is a chronic problem and mentating fine/ no prob with uop   Intake/Output Summary (Last 24 hours) at 09/27/15 1854 Last data filed at 09/27/15 1800  Gross per 24 hour  Intake          1175.33 ml  Output                0 ml  Net          1175.33 ml       Lab Results  Component Value Date   HGB 13.7 09/25/2015   HGB 13.2 09/24/2015   HGB 14.3 09/23/2015     eICU Interventions  Monitor LOC/ uop for now     Intervention Category Major Interventions: Hypotension - evaluation and management  Christinia Gully 09/27/2015, 6:53 PM

## 2015-09-27 NOTE — Progress Notes (Signed)
    Subjective:  Nursing notes reviewed. Patient with episode of hypotension and near syncope yesterday. The patient states that she has a long history of "fainting spells." She has never been on antihypertensive medicine before. She has been treated with carvedilol after non-STEMI with some episodes of high blood pressure here in the hospital. She denies chest pain. Complains primarily of generalized weakness.  Objective:  Vital Signs in the last 24 hours: Temp:  [98.1 F (36.7 C)-101.8 F (38.8 C)] 98.3 F (36.8 C) (08/13 0852) Pulse Rate:  [69-84] 72 (08/13 0855) Resp:  [15-20] 15 (08/13 0852) BP: (79-149)/(45-58) 93/47 (08/13 0855) SpO2:  [91 %-93 %] 91 % (08/13 0855)  Intake/Output from previous day: 08/12 0701 - 08/13 0700 In: 1100 [P.O.:600; I.V.:500] Out: -   Physical Exam: Pt is alert and oriented, pleasant elderly woman in NAD HEENT: normal Neck: JVP - normal Lungs: CTA bilaterally CV: RRR harsh 2/6 systolic murmur at the right upper sternal border Abd: soft, NT, Positive BS, no hepatomegaly Ext: no C/C/E, distal pulses intact and equal Skin: warm/dry no rash   Lab Results:  Recent Labs  09/25/15 0517  WBC 6.8  HGB 13.7  PLT 188    Recent Labs  09/25/15 0517  NA 139  K 3.7  CL 109  CO2 23  GLUCOSE 98  BUN 16  CREATININE 0.87   No results for input(s): TROPONINI in the last 72 hours.  Invalid input(s): CK, MB  Tele: Normal sinus rhythm  Assessment/Plan:  1. Non-STEMI: Patient status post PCI. She should remain on dual antiplatelet therapy with aspirin and clopidogrel for 12 months.  2. Near syncope/hypotension: Recommend discontinuation of carvedilol. Event occurred yesterday when she was up on the bedside commode. May have been vasovagal in nature. We discussed the importance of adequate fluid hydration today. She generally appears very weak and deconditioned. Plan is noted for short-term skilled nursing for rehabilitation.   Sherren Mocha, M.D. 09/27/2015, 11:18 AM

## 2015-09-28 ENCOUNTER — Inpatient Hospital Stay: Payer: Medicare Other

## 2015-09-28 DIAGNOSIS — R778 Other specified abnormalities of plasma proteins: Secondary | ICD-10-CM

## 2015-09-28 DIAGNOSIS — N179 Acute kidney failure, unspecified: Secondary | ICD-10-CM

## 2015-09-28 DIAGNOSIS — I959 Hypotension, unspecified: Secondary | ICD-10-CM

## 2015-09-28 DIAGNOSIS — R7989 Other specified abnormal findings of blood chemistry: Secondary | ICD-10-CM

## 2015-09-28 LAB — BASIC METABOLIC PANEL
ANION GAP: 5 (ref 5–15)
BUN: 27 mg/dL — ABNORMAL HIGH (ref 6–20)
CALCIUM: 8.7 mg/dL — AB (ref 8.9–10.3)
CO2: 27 mmol/L (ref 22–32)
Chloride: 109 mmol/L (ref 101–111)
Creatinine, Ser: 1.65 mg/dL — ABNORMAL HIGH (ref 0.44–1.00)
GFR calc non Af Amer: 28 mL/min — ABNORMAL LOW (ref >60)
GFR, EST AFRICAN AMERICAN: 32 mL/min — AB (ref >60)
Glucose, Bld: 102 mg/dL — ABNORMAL HIGH (ref 65–99)
Potassium: 3.6 mmol/L (ref 3.5–5.1)
Sodium: 141 mmol/L (ref 135–145)

## 2015-09-28 LAB — CBC
HEMATOCRIT: 38.9 % (ref 35.0–47.0)
HEMOGLOBIN: 13.3 g/dL (ref 12.0–16.0)
MCH: 32.6 pg (ref 26.0–34.0)
MCHC: 34.2 g/dL (ref 32.0–36.0)
MCV: 95.1 fL (ref 80.0–100.0)
Platelets: 170 10*3/uL (ref 150–440)
RBC: 4.09 MIL/uL (ref 3.80–5.20)
RDW: 13.6 % (ref 11.5–14.5)
WBC: 8 10*3/uL (ref 3.6–11.0)

## 2015-09-28 LAB — URINE CULTURE: Culture: >=100000 — AB

## 2015-09-28 LAB — MAGNESIUM
MAGNESIUM: 1.4 mg/dL — AB (ref 1.7–2.4)
MAGNESIUM: 2.1 mg/dL (ref 1.7–2.4)

## 2015-09-28 LAB — PHOSPHORUS: PHOSPHORUS: 4.1 mg/dL (ref 2.5–4.6)

## 2015-09-28 LAB — TROPONIN I: TROPONIN I: 0.41 ng/mL — AB (ref <0.03)

## 2015-09-28 MED ORDER — CETYLPYRIDINIUM CHLORIDE 0.05 % MT LIQD
7.0000 mL | Freq: Two times a day (BID) | OROMUCOSAL | Status: DC
  Administered 2015-09-28: 7 mL via OROMUCOSAL

## 2015-09-28 MED ORDER — MAGNESIUM SULFATE 2 GM/50ML IV SOLN
2.0000 g | Freq: Once | INTRAVENOUS | Status: AC
  Administered 2015-09-28: 2 g via INTRAVENOUS
  Filled 2015-09-28: qty 50

## 2015-09-28 MED ORDER — SODIUM CHLORIDE 0.9 % IV SOLN
INTRAVENOUS | Status: DC
  Administered 2015-09-28: 04:00:00 via INTRAVENOUS

## 2015-09-28 MED ORDER — SODIUM CHLORIDE 0.9 % IV SOLN: 250.0000 mL | INTRAVENOUS | Status: DC | PRN

## 2015-09-28 MED ORDER — DIGOXIN 0.25 MG/ML IJ SOLN: 0.2500 mg | Freq: Once | INTRAMUSCULAR | Status: DC

## 2015-09-28 MED ORDER — ENOXAPARIN SODIUM 30 MG/0.3ML ~~LOC~~ SOLN
30.0000 mg | SUBCUTANEOUS | Status: DC
  Administered 2015-09-28: 30 mg via SUBCUTANEOUS
  Filled 2015-09-28: qty 0.3

## 2015-09-28 MED ORDER — CIPROFLOXACIN HCL 250 MG PO TABS
250.0000 mg | ORAL_TABLET | Freq: Every day | ORAL | Status: DC
  Administered 2015-09-28 – 2015-09-29 (×2): 250 mg via ORAL
  Filled 2015-09-28 (×2): qty 1

## 2015-09-28 NOTE — Progress Notes (Signed)
[facility] PROGRESS NOTE  Julia Patterson [id number] DOB: 1929-05-20 DOA: 09/23/2015 PCP: Einar Pheasant, MD  HPI/Subjective: Patient feeling better blood pressures improved off Levophed Renal function is slightly worse today but urine output is improved Feels much stronger  Objective: Vitals:   09/28/15 0916 09/28/15 1000  BP: 135/66 112/76  Pulse: 74 76  Resp: 17 18  Temp:      Filed Weights   09/23/15 1216 09/27/15 1500  Weight: 61.2 kg (135 lb) 64.8 kg (142 lb 13.7 oz)    ROS: Review of Systems  Constitutional: Negative for chills and fever.  Eyes: Negative for blurred vision.  Respiratory: Negative for cough and shortness of breath.   Cardiovascular: Negative for chest pain.  Gastrointestinal: Negative for abdominal pain, constipation, diarrhea, nausea and vomiting.  Genitourinary: Negative for dysuria.  Musculoskeletal: Negative for joint pain.  Neurological: Positive for weakness. Negative for dizziness and headaches.   Exam: Physical Exam  Constitutional: She is oriented to person, place, and time.  HENT:  Nose: No mucosal edema.  Mouth/Throat: No oropharyngeal exudate or posterior oropharyngeal edema.  Eyes: Conjunctivae, EOM and lids are normal. Pupils are equal, round, and reactive to light.  Neck: No JVD present. Carotid bruit is not present. No edema present. No thyroid mass and no thyromegaly present.  Cardiovascular: S1 normal and S2 normal.  Exam reveals no gallop.   Murmur heard.  Systolic murmur is present with a grade of 2/6  Pulses:      Dorsalis pedis pulses are 2+ on the right side, and 2+ on the left side.  Respiratory: No respiratory distress. She has no wheezes. She has no rhonchi. She has no rales.  GI: Soft. Bowel sounds are normal. There is no tenderness.  Musculoskeletal:       Right ankle: She exhibits swelling.       Left ankle: She exhibits swelling.  This morning left hand was wrapped in a large cast.   Lymphadenopathy:    She has no cervical adenopathy.  Neurological: She is alert and oriented to person, place, and time. No cranial nerve deficit.  Skin: Skin is warm. No rash noted. Nails show no clubbing.  Psychiatric: She has a normal mood and affect.      Data Reviewed: Basic Metabolic Panel:  Recent Labs Lab 09/23/15 1302 09/24/15 0038 09/25/15 0517 09/28/15 0338  NA 140 139 139 141  K 3.8 4.0 3.7 3.6  CL 105 111 109 109  CO2 29 26 23 27   GLUCOSE 100* 105* 98 102*  BUN 21* 20 16 27*  CREATININE 1.18* 0.99 0.87 1.65*  CALCIUM 9.6 8.7* 9.5 8.7*  MG  --   --   --  1.4*  PHOS  --   --   --  4.1   Liver Function Tests:  Recent Labs Lab 09/23/15 1302  AST 30  ALT 12*  ALKPHOS 44  BILITOT 0.5  PROT 6.5  ALBUMIN 3.8   CBC:  Recent Labs Lab 09/23/15 1302 09/24/15 0038 09/25/15 0517 09/28/15 0338  WBC 8.3 6.5 6.8 8.0  NEUTROABS 6.0  --   --   --   HGB 14.3 13.2 13.7 13.3  HCT 40.8 37.8 39.0 38.9  MCV 93.5 93.1 92.3 95.1  PLT 200 168 188 170   Cardiac Enzymes:  Recent Labs Lab 09/23/15 2121 09/24/15 0038 09/27/15 1250 09/27/15 1944 09/28/15 0338  TROPONINI 1.87* 1.72* 0.66* 0.57* 0.41*     Studies: No results found.  Scheduled Meds: .  acidophilus  1 capsule Oral Daily  . antiseptic oral rinse  7 mL Mouth Rinse BID  . aspirin EC  81 mg Oral Daily  . atorvastatin  40 mg Oral q1800  . calcium citrate-vitamin D  1 tablet Oral BID  . ciprofloxacin  250 mg Oral Q breakfast  . clopidogrel  75 mg Oral Q breakfast  . enoxaparin (LOVENOX) injection  30 mg Subcutaneous Q24H  . fluticasone  1 spray Each Nare Daily  . gabapentin  400 mg Oral TID  . levothyroxine  75 mcg Oral QAC breakfast  . Melatonin  2.5 mg Oral Daily  . midodrine  2.5 mg Oral TID WC  . nystatin cream   Topical BID  . oxybutynin  5 mg Oral Daily  . pantoprazole  40 mg Oral Daily  . sodium chloride flush  3 mL Intravenous Q12H  . sodium chloride flush  3 mL Intravenous Q12H     Assessment/Plan:  1. Acute myocardial infarction. Status post stent  Aspirin, Plavix, atorvastatin prescribed.  2. Acute hypoxic respiratory failure. Chest x-ray without any pneumonia continue incentive spirometry patient needs oxygen until to wean off 3. Hypotension Blood pressure much improved continue Midrodrin  4. Recent left carpal tunnel release. Sutures need to breathe removed in 10 days with Dr. Earnestine Leys 5. Hypothyroidism unspecified continue levothyroxine 6. GERD on Protonix  7. Hyperlipidemia unspecified. LDL 73. Changed to high intensity statin. 8. UTI. Empiric Keflex.   Code Status:     Code Status Orders        Start     Ordered   09/24/15 1136  Full code  Continuous     09/24/15 1135    Code Status History    Date Active Date Inactive Code Status Order ID Comments User Context   09/23/2015  5:13 PM 09/23/2015  7:39 PM Full Code [id number]  Henreitta Leber, MD Inpatient    Advance Directive Documentation   Flowsheet Row Most Recent Value  Type of Advance Directive  Living will  Pre-existing out of facility DNR order (yellow form or pink MOST form)  No data  "MOST" Form in Place?  No data    Disposition Plan: Hopefully out to rehabilitation tomorrow  Consultants:  Cardiology  Orthopedic surgery  Procedures:  Cardiac catheterization and stent  Time spent: 28 minutes  [place], [place]

## 2015-09-28 NOTE — Progress Notes (Signed)
Patient had 13 beat run of SVT at 0958, patient's heart rate now is 77 in NSR. Dr. Stevenson Clinch notified and acknowledged. No new orders given. Will continue to assess.

## 2015-09-28 NOTE — Plan of Care (Signed)
Problem: Education: Goal: Knowledge of [facility] Education information/materials will improve Outcome: Progressing Pt forgetful.  Alert to self and place.  Oriented to room and call light.  Discussed pain scale and methods of pain control.  Updated on health status and why she was in the CCU (low BP) and what we were currently doing to treat her (IVF).  Explained that I would be checking on her frequently and taking frequent VS during the night.  Discussed current activity status and that a bedpan would be used for toileting.  Pt verbalized understanding, but due to her current memory impairment, will require reminders and frequent status updates.  Problem: Safety: Goal: Ability to remain free from injury will improve Outcome: Progressing Pt is not attempting to get out of bed, but due to her current memory impairment, is forgetting to use call light.  Has called out verbally instead when needing assistance. Will continue to provide visual and written cues and verbal reminders of the call light being available to her and maintain the bed alarm on at all times.  Problem: Health Behavior/Discharge Planning: Goal: Ability to manage health-related needs will improve Outcome: Progressing Currently at risk for falls due to weakness and low BP.  Will seek PT consult for pt tomorrow to further evaluate gait, strength, balance and safety.  Problem: Pain Managment: Goal: General experience of comfort will improve Outcome: Progressing Complained of Rposterior hip and thigh pain to touch earlier.  After receiving neurontin, no further complaints of pain during this shift.  Will continue to monitor to see if this is an ongoing source of discomfort for her.  Problem: Physical Regulation: Goal: Ability to maintain clinical measurements within normal limits will improve Outcome: Progressing BP improving after IV fluid bolus and MIV infusion. Goal: Will remain free from infection Outcome:  Progressing Bilateral labia and perineal area excoriated and red.  Skin cleansed and thoroughly dried. Medicated cream applied as ordered.  Skin left open to air.  Pt denies pain with this excoriation.  Skin appears to be in the healing stages.  No drainage.  Bilateral groins clear.    Problem: Skin Integrity: Goal: Risk for impaired skin integrity will decrease Outcome: Progressing Yeast infection improving with frequent cleansing, leaving open to air without a brief on, and applying medicated cream.  Cont to encourage pt to turn q2h as well.  Problem: Activity: Goal: Risk for activity intolerance will decrease Outcome: Progressing Tolerating activity in bed.  Obtain PT consult tomorrow for further eval.  Problem: Fluid Volume: Goal: Ability to maintain a balanced intake and output will improve Outcome: Progressing BP improving with IV fluids.  Pt w/o s/sx fluid overload at this time.  No edema.  Lungs CTA.  No SOB.  Cont to monitor.  Problem: Nutrition: Goal: Adequate nutrition will be maintained Outcome: Progressing Tolerating meals well.  Asked for ice cream at bedtime tonight and ate 100%.  No N/V. Encourage PO fluids while awake.

## 2015-09-28 NOTE — Progress Notes (Signed)
Waiting for evaluation from physical therapy before ambulating due to weakness patient claimed to have.

## 2015-09-28 NOTE — Clinical Social Work Note (Signed)
Patient transferred to [facility] from [facility] verbal handoff given to unit CSW.   This MSW to sign off, patient still plans to go to [facility] for short term rehab.  [name]. Iktan Aikman, MSW [phone]  Mon-Fri 8a-4:30p 09/28/2015 9:11 AM

## 2015-09-28 NOTE — Progress Notes (Addendum)
Patient: Julia Patterson / Admit Date: 09/23/2015 / Date of Encounter: 09/28/2015, 8:28 AM   Subjective: Transferred to the ICU for an episode of hypotension and near syncope. Coreg has been discontinued. She has known labile hypertension with long standing history of SBP <100.  Received 1.5 L of IV fluids. Asymptomatic at this time. Troponin was checked this morning at 3:38 AM and found to be consistent with down trend from initial level.   Review of Systems: Review of Systems  Constitutional: Negative.   HENT: Negative.   Eyes: Negative.   Respiratory: Positive for shortness of breath.   Cardiovascular: Negative.   Gastrointestinal: Negative.   Genitourinary: Negative.   Musculoskeletal: Negative.   Skin: Negative.   Endo/Heme/Allergies: Negative.   Psychiatric/Behavioral: Negative.   All other systems reviewed and are negative.   Objective: Telemetry: NSR, 80's bpm Physical Exam: Blood pressure (!) 142/60, pulse 72, temperature 99 F (37.2 C), temperature source Oral, resp. rate (!) 25, height 4\' 11"  (1.499 m), weight 142 lb 13.7 oz (64.8 kg), SpO2 100 %. Body mass index is 28.85 kg/m. General: Well developed, well nourished, in no acute distress. Head: Normocephalic, atraumatic, sclera non-icteric, no xanthomas, nares are without discharge. Neck: Negative for carotid bruits. JVP not elevated. Lungs: Clear bilaterally to auscultation without wheezes, rales, or rhonchi. Breathing is unlabored. Heart: RRR S1 S2 without murmurs, rubs, or gallops.  Abdomen: Soft, non-tender, non-distended with normoactive bowel sounds. No rebound/guarding. Extremities: No clubbing or cyanosis. No edema. Distal pedal pulses are 2+ and equal bilaterally. Neuro: Alert and oriented X 3. Moves all extremities spontaneously. Psych:  Responds to questions appropriately with a normal affect.   Intake/Output Summary (Last 24 hours) at 09/28/15 0828 Last data filed at 09/28/15 0600  Gross per 24  hour  Intake             2067 ml  Output               41 ml  Net             2026 ml    Inpatient Medications:  . acidophilus  1 capsule Oral Daily  . antiseptic oral rinse  7 mL Mouth Rinse BID  . aspirin EC  81 mg Oral Daily  . atorvastatin  40 mg Oral q1800  . calcium citrate-vitamin D  1 tablet Oral BID  . cephALEXin  500 mg Oral Q8H  . clopidogrel  75 mg Oral Q breakfast  . fluticasone  1 spray Each Nare Daily  . gabapentin  400 mg Oral TID  . levothyroxine  75 mcg Oral QAC breakfast  . magnesium sulfate 1 - 4 g bolus IVPB  2 g Intravenous Once  . Melatonin  2.5 mg Oral Daily  . midodrine  2.5 mg Oral TID WC  . nystatin cream   Topical BID  . oxybutynin  5 mg Oral Daily  . pantoprazole  40 mg Oral Daily  . sodium chloride flush  3 mL Intravenous Q12H  . sodium chloride flush  3 mL Intravenous Q12H   Infusions:  . sodium chloride 100 mL/hr at 09/28/15 0422    Labs:  Recent Labs  09/28/15 0338  NA 141  K 3.6  CL 109  CO2 27  GLUCOSE 102*  BUN 27*  CREATININE 1.65*  CALCIUM 8.7*  MG 1.4*  PHOS 4.1   No results for input(s): AST, ALT, ALKPHOS, BILITOT, PROT, ALBUMIN in the last 72 hours.  Recent Labs  09/28/15 0338  WBC 8.0  HGB 13.3  HCT 38.9  MCV 95.1  PLT 170    Recent Labs  09/27/15 1250 09/27/15 1944 09/28/15 0338  TROPONINI 0.66* 0.57* 0.41*   Invalid input(s): POCBNP No results for input(s): HGBA1C in the last 72 hours.   Weights: Filed Weights   09/23/15 1216 09/27/15 1500  Weight: 135 lb (61.2 kg) 142 lb 13.7 oz (64.8 kg)     Radiology/Studies:  Dg Chest 1 View  Result Date: 09/25/2015 CLINICAL DATA:  Hypoxia EXAM: CHEST 1 VIEW COMPARISON:  09/23/2015 FINDINGS: Cardiomegaly is noted. No infiltrate or pulmonary edema. Large hiatal hernia. Mild basilar atelectasis. Atherosclerotic calcifications of thoracic aorta. Mild thoracic dextroscoliosis. IMPRESSION: Cardiomegaly. Large hiatal hernia. Mild basilar atelectasis. No infiltrate  or pulmonary edema. Electronically Signed   By: Lahoma Crocker M.D.   On: 09/25/2015 11:01   Dg Chest 1 View  Result Date: 09/23/2015 CLINICAL DATA:  Inability to walk. EXAM: CHEST 1 VIEW COMPARISON:  Chest CT 06/24/2014 FINDINGS: Cardiomegaly with heart size accentuated by rotation. Chronic large herniation of stomach and fat at the hiatus. Bibasilar streaky opacity correlating with scar atelectasis on the previous chest CT. There is no edema, consolidation, effusion, or pneumothorax. IMPRESSION: 1. No acute finding. 2. Large hiatal hernia with neighboring atelectasis or scar. Electronically Signed   By: Monte Fantasia M.D.   On: 09/23/2015 13:31   Ct Head Wo Contrast  Result Date: 09/23/2015 CLINICAL DATA:  82 year old female with generalize weakness. Initial encounter. EXAM: CT HEAD WITHOUT CONTRAST TECHNIQUE: Contiguous axial images were obtained from the base of the skull through the vertex without intravenous contrast. COMPARISON:  02/25/2006 CT. FINDINGS: No intracranial hemorrhage. No CT evidence of large acute infarct. Partial volume averaging with ventricle (series 2, image 16). No intracranial mass lesion noted on this unenhanced exam. Mild atrophy without hydrocephalus. Visualized mastoid air cells, middle ear cavities and paranasal sinuses are clear. No acute orbital abnormality. Aspects score: 10 IMPRESSION: No acute intracranial abnormality. Electronically Signed   By: Genia Del M.D.   On: 09/23/2015 12:50   Ct Angio Chest Pe W And/or Wo Contrast  Result Date: 09/23/2015 CLINICAL DATA:  Dyspnea, hypoxia, and elevated troponin. EXAM: CT ANGIOGRAPHY CHEST WITH CONTRAST TECHNIQUE: Multidetector CT imaging of the chest was performed using the standard protocol during bolus administration of intravenous contrast. Multiplanar CT image reconstructions and MIPs were obtained to evaluate the vascular anatomy. CONTRAST:  60 cc Isovue 370 COMPARISON:  Chest x-ray dated 09/23/2015 and CT scan dated  06/24/2014 FINDINGS: Mediastinum/Lymph Nodes: Calcification in the thoracic aorta and in the coronary arteries and mitral valve annulus. Overall heart size is normal. RV LV ratio is normal. Lungs/Pleura: Slight chronic atelectasis at the lung bases due to huge hiatal hernia. Focal bronchiectasis at both lung bases, unchanged. Lungs are otherwise clear. No pulmonary emboli. Upper abdomen: Large hiatal hernia. Aortic atherosclerosis with stenoses of the origins of the celiac and superior mesenteric arteries. Musculoskeletal: Thoracic scoliosis and accentuated kyphosis. No acute abnormalities. Review of the MIP images confirms the above findings. IMPRESSION: No pulmonary emboli. Aortic atherosclerosis. Huge chronic hiatal hernia with chronic atelectasis and bronchiectasis at the lung bases. Electronically Signed   By: Lorriane Shire M.D.   On: 09/23/2015 15:05     Assessment and Plan  Principal Problem:   NSTEMI (non-ST elevated myocardial infarction) ([place]) Active Problems:   Nausea   Hypercholesterolemia   Weakness   CAD in native artery    1. NSTEMI: -Status  post PCI/DES to the mid RCA on 09/24/15 -Continue DAPT with aspirin and Plavix for at least the next 12 months -Unable to tolerate beta blocker 2/2 hypotension -Would benefit from cardiac rehab  2. Near syncope/hypotension: -Possibly in the setting of Coreg vs vasovagal as she was up using the bedside commode when symptomatic -Hold Coreg  -Improved, asymptomatic -She is also weak and deconditioned  -PT -Likely need short-term skilled nursing facility   3. AKI: -Likely ATN in the setting of her hypotension -Avoid low BP -IF fluids  4. Hypomagnesemia:  -Status post repletion -Recheck level pending this morning    Signed, Marcille Blanco [place] Pager: [phone] 09/28/2015, 8:28 AM   Attending Note Patient seen and examined, agree with detailed note above,  Patient presentation and plan discussed on rounds.    Patient denies any symptoms this evening on rounds No chest pain, no shortness of breath Reports that she tried to walk with PT, legs gave way and she was not strong enough to walk  Review of the charts and on discussion with family in the room she had severe diarrhea 2 days ago, every 15 minutes. Presented with hypotension Renal function consistent with prerenal state , creatinine up to 1.6 Patient reports she is drinking more fluids today  On clinical exam, heart sounds regular, no murmur appreciated, lungs are clear, abdomen soft, no leg edema  --- Near syncope/hypotension Exacerbated by diarrhea all day Saturday, dehydration Confirmed by elevated creatinine Encouraged by mouth fluids If creatinine continues to be up tomorrow, may need gentle IV fluids Agree with holding carvedilol Would check orthostatics prior to discharge  ---cad Recent stent placed to her mid RCA for 80% lesion Would continue dual antiplatelet therapy  Details above discussed with family at the bedside   Greater than 50% was spent in counseling and coordination of care with patient Total encounter time 35 minutes or more   Signed: Esmond Plants  M.D., Ph.D. [facility]

## 2015-09-28 NOTE — Telephone Encounter (Signed)
Attempted to reach for TCM call, Left a VM to return the call asap.

## 2015-09-28 NOTE — Care Management Note (Addendum)
Case Management Note  Patient Details  Name: Julia Patterson MRN: [id number] Date of Birth: February 11, 1930  Subjective/Objective:                  This RNCM spoke with Aldona Bar at [facility] [phone]. She and [facility] (SNF) are still planning on patient going to [facility] for rehab. Patient is not in an assisted living environment- [facility] is independent living with handicap accessibility to call for help and in bathrooms. This patient was independent with mobility prior to wrist surgery. She has no DME available at home.  Her son in law is Dr. Melody Haver [phone]. Her PCP is Dr. Einar Pheasant. Per Aldona Bar with [facility] "if patient is able to return to her independent living- [facility] will send PT to her apartment ([place])". Patient does not currently have 24/7 care in the home setting. O2 is acute and I do not see a chronic lung disease dx.  Action/Plan: RNCM will continue to follow.   Expected Discharge Date:  09/25/15               Expected Discharge Plan:     In-House Referral:     Discharge planning Services  CM Consult  Post Acute Care Choice:  Durable Medical Equipment, Home Health Choice offered to:   Aldona Bar with [facility] )  DME Arranged:    DME Agency:     HH Arranged:    [place] Agency:     Status of Service:  In process, will continue to follow  If discussed at Long Length of Stay Meetings, dates discussed:    Additional Comments:  Marshell Garfinkel, RN 09/28/2015, 12:57 PM

## 2015-09-28 NOTE — H&P (Signed)
[place] [facility] Consultation   PATIENT NAME: Julia Patterson    MR#:  [id number]  DATE OF BIRTH:  29-May-1929  DATE OF ADMISSION:  09/23/2015  PRIMARY CARE PHYSICIAN: Einar Pheasant, MD   REQUESTING/REFERRING PHYSICIAN: Dr Posey Pronto  CHIEF COMPLAINT:   Chief Complaint  Patient presents with  . Weakness    HISTORY OF PRESENT ILLNESS:  Julia Patterson  is a 82 y.o. female with a known history of Hypertension, hyperlipidemia, hypothyroidism, previous history of non-ST elevation MI, osteoporosis, scoliosis, and urinary incontinence, history of carotid artery disease, GERD  -who presents to the hospital due to difficulty walking and suddenly noted to have an elevated troponin.  -Pt. Says that she had carpel Tunnel surgery on Monday and went home and was doing well until later in the day she developed some weakness and difficulty walking. - she was not able to walk and bear weight on her legs.  -n the emergency room patient underwent a CT scan of the head which was essentially benign, she also has a nonfocal neurological exam and a CT chest with contrast shows no evidence of pulmonary embolism. Incidentally she was noted to have a elevated troponin of 1.7. She denies any chest pain. Her EKG shows a left bundle branch block which is unchanged from a previous EKG in the past.  S/p cardiac cath 8/11 showed 1V disease s/p drug eluting stent Mid RCA   -patient was planned for D/c when her BP had dropped, patient states that she feels ok, patient was given 1.5L IVF's  At this time, patient states that her blood pressure is variable and it can drop well below SBP 100 at times Patient has no symptoms at this time.   Responding to IVF's, no need for vasopressors at this time No need for CVL at this time  SUBJECTIVE: Patient with good clinical improvement overnight, no significant events. Blood pressure significant only improving, renal function decreased secondary to recent  hypotension  PAST MEDICAL HISTORY:   Past Medical History:  Diagnosis Date  . CAD in native artery    a. cath 09/24/15: p-mLAD 20%, pRCA 30%, mRCA 80% s/p PCI/DES 0%, nl EF by echo, LVEDP mod elevated  . Carotid artery occlusion   . Degenerative arthritis    lumbar spine.  spondylolisthesis, hip and leg pain, scoliosis  . Dyspnea on exertion    a. low-risk NST in 02/2014 b. echo 09/2015: EF 55% to 60%, no WMA, Grade 1 DD, mild AS, mild to moderate MS, mild MR, PA peak pressure of 45 mm Hg (S)  . GERD (gastroesophageal reflux disease)   . History of hiatal hernia   . Hypercholesterolemia   . Hypertension   . Hypothyroidism   . Nausea   . NSTEMI (non-ST elevated myocardial infarction) ([place]) 09/23/2015  . Osteoporosis    s/p fosamax, boniva, reclast, forteo.  bilateral spontaneous femur fractures  . Recurrent cystitis   . Shortness of breath dyspnea   . Urinary incontinence   . Varicose veins    chronic venous insufficiency    PAST SURGICAL HISTORY:   Past Surgical History:  Procedure Laterality Date  . ABDOMINAL HYSTERECTOMY    . BREAST REDUCTION SURGERY  1987  . CARDIAC CATHETERIZATION N/A 09/24/2015   Procedure: Left Heart Cath and Coronary Angiography;  Surgeon: Wellington Hampshire, MD;  Location: [place] [facility];  Service: Cardiovascular;  Laterality: N/A;  . CARDIAC CATHETERIZATION N/A 09/24/2015   Procedure: Coronary Stent Intervention;  Surgeon: Wellington Hampshire, MD;  Location: [place] [facility];  Service: Cardiovascular;  Laterality: N/A;  . CARPAL TUNNEL RELEASE Left 09/21/2015   Procedure: CARPAL TUNNEL RELEASE;  Surgeon: Earnestine Leys, MD;  Location: [facility] ORS;  Service: Orthopedics;  Laterality: Left;  . hysterectomy and bladder repair  1998  . L5-L6 fusion  2/07  . TONSILLECTOMY AND ADENOIDECTOMY  1950  . trigger finger repair     right fourth  . uterus suspension and sterilization  1963  . varicose vein ligation  1972  . varicose vein sclerotherapy     Dr  Hulda Humphrey    SOCIAL HISTORY:   Social History  Substance Use Topics  . Smoking status: Never Smoker  . Smokeless tobacco: Never Used  . Alcohol use 0.0 oz/week     Comment: rarely    FAMILY HISTORY:   Family History  Problem Relation Age of Onset  . Rheumatic fever Mother     died age 32  . Heart attack Father   . Breast cancer Daughter   . Heart disease      father's family  . Diabetes      father's family    DRUG ALLERGIES:   Allergies  Allergen Reactions  . Thimerosal Rash    REVIEW OF SYSTEMS:   Review of Systems  Constitutional: Negative for fever and weight loss.  HENT: Negative for congestion, nosebleeds and tinnitus.   Eyes: Negative for blurred vision, double vision and redness.  Respiratory: Negative for cough, hemoptysis and shortness of breath (on exertion).   Cardiovascular: Negative for chest pain, orthopnea, leg swelling and PND.  Gastrointestinal: Negative for abdominal pain, diarrhea, melena, nausea and vomiting.  Genitourinary: Negative for dysuria, hematuria and urgency.  Musculoskeletal: Negative for falls and joint pain.  Neurological: Negative for dizziness, tingling, sensory change, focal weakness, seizures, weakness and headaches.  Endo/Heme/Allergies: Negative for polydipsia. Does not bruise/bleed easily.  Psychiatric/Behavioral: Negative for depression and memory loss. The patient is not nervous/anxious.     MEDICATIONS AT HOME:   Prior to Admission medications   Medication Sig Start Date End Date Taking? Authorizing Provider  acidophilus (RISAQUAD) CAPS capsule Take 1 capsule by mouth daily.   Yes Historical Provider, MD  aspirin EC 81 MG tablet Take by mouth.   Yes Historical Provider, MD  Calcium Citrate-Vitamin D (CALCIUM CITRATE + D) 250-200 MG-UNIT TABS Take 1 tablet by mouth 2 (two) times daily.    Yes Historical Provider, MD  Cholecalciferol (VITAMIN D-1000 MAX ST) 1000 UNITS tablet Take by mouth.   Yes Historical Provider, MD   Coenzyme Q10 100 MG capsule Take 1 or 2 times daily as needed   Yes Historical Provider, MD  fluticasone (FLONASE) 50 MCG/ACT nasal spray Place 1 spray into both nostrils daily.    Yes Historical Provider, MD  gabapentin (NEURONTIN) 400 MG capsule Take 1 capsule (400 mg total) by mouth 3 (three) times daily. 09/21/15  Yes Earnestine Leys, MD  HYDROcodone-acetaminophen (NORCO) 5-325 MG tablet Take 1-2 tablets by mouth every 6 (six) hours as needed. 09/21/15  Yes Earnestine Leys, MD  levothyroxine (SYNTHROID, LEVOTHROID) 75 MCG tablet TAKE 1 TABLET EVERY DAY 04/17/15  Yes Einar Pheasant, MD  Melatonin 3 MG TABS Take 3 mg by mouth daily.   Yes Historical Provider, MD  meloxicam (MOBIC) 7.5 MG tablet Take 7.5 mg by mouth daily.   Yes Historical Provider, MD  Multiple Vitamin (MULTI-VITAMINS) TABS Take by mouth.   Yes Historical Provider, MD  omeprazole (PRILOSEC) 20 MG capsule TAKE 1  CAPSULE EVERY DAY 06/03/15  Yes Einar Pheasant, MD  ondansetron (ZOFRAN) 4 MG tablet Take 4 mg by mouth every 8 (eight) hours as needed for nausea or vomiting.   Yes Historical Provider, MD  oxybutynin (DITROPAN) 5 MG tablet TAKE 1 TABLET TWICE DAILY Patient taking differently: TAKE 1 TABLET DAILY 08/31/15  Yes Shannon A McGowan, PA-C  pravastatin (PRAVACHOL) 20 MG tablet TAKE 1 TABLET EVERY DAY 08/10/15  Yes Einar Pheasant, MD      VITAL SIGNS:  Blood pressure (!) 142/60, pulse 72, temperature 99 F (37.2 C), temperature source Oral, resp. rate (!) 25, height 4\' 11"  (1.499 m), weight 142 lb 13.7 oz (64.8 kg), SpO2 100 %.  PHYSICAL EXAMINATION:  Physical Exam  Nursing note and vitals reviewed.   GENERAL:  82 y.o.-year-old patient lying in the bed in no acute distress.  EYES: Pupils equal, round, reactive to light and accommodation. No scleral icterus. Extraocular muscles intact.  HEENT: Head atraumatic, normocephalic. Oropharynx and nasopharynx clear. No oropharyngeal erythema, moist oral mucosa  NECK:  Supple, no jugular  venous distention. No thyroid enlargement, no tenderness.  LUNGS: Normal breath sounds bilaterally, no wheezing, rales, rhonchi. No use of accessory muscles of respiration.  CARDIOVASCULAR: S1, S2 RRR. No murmurs, rubs, gallops, clicks.  ABDOMEN: Soft, nontender, nondistended. Bowel sounds present. No organomegaly or mass.  EXTREMITIES: No pedal edema, cyanosis, or clubbing. + 2 pedal & radial pulses b/l.   NEUROLOGIC: Cranial nerves II through XII are intact. No focal Motor or sensory deficits appreciated b/l.  PSYCHIATRIC: The patient is alert and oriented x 3. Good affect.  SKIN: No obvious rash, lesion, or ulcer.   LABORATORY PANEL:   CBC  Recent Labs Lab 09/28/15 0338  WBC 8.0  HGB 13.3  HCT 38.9  PLT 170   ------------------------------------------------------------------------------------------------------------------  Chemistries   Recent Labs Lab 09/23/15 1302  09/28/15 0338  NA 140  < > 141  K 3.8  < > 3.6  CL 105  < > 109  CO2 29  < > 27  GLUCOSE 100*  < > 102*  BUN 21*  < > 27*  CREATININE 1.18*  < > 1.65*  CALCIUM 9.6  < > 8.7*  MG  --   --  1.4*  AST 30  --   --   ALT 12*  --   --   ALKPHOS 44  --   --   BILITOT 0.5  --   --   < > = values in this interval not displayed. ------------------------------------------------------------------------------------------------------------------  Cardiac Enzymes  Recent Labs Lab 09/28/15 0338  TROPONINI 0.41*   ------------------------------------------------------------------------------------------------------------------  RADIOLOGY:  No results found.   IMPRESSION AND PLAN:   82 year old female with past medical history of hypertension, hyperlipidemia, previous history of nonexertional MI, osteoporosis, scoliosis, history of GERD, carotid artery stenosis, urinary incontinence initially admitted for NSTEMI s/p sten and now with low BP-etiology unclear-probable orthostatic hypotension  ?etiology  1.continue IVF's as tolerated, avoid Anti HTN for now 2.no need for vasopressors or CVL at this time 3.transfer to tele unit and continue BP monitoring, recommend physical exam and UO instead of watching number on monitor 4. AKI secondary to hypotension - cont with IVFs as tolerated, monitor   Ok to transfer to tele unit Discussed with Dr. Dustin Flock.   Thank you for consulting [facility], we will signoff at this time.  Please feel free to contact us with any questions at [phone] (please enter 7-digits).  Vilinda Boehringer, MD [facility] Pager [phone] (please enter 7-digits) On Call Pager - [phone] (please enter 7-digits)

## 2015-09-28 NOTE — Progress Notes (Signed)
Physical Therapy Re-Evaluation Patient Details Name: Julia Patterson MRN: [id number] DOB: 1930-01-08 Today's Date: 09/28/2015    History of Present Illness Pt is an 82 y.o. female presenting with weakness below waist, difficulty walking, and persistent nausea s/p L carpal tunnel release 09/21/15 (pt with 1 fall prior to admission).  Pt admitted with NSTEMI with elevated troponin and s/p cardiac cath and stent 09/24/15.  Pt was ready for d/c 8/12 when she was found unresponsive on BSC and noted to have BP 50/32 mmHg. Pt transferred to the ICU for further evaluation. PMH includes SOB, L5-6 fusion, h/o non-exertional MI, scoliosis, urinary incontinence, hiatal hernia, htn.    PT Comments    Pt initially evaluated by PT on 8/11 and was recommended to d/c to SNF versus home with 24/7 supervision/assist and HHPT. Of note, pt able to ambulate 124ft with RW and min guard-min A x1 on evaluation, though pt has no recall of this. Pt with transfer to ICU with continue-at-transfer orders and seen today for re-evaluation.  Currently, pt is requiring mod A for supine > sit and min-mod A x2 for sit <> stand attempts at RW. Pt made 3 attempts but was unable to achieve full standing 2/2 sharp pain in her L foot just distal to the medial malleoli with any weight bearing (no pain at rest). Pt's vitals were monitored and maintained WFL throughout re-eval. Pt is significantly limited by L foot pain and will require acute PT services to address noted impairments and functional limitations. Recommend pt discharge to SNF when medically appropriate.    Follow Up Recommendations  SNF     Equipment Recommendations   (pt has RW )    Recommendations for Other Services       Precautions / Restrictions Precautions Precautions: Fall Precaution Comments: s/p L carpal tunnel release (pt reports no precautions/restrictions) Required Braces or Orthoses:  (Wrist brace in place LUE) Restrictions Weight Bearing  Restrictions: Yes LUE Weight Bearing: Weight bearing as tolerated    Mobility  Bed Mobility Overal bed mobility: Needs Assistance Bed Mobility: Supine to Sit;Sit to Supine Supine to sit: HOB elevated;Mod assist Sit to supine: Max assist;+2 for physical assistance General bed mobility comments: Mod A at trunk for supine > sit; max A x2 for sit > supine secondary to fatigue. Increased time and effort. Vc's for technique.  Transfers Overall transfer level: Needs assistance Equipment used: Rolling walker (2 wheeled) Transfers: Sit to/from Stand (x 3 trials) Sit to Stand: Min assist;Mod assist;+2 physical assistance General transfer comment: Attempted standing at RW x3 trials with min-mod A x2 but pt unable to achieve full stand 2/2 sharp L foot pain with any WB'ing. Increased time and vc's required for all attempts.  Ambulation/Gait General Gait Details: Unable to assess 2/2 pt not able to stand this date   Stairs    Wheelchair Mobility    Modified Rankin (Stroke Patients Only)       Balance Overall balance assessment: Needs assistance Sitting-balance support: Bilateral upper extremity supported;Feet supported Sitting balance-Leahy Scale: Good Standing balance support: Bilateral upper extremity supported (on RW) Standing balance-Leahy Scale: Poor Standing balance comment: Pt unable to achieve full stand with min-mod A x2    Cognition Arousal/Alertness: Awake/alert Behavior During Therapy: WFL for tasks assessed/performed Overall Cognitive Status:  (Oriented but forgetful at times Pt has absolutely no recall of ambulating 172ft with PT 8/11   Exercises  Ankle Circles/Pumps: AROM;Strengthening;Both;10 reps;Supine Quad Sets: AROM;Strengthening;Both;10 reps;Supine Short Arc Quad: AROM;Strengthening;Both;10 reps;Supine Heel Slides: AROM;Strengthening;Both;10  reps;Supine Hip ABduction/ADduction: AROM;Strengthening;Both;10 reps;Supine General Exercises - Lower  Extremity Straight Leg Raises: AROM;Strengthening;Both;10 reps;Supine    General Comments Nursing cleared pt for participation in physical therapy.  Pt agreeable to PT session.       Pertinent Vitals/Pain Pain Assessment: Faces Faces Pain Scale: Hurts even more Pain Location: L foot; distal to medial malleolous (extensor digitorum tendon distribution) Pain Descriptors / Indicators: Sharp Pain Intervention(s): Limited activity within patient's tolerance;Monitored during session;Repositioned  No pain with light palpation; pain with active dorsiflexion and WB'ing. RN notified.  Vitals screened and noted to be as follows:   BP (mmHg)  HR (bpm)  SaO2 (%)  Pre-session 123/63 78 98%  Sitting EOB 133/83 82 92%  Post-session 125/57 79 97%              PT Goals (current goals can now be found in the care plan section) Acute Rehab PT Goals PT Goal Formulation: With patient Time For Goal Achievement: 10/09/15 Potential to Achieve Goals: Good Progress towards PT goals: Not progressing toward goals - comment (Unable to assess OOB mobility this session 2/2 pt's c/o L foot pain)    Frequency  Min 2X/week    PT Plan Current plan and goals remain appropriate       End of Session Equipment Utilized During Treatment: Gait belt;Oxygen Activity Tolerance: Patient limited by fatigue;Patient limited by pain Patient left: in bed;with call bell/phone within reach;with bed alarm set;with family/visitor present  Nursing Communication: mobility status; precautions; L foot pain      Time: [id number] PT Time Calculation (min) (ACUTE ONLY): 39 min  Charges:                       G Codes:      Suleiman Finigan, SPT 09/28/2015, 4:45 PM

## 2015-09-28 NOTE — Progress Notes (Signed)
Physical therapy notified nurse of patient complaining of left foot pain every attempt to stand. Patient was not able to stand because of the pain. Dr. Posey Pronto notified, new orders given for left foot xray 2 view. Will continue to monitor.

## 2015-09-29 ENCOUNTER — Telehealth: Payer: Self-pay | Admitting: Internal Medicine

## 2015-09-29 ENCOUNTER — Ambulatory Visit: Payer: Medicare Other | Admitting: Internal Medicine

## 2015-09-29 DIAGNOSIS — E039 Hypothyroidism, unspecified: Secondary | ICD-10-CM | POA: Diagnosis not present

## 2015-09-29 DIAGNOSIS — J9601 Acute respiratory failure with hypoxia: Secondary | ICD-10-CM | POA: Diagnosis not present

## 2015-09-29 DIAGNOSIS — M2540 Effusion, unspecified joint: Secondary | ICD-10-CM | POA: Diagnosis present

## 2015-09-29 DIAGNOSIS — M79673 Pain in unspecified foot: Secondary | ICD-10-CM

## 2015-09-29 DIAGNOSIS — E7800 Pure hypercholesterolemia, unspecified: Secondary | ICD-10-CM

## 2015-09-29 DIAGNOSIS — R0989 Other specified symptoms and signs involving the circulatory and respiratory systems: Secondary | ICD-10-CM | POA: Diagnosis not present

## 2015-09-29 DIAGNOSIS — E785 Hyperlipidemia, unspecified: Secondary | ICD-10-CM | POA: Diagnosis not present

## 2015-09-29 DIAGNOSIS — I5189 Other ill-defined heart diseases: Secondary | ICD-10-CM | POA: Diagnosis not present

## 2015-09-29 DIAGNOSIS — I959 Hypotension, unspecified: Secondary | ICD-10-CM | POA: Diagnosis not present

## 2015-09-29 DIAGNOSIS — Z743 Need for continuous supervision: Secondary | ICD-10-CM | POA: Diagnosis not present

## 2015-09-29 DIAGNOSIS — I951 Orthostatic hypotension: Secondary | ICD-10-CM | POA: Diagnosis not present

## 2015-09-29 DIAGNOSIS — R609 Edema, unspecified: Secondary | ICD-10-CM | POA: Diagnosis not present

## 2015-09-29 DIAGNOSIS — M6281 Muscle weakness (generalized): Secondary | ICD-10-CM | POA: Diagnosis not present

## 2015-09-29 DIAGNOSIS — R0902 Hypoxemia: Secondary | ICD-10-CM | POA: Diagnosis not present

## 2015-09-29 DIAGNOSIS — K5909 Other constipation: Secondary | ICD-10-CM | POA: Diagnosis not present

## 2015-09-29 DIAGNOSIS — M79672 Pain in left foot: Secondary | ICD-10-CM

## 2015-09-29 DIAGNOSIS — Z791 Long term (current) use of non-steroidal anti-inflammatories (NSAID): Secondary | ICD-10-CM | POA: Diagnosis not present

## 2015-09-29 DIAGNOSIS — I214 Non-ST elevation (NSTEMI) myocardial infarction: Secondary | ICD-10-CM | POA: Diagnosis not present

## 2015-09-29 DIAGNOSIS — I701 Atherosclerosis of renal artery: Secondary | ICD-10-CM | POA: Diagnosis not present

## 2015-09-29 DIAGNOSIS — Z7982 Long term (current) use of aspirin: Secondary | ICD-10-CM | POA: Diagnosis not present

## 2015-09-29 DIAGNOSIS — I517 Cardiomegaly: Secondary | ICD-10-CM | POA: Diagnosis not present

## 2015-09-29 DIAGNOSIS — R531 Weakness: Secondary | ICD-10-CM | POA: Diagnosis not present

## 2015-09-29 DIAGNOSIS — Z9989 Dependence on other enabling machines and devices: Secondary | ICD-10-CM | POA: Diagnosis not present

## 2015-09-29 DIAGNOSIS — J188 Other pneumonia, unspecified organism: Secondary | ICD-10-CM | POA: Diagnosis not present

## 2015-09-29 DIAGNOSIS — R079 Chest pain, unspecified: Secondary | ICD-10-CM | POA: Diagnosis not present

## 2015-09-29 DIAGNOSIS — N390 Urinary tract infection, site not specified: Secondary | ICD-10-CM | POA: Diagnosis not present

## 2015-09-29 DIAGNOSIS — I5032 Chronic diastolic (congestive) heart failure: Secondary | ICD-10-CM | POA: Diagnosis not present

## 2015-09-29 DIAGNOSIS — R600 Localized edema: Secondary | ICD-10-CM | POA: Diagnosis not present

## 2015-09-29 DIAGNOSIS — Z79899 Other long term (current) drug therapy: Secondary | ICD-10-CM | POA: Diagnosis not present

## 2015-09-29 DIAGNOSIS — I7789 Other specified disorders of arteries and arterioles: Secondary | ICD-10-CM | POA: Diagnosis not present

## 2015-09-29 DIAGNOSIS — I2510 Atherosclerotic heart disease of native coronary artery without angina pectoris: Secondary | ICD-10-CM | POA: Diagnosis not present

## 2015-09-29 DIAGNOSIS — K219 Gastro-esophageal reflux disease without esophagitis: Secondary | ICD-10-CM | POA: Diagnosis not present

## 2015-09-29 DIAGNOSIS — R69 Illness, unspecified: Secondary | ICD-10-CM | POA: Diagnosis not present

## 2015-09-29 DIAGNOSIS — I352 Nonrheumatic aortic (valve) stenosis with insufficiency: Secondary | ICD-10-CM | POA: Diagnosis not present

## 2015-09-29 DIAGNOSIS — N179 Acute kidney failure, unspecified: Secondary | ICD-10-CM | POA: Diagnosis not present

## 2015-09-29 DIAGNOSIS — D649 Anemia, unspecified: Secondary | ICD-10-CM | POA: Diagnosis not present

## 2015-09-29 DIAGNOSIS — R040 Epistaxis: Secondary | ICD-10-CM | POA: Diagnosis not present

## 2015-09-29 DIAGNOSIS — E038 Other specified hypothyroidism: Secondary | ICD-10-CM | POA: Diagnosis not present

## 2015-09-29 DIAGNOSIS — R918 Other nonspecific abnormal finding of lung field: Secondary | ICD-10-CM | POA: Diagnosis not present

## 2015-09-29 DIAGNOSIS — I10 Essential (primary) hypertension: Secondary | ICD-10-CM | POA: Diagnosis not present

## 2015-09-29 DIAGNOSIS — M810 Age-related osteoporosis without current pathological fracture: Secondary | ICD-10-CM | POA: Diagnosis not present

## 2015-09-29 DIAGNOSIS — R6889 Other general symptoms and signs: Secondary | ICD-10-CM | POA: Diagnosis not present

## 2015-09-29 DIAGNOSIS — R0602 Shortness of breath: Secondary | ICD-10-CM | POA: Diagnosis not present

## 2015-09-29 LAB — BASIC METABOLIC PANEL
Anion gap: 4 — ABNORMAL LOW (ref 5–15)
BUN: 21 mg/dL — AB (ref 6–20)
CHLORIDE: 110 mmol/L (ref 101–111)
CO2: 29 mmol/L (ref 22–32)
Calcium: 9.1 mg/dL (ref 8.9–10.3)
Creatinine, Ser: 1.3 mg/dL — ABNORMAL HIGH (ref 0.44–1.00)
GFR calc Af Amer: 43 mL/min — ABNORMAL LOW (ref >60)
GFR calc non Af Amer: 37 mL/min — ABNORMAL LOW (ref >60)
GLUCOSE: 100 mg/dL — AB (ref 65–99)
POTASSIUM: 3.8 mmol/L (ref 3.5–5.1)
Sodium: 143 mmol/L (ref 135–145)

## 2015-09-29 LAB — GLUCOSE, CAPILLARY: Glucose-Capillary: 92 mg/dL (ref 65–99)

## 2015-09-29 MED ORDER — MIDODRINE HCL 2.5 MG PO TABS: 2.5000 mg | ORAL_TABLET | Freq: Three times a day (TID) | ORAL | Status: DC

## 2015-09-29 MED ORDER — PREDNISONE 20 MG PO TABS: 40.0000 mg | ORAL_TABLET | ORAL | 0 refills | Status: AC

## 2015-09-29 MED ORDER — PREDNISONE 20 MG PO TABS
40.0000 mg | ORAL_TABLET | Freq: Once | ORAL | Status: DC
  Filled 2015-09-29: qty 2

## 2015-09-29 MED ORDER — CIPROFLOXACIN HCL 250 MG PO TABS: 250.0000 mg | ORAL_TABLET | Freq: Every day | ORAL | 0 refills | Status: AC

## 2015-09-29 MED ORDER — NYSTATIN 100000 UNIT/GM EX CREA: TOPICAL_CREAM | Freq: Two times a day (BID) | CUTANEOUS | 0 refills | Status: AC

## 2015-09-29 NOTE — Progress Notes (Signed)
Patient: Julia Patterson / Admit Date: 09/23/2015 / Date of Encounter: 09/29/2015, 8:45 AM   Subjective: Going home today. No acute overnight events Renal function improved. Magnesium repleted.   Review of Systems: Review of Systems  Constitutional: Positive for malaise/fatigue. Negative for chills, diaphoresis, fever and weight loss.  HENT: Negative for congestion.   Eyes: Negative for discharge and redness.  Respiratory: Negative for cough, sputum production, shortness of breath and wheezing.   Cardiovascular: Negative for chest pain, palpitations, orthopnea, claudication, leg swelling and PND.  Gastrointestinal: Negative for abdominal pain, heartburn, nausea and vomiting.  Musculoskeletal: Negative for falls and myalgias.  Skin: Negative for rash.  Neurological: Negative for dizziness, tingling, tremors, sensory change, speech change, focal weakness, loss of consciousness and weakness.  Endo/Heme/Allergies: Does not bruise/bleed easily.  Psychiatric/Behavioral: Negative for substance abuse. The patient is not nervous/anxious.   All other systems reviewed and are negative.   Objective: Telemetry: NSR, 70's bpm Physical Exam: Blood pressure 129/64, pulse 72, temperature 98 F (36.7 C), temperature source Oral, resp. rate 20, height 4\' 11"  (1.499 m), weight 142 lb 13.7 oz (64.8 kg), SpO2 97 %. Body mass index is 28.85 kg/m. General: Well developed, well nourished, in no acute distress. Head: Normocephalic, atraumatic, sclera non-icteric, no xanthomas, nares are without discharge. Neck: Negative for carotid bruits. JVP not elevated. Lungs: Clear bilaterally to auscultation without wheezes, rales, or rhonchi. Breathing is unlabored. Heart: RRR S1 S2 without murmurs, rubs, or gallops.  Abdomen: Soft, non-tender, non-distended with normoactive bowel sounds. No rebound/guarding. Extremities: No clubbing or cyanosis. No edema. Distal pedal pulses are 2+ and equal  bilaterally. Neuro: Alert and oriented X 3. Moves all extremities spontaneously. Psych:  Responds to questions appropriately with a normal affect.   Intake/Output Summary (Last 24 hours) at 09/29/15 0845 Last data filed at 09/29/15 0824  Gross per 24 hour  Intake                3 ml  Output              760 ml  Net             -757 ml    Inpatient Medications:  . acidophilus  1 capsule Oral Daily  . antiseptic oral rinse  7 mL Mouth Rinse BID  . atorvastatin  40 mg Oral q1800  . calcium citrate-vitamin D  1 tablet Oral BID  . ciprofloxacin  250 mg Oral Q breakfast  . enoxaparin (LOVENOX) injection  30 mg Subcutaneous Q24H  . fluticasone  1 spray Each Nare Daily  . gabapentin  400 mg Oral TID  . levothyroxine  75 mcg Oral QAC breakfast  . Melatonin  2.5 mg Oral Daily  . midodrine  2.5 mg Oral TID WC  . nystatin cream   Topical BID  . oxybutynin  5 mg Oral Daily  . pantoprazole  40 mg Oral Daily  . predniSONE  40 mg Oral Once   Infusions:    Labs:  Recent Labs  09/28/15 0338 09/28/15 1306 09/29/15 0414  NA 141  --  143  K 3.6  --  3.8  CL 109  --  110  CO2 27  --  29  GLUCOSE 102*  --  100*  BUN 27*  --  21*  CREATININE 1.65*  --  1.30*  CALCIUM 8.7*  --  9.1  MG 1.4* 2.1  --   PHOS 4.1  --   --  No results for input(s): AST, ALT, ALKPHOS, BILITOT, PROT, ALBUMIN in the last 72 hours.  Recent Labs  09/28/15 0338  WBC 8.0  HGB 13.3  HCT 38.9  MCV 95.1  PLT 170    Recent Labs  09/27/15 1250 09/27/15 1944 09/28/15 0338  TROPONINI 0.66* 0.57* 0.41*   Invalid input(s): POCBNP No results for input(s): HGBA1C in the last 72 hours.   Weights: Filed Weights   09/23/15 1216 09/27/15 1500  Weight: 135 lb (61.2 kg) 142 lb 13.7 oz (64.8 kg)     Radiology/Studies:  Dg Chest 1 View  Result Date: 09/25/2015 CLINICAL DATA:  Hypoxia EXAM: CHEST 1 VIEW COMPARISON:  09/23/2015 FINDINGS: Cardiomegaly is noted. No infiltrate or pulmonary edema. Large hiatal  hernia. Mild basilar atelectasis. Atherosclerotic calcifications of thoracic aorta. Mild thoracic dextroscoliosis. IMPRESSION: Cardiomegaly. Large hiatal hernia. Mild basilar atelectasis. No infiltrate or pulmonary edema. Electronically Signed   By: Lahoma Crocker M.D.   On: 09/25/2015 11:01   Dg Chest 1 View  Result Date: 09/23/2015 CLINICAL DATA:  Inability to walk. EXAM: CHEST 1 VIEW COMPARISON:  Chest CT 06/24/2014 FINDINGS: Cardiomegaly with heart size accentuated by rotation. Chronic large herniation of stomach and fat at the hiatus. Bibasilar streaky opacity correlating with scar atelectasis on the previous chest CT. There is no edema, consolidation, effusion, or pneumothorax. IMPRESSION: 1. No acute finding. 2. Large hiatal hernia with neighboring atelectasis or scar. Electronically Signed   By: Monte Fantasia M.D.   On: 09/23/2015 13:31   Dg Ankle 2 Views Left  Result Date: 09/28/2015 CLINICAL DATA:  New onset of LEFT ankle pain. EXAM: LEFT ANKLE - 2 VIEW COMPARISON:  None. FINDINGS: No visible fracture. Intact ankle mortise with joint space narrowing. Flocculent calcifications along the medial foot, talonavicular region appear chronic. I doubt that this represents avulsion injuries in the setting of no reported trauma. Plantar spurs are prominent. IMPRESSION: No visible fracture. Intact ankle mortise. Degenerative changes as described. Electronically Signed   By: Staci Righter M.D.   On: 09/28/2015 17:21   Ct Head Wo Contrast  Result Date: 09/23/2015 CLINICAL DATA:  82 year old female with generalize weakness. Initial encounter. EXAM: CT HEAD WITHOUT CONTRAST TECHNIQUE: Contiguous axial images were obtained from the base of the skull through the vertex without intravenous contrast. COMPARISON:  02/25/2006 CT. FINDINGS: No intracranial hemorrhage. No CT evidence of large acute infarct. Partial volume averaging with ventricle (series 2, image 16). No intracranial mass lesion noted on this unenhanced  exam. Mild atrophy without hydrocephalus. Visualized mastoid air cells, middle ear cavities and paranasal sinuses are clear. No acute orbital abnormality. Aspects score: 10 IMPRESSION: No acute intracranial abnormality. Electronically Signed   By: Genia Del M.D.   On: 09/23/2015 12:50   Ct Angio Chest Pe W And/or Wo Contrast  Result Date: 09/23/2015 CLINICAL DATA:  Dyspnea, hypoxia, and elevated troponin. EXAM: CT ANGIOGRAPHY CHEST WITH CONTRAST TECHNIQUE: Multidetector CT imaging of the chest was performed using the standard protocol during bolus administration of intravenous contrast. Multiplanar CT image reconstructions and MIPs were obtained to evaluate the vascular anatomy. CONTRAST:  60 cc Isovue 370 COMPARISON:  Chest x-ray dated 09/23/2015 and CT scan dated 06/24/2014 FINDINGS: Mediastinum/Lymph Nodes: Calcification in the thoracic aorta and in the coronary arteries and mitral valve annulus. Overall heart size is normal. RV LV ratio is normal. Lungs/Pleura: Slight chronic atelectasis at the lung bases due to huge hiatal hernia. Focal bronchiectasis at both lung bases, unchanged. Lungs are otherwise clear. No pulmonary  emboli. Upper abdomen: Large hiatal hernia. Aortic atherosclerosis with stenoses of the origins of the celiac and superior mesenteric arteries. Musculoskeletal: Thoracic scoliosis and accentuated kyphosis. No acute abnormalities. Review of the MIP images confirms the above findings. IMPRESSION: No pulmonary emboli. Aortic atherosclerosis. Huge chronic hiatal hernia with chronic atelectasis and bronchiectasis at the lung bases. Electronically Signed   By: Lorriane Shire M.D.   On: 09/23/2015 15:05     Assessment and Plan  Principal Problem:   NSTEMI (non-ST elevated myocardial infarction) [facility] [place]) Active Problems:   Nausea   Hypercholesterolemia   Weakness   CAD in native artery   Elevated troponin I level   Acute renal failure (HCC)   Arterial hypotension    1.  NSTEMI: -Status post PCI/DES to the mid RCA on 09/24/15 -Continue DAPT with aspirin and Plavix for at least the next 12 months -Unable to tolerate beta blocker 2/2 hypotension, consider adding as outpatient if BP toelrates -Would benefit from cardiac rehab  2. Near syncope/hypotension: -Resolved -Given her diarrhea, likely dehydration vs less likely Coreg -Hold Coreg for now as above, consider restarting as outpatient  -Improved, asymptomatic -She is also weak and deconditioned  -PT -Likely need short-term skilled nursing facility   3. AKI: -Improved -Likely ATN in the setting of her hypotension -Avoid low BP  4. Hypomagnesemia:  -Status post repletion   Signed, Christell Faith, PA-C [place] Pager: [phone] 09/29/2015, 8:45 AM   Attending Note Patient seen and examined, agree with detailed note above,  Patient presentation and plan discussed on rounds.   Patient reports that she feels well as morning, "If only I could walk" Denies any chest pain or shortness of breath  Vital signs reviewed, blood pressure stable Beta blocker held Eating drinking, diarrhea resolved Lab work reviewed, creatinine down to 1.3  Clinical exam lungs are clear, no JVP, heart sounds regular with no murmur, abdomen soft nontender, no leg edema She does report some pain in her left foot "I do not think it is gout"  --- Agree with plan above, hold beta blocker, will start as an outpatient Reason for her hypotension, near syncope is secondary to tremendous diarrhea 3 days ago leading to dehydration as confirmed by elevated creatinine and low blood pressure Continue aspirin and Plavix, statin   Greater than 50% was spent in counseling and coordination of care with patient Total encounter time 25 minutes or more   Signed: Esmond Plants  M.D., Ph.D. [facility]

## 2015-09-29 NOTE — Telephone Encounter (Signed)
HFU, Pt is being discharged from Hospital today. Dx was Non [facility]. No appt avail to sch. Let me know where to sch. Pt needs to be seen 1 week. Thank you!

## 2015-09-29 NOTE — Telephone Encounter (Signed)
Ok

## 2015-09-29 NOTE — Discharge Summary (Signed)
Julia Patterson, 82 y.o., DOB January 08, 1930, MRN [id number]. Admission date: 09/23/2015 Discharge Date 09/29/2015 Primary MD Einar Pheasant, MD Admitting Physician Henreitta Leber, MD  Admission Diagnosis  Weakness [R53.1] Hypoxia [R09.02] Elevated troponin I level [R79.89]  Discharge Diagnosis   Principal Problem:   NSTEMI (non-ST elevated myocardial infarction) ([place])   Hypotension unspecified   Syncope vasovagal   Possible gout flare   Generalize weakness  Hypoxia due to atelectasis   Hypercholesterolemia   Constipation   Nausea   CAD in native artery   GERD   Recent left carpal tunnel release       Hospital Course patient is a 82 year old female with known history of hypertension, hyperlipidemia, hypothyroidism, previous history non-ST MI, osteoporosis, scoliosis and urinary incontinence and GERD who recently had a carpal tunnel release surgery. Went home was doing well until she developed some weakness and difficulty with walking. Patient underwent a CT scan of the head which was benign. CT scan for chest showed no pulmonary embolism. She was noted to have a troponin of 1.7 therefore she was admitted for further evaluation for non-ST MI. For non-ST MI she was seen by cardiology and underwent a cardiac catheterization:  Following was found on the cardiac catheter  Prox LAD to Mid LAD lesion, 20 %stenosed.  Prox RCA lesion, 30 %stenosed.  A STENT RESOLUTE INTEG [id number] drug eluting stent was successfully placed, and does not overlap previously placed stent.  Mid RCA lesion, 80 %stenosed.  Post intervention, there is a 0% residual stenosis.  LV end diastolic pressure is moderately elevated  Patient underwentSuccessful angioplasty and drug-eluting stent placement to the midright coronary artery. She is currently feeling very weak and deconditioned. Was seen by physical therapy who recommended rehabilitation. Patient also was noted to have a UTI which is currently being  treated. Due to her weakness she is in need of rehabilitation. Patient also had a hypoxia with ambulation chest x-ray revealed atelectasis therefore she will need incentive spirometry use and pulmonary toileting. She will be discharged on oxygen therapy. 2 days ago patient was in the process of being discharge when she was sitting on a commode and developed a syncopal-like episode followed by hypotension. She hasn't received fluid boluses. Despite that her blood pressure continued to be low therefore had to be transferred to the ICU. And placed on a Levophed drip. Her blood pressure subsequently normalized. Now she is back to baseline. She is started on Midrin in for that. She was complaining of pain in her left ankle side of the x-ray which shows no evidence of fracture. She does have history of gout in the past so I will treat her with prednisone for a few days. She is very weak and deconditioned and needs rehabilitation therapy.            Consults  cardiology  Significant Tests:  See full reports for all details     Dg Chest 1 View  Result Date: 09/25/2015 CLINICAL DATA:  Hypoxia EXAM: CHEST 1 VIEW COMPARISON:  09/23/2015 FINDINGS: Cardiomegaly is noted. No infiltrate or pulmonary edema. Large hiatal hernia. Mild basilar atelectasis. Atherosclerotic calcifications of thoracic aorta. Mild thoracic dextroscoliosis. IMPRESSION: Cardiomegaly. Large hiatal hernia. Mild basilar atelectasis. No infiltrate or pulmonary edema. Electronically Signed   By: Lahoma Crocker M.D.   On: 09/25/2015 11:01   Dg Chest 1 View  Result Date: 09/23/2015 CLINICAL DATA:  Inability to walk. EXAM: CHEST 1 VIEW COMPARISON:  Chest CT 06/24/2014 FINDINGS: Cardiomegaly with heart size  accentuated by rotation. Chronic large herniation of stomach and fat at the hiatus. Bibasilar streaky opacity correlating with scar atelectasis on the previous chest CT. There is no edema, consolidation, effusion, or pneumothorax. IMPRESSION: 1.  No acute finding. 2. Large hiatal hernia with neighboring atelectasis or scar. Electronically Signed   By: Monte Fantasia M.D.   On: 09/23/2015 13:31   Dg Ankle 2 Views Left  Result Date: 09/28/2015 CLINICAL DATA:  New onset of LEFT ankle pain. EXAM: LEFT ANKLE - 2 VIEW COMPARISON:  None. FINDINGS: No visible fracture. Intact ankle mortise with joint space narrowing. Flocculent calcifications along the medial foot, talonavicular region appear chronic. I doubt that this represents avulsion injuries in the setting of no reported trauma. Plantar spurs are prominent. IMPRESSION: No visible fracture. Intact ankle mortise. Degenerative changes as described. Electronically Signed   By: Staci Righter M.D.   On: 09/28/2015 17:21   Ct Head Wo Contrast  Result Date: 09/23/2015 CLINICAL DATA:  82 year old female with generalize weakness. Initial encounter. EXAM: CT HEAD WITHOUT CONTRAST TECHNIQUE: Contiguous axial images were obtained from the base of the skull through the vertex without intravenous contrast. COMPARISON:  02/25/2006 CT. FINDINGS: No intracranial hemorrhage. No CT evidence of large acute infarct. Partial volume averaging with ventricle (series 2, image 16). No intracranial mass lesion noted on this unenhanced exam. Mild atrophy without hydrocephalus. Visualized mastoid air cells, middle ear cavities and paranasal sinuses are clear. No acute orbital abnormality. Aspects score: 10 IMPRESSION: No acute intracranial abnormality. Electronically Signed   By: Genia Del M.D.   On: 09/23/2015 12:50   Ct Angio Chest Pe W And/or Wo Contrast  Result Date: 09/23/2015 CLINICAL DATA:  Dyspnea, hypoxia, and elevated troponin. EXAM: CT ANGIOGRAPHY CHEST WITH CONTRAST TECHNIQUE: Multidetector CT imaging of the chest was performed using the standard protocol during bolus administration of intravenous contrast. Multiplanar CT image reconstructions and MIPs were obtained to evaluate the vascular anatomy. CONTRAST:  60  cc Isovue 370 COMPARISON:  Chest x-ray dated 09/23/2015 and CT scan dated 06/24/2014 FINDINGS: Mediastinum/Lymph Nodes: Calcification in the thoracic aorta and in the coronary arteries and mitral valve annulus. Overall heart size is normal. RV LV ratio is normal. Lungs/Pleura: Slight chronic atelectasis at the lung bases due to huge hiatal hernia. Focal bronchiectasis at both lung bases, unchanged. Lungs are otherwise clear. No pulmonary emboli. Upper abdomen: Large hiatal hernia. Aortic atherosclerosis with stenoses of the origins of the celiac and superior mesenteric arteries. Musculoskeletal: Thoracic scoliosis and accentuated kyphosis. No acute abnormalities. Review of the MIP images confirms the above findings. IMPRESSION: No pulmonary emboli. Aortic atherosclerosis. Huge chronic hiatal hernia with chronic atelectasis and bronchiectasis at the lung bases. Electronically Signed   By: Lorriane Shire M.D.   On: 09/23/2015 15:05       Today   Subjective:   Lambert Mody  patient is very weak. Denies any chest pain or palpitations Objective:   Blood pressure 129/64, pulse 72, temperature 98 F (36.7 C), temperature source Oral, resp. rate 20, height 4\' 11"  (1.499 m), weight 64.8 kg (142 lb 13.7 oz), SpO2 97 %.  .  Intake/Output Summary (Last 24 hours) at 09/29/15 0832 Last data filed at 09/29/15 0824  Gross per 24 hour  Intake               53 ml  Output              760 ml  Net             -  707 ml    Exam VITAL SIGNS: Blood pressure 129/64, pulse 72, temperature 98 F (36.7 C), temperature source Oral, resp. rate 20, height 4\' 11"  (1.499 m), weight 64.8 kg (142 lb 13.7 oz), SpO2 97 %.  GENERAL:  82 y.o.-year-old patient lying in the bed with no acute distress.  EYES: Pupils equal, round, reactive to light and accommodation. No scleral icterus. Extraocular muscles intact.  HEENT: Head atraumatic, normocephalic. Oropharynx and nasopharynx clear.  NECK:  Supple, no jugular venous  distention. No thyroid enlargement, no tenderness.  LUNGS: Normal breath sounds bilaterally, no wheezing, rales,rhonchi or crepitation. No use of accessory muscles of respiration.  CARDIOVASCULAR: S1, S2 normal. No murmurs, rubs, or gallops.  ABDOMEN: Soft, nontender, nondistended. Bowel sounds present. No organomegaly or mass.  EXTREMITIES: No pedal edema, cyanosis, or clubbing.  NEUROLOGIC: Cranial nerves II through XII are intact. Muscle strength 5/5 in all extremities. Sensation intact. Gait not checked.  PSYCHIATRIC: The patient is alert and oriented x 3.  SKIN: No obvious rash, lesion, or ulcer.   Data Review     CBC w Diff:  Lab Results  Component Value Date   WBC 8.0 09/28/2015   HGB 13.3 09/28/2015   HCT 38.9 09/28/2015   PLT 170 09/28/2015   LYMPHOPCT 18 09/23/2015   MONOPCT 8 09/23/2015   EOSPCT 2 09/23/2015   BASOPCT 1 09/23/2015   CMP:  Lab Results  Component Value Date   NA 143 09/29/2015   K 3.8 09/29/2015   CL 110 09/29/2015   CO2 29 09/29/2015   BUN 21 (H) 09/29/2015   CREATININE 1.30 (H) 09/29/2015   PROT 6.5 09/23/2015   ALBUMIN 3.8 09/23/2015   BILITOT 0.5 09/23/2015   ALKPHOS 44 09/23/2015   AST 30 09/23/2015   ALT 12 (L) 09/23/2015  .  Micro Results Recent Results (from the past 240 hour(s))  Urine culture     Status: Abnormal   Collection Time: 09/25/15  8:53 AM  Result Value Ref Range Status   Specimen Description URINE, CLEAN CATCH  Final   Special Requests NONE  Final   Culture >=100,000 COLONIES/mL KLEBSIELLA OXYTOCA (A)  Final   Report Status 09/28/2015 FINAL  Final   Organism ID, Bacteria KLEBSIELLA OXYTOCA (A)  Final      Susceptibility   Klebsiella oxytoca - MIC*    AMPICILLIN >=32 RESISTANT Resistant     CEFAZOLIN >=64 RESISTANT Resistant     CEFTRIAXONE <=1 SENSITIVE Sensitive     CIPROFLOXACIN <=0.25 SENSITIVE Sensitive     GENTAMICIN <=1 SENSITIVE Sensitive     IMIPENEM 0.5 SENSITIVE Sensitive     NITROFURANTOIN 32  SENSITIVE Sensitive     TRIMETH/SULFA <=20 SENSITIVE Sensitive     AMPICILLIN/SULBACTAM 16 INTERMEDIATE Intermediate     PIP/TAZO <=4 SENSITIVE Sensitive     Extended ESBL NEGATIVE Sensitive     * >=100,000 COLONIES/mL KLEBSIELLA OXYTOCA  MRSA PCR Screening     Status: None   Collection Time: 09/27/15  3:16 PM  Result Value Ref Range Status   MRSA by PCR NEGATIVE NEGATIVE Final    Comment:        The GeneXpert MRSA Assay (FDA approved for NASAL specimens only), is one component of a comprehensive MRSA colonization surveillance program. It is not intended to diagnose MRSA infection nor to guide or monitor treatment for MRSA infections.         Code Status Orders        Start  Ordered   09/24/15 1136  Full code  Continuous     09/24/15 1135    Code Status History    Date Active Date Inactive Code Status Order ID Comments User Context   09/23/2015  5:13 PM 09/23/2015  7:39 PM Full Code [id number]  Henreitta Leber, MD Inpatient    Advance Directive Documentation   Flowsheet Row Most Recent Value  Type of Advance Directive  Living will  Pre-existing out of facility DNR order (yellow form or pink MOST form)  No data  "MOST" Form in Place?  No data           Contact information for follow-up providers    Einar Pheasant, MD Follow up in 1 week(s).   Specialty:  Internal Medicine Why:  Office with call you with an appt Contact information: [id number] [place] [address] [phone]        Kathlyn Sacramento, MD. Go on 10/07/2015.   Specialty:  Cardiology Why:  Hospital Follow-up: appt at 1:30pm Contact information: [place] [address] [phone]            Contact information for after-discharge care    Destination    [facility] .   Specialty:  [place] information: [address] [place] [phone]                   Discharge Medications     Medication List    STOP taking these medications   HYDROcodone-acetaminophen 5-325 MG tablet Commonly known as:  NORCO   meloxicam 7.5 MG tablet Commonly known as:  MOBIC   pravastatin 20 MG tablet Commonly known as:  PRAVACHOL     TAKE these medications   acidophilus Caps capsule Take 1 capsule by mouth daily.   aspirin EC 81 MG tablet Take by mouth.   atorvastatin 40 MG tablet Commonly known as:  LIPITOR Take 1 tablet (40 mg total) by mouth daily at 6 PM.   CALCIUM CITRATE + D 250-200 MG-UNIT Tabs Generic drug:  Calcium Citrate-Vitamin D Take 1 tablet by mouth 2 (two) times daily.   carvedilol 6.25 MG tablet Commonly known as:  COREG Take 1 tablet (6.25 mg total) by mouth 2 (two) times daily with a meal.   ciprofloxacin 250 MG tablet Commonly known as:  CIPRO Take 1 tablet (250 mg total) by mouth daily with breakfast.   clopidogrel 75 MG tablet Commonly known as:  PLAVIX Take 1 tablet (75 mg total) by mouth daily with breakfast.   Coenzyme Q10 100 MG capsule Take 1 or 2 times daily as needed   fluticasone 50 MCG/ACT nasal spray Commonly known as:  FLONASE Place 1 spray into both nostrils daily.   gabapentin 400 MG capsule Commonly known as:  NEURONTIN Take 1 capsule (400 mg total) by mouth 3 (three) times daily.   levothyroxine 75 MCG tablet Commonly known as:  SYNTHROID, LEVOTHROID TAKE 1 TABLET EVERY DAY   Melatonin 3 MG Tabs Take 3 mg by mouth daily.   midodrine 2.5 MG tablet Commonly known as:  PROAMATINE Take 1 tablet (2.5 mg total) by mouth 3 (three) times daily with meals.   MULTI-VITAMINS Tabs Take by mouth.   nystatin cream Commonly known as:  MYCOSTATIN Apply topically 2 (two) times daily.   omeprazole 20 MG capsule Commonly known as:  PRILOSEC TAKE 1 CAPSULE EVERY DAY   ondansetron 4 MG  tablet Commonly known as:  ZOFRAN Take 4 mg by mouth every 8 (eight) hours as needed for nausea or  vomiting.   oxybutynin 5 MG tablet Commonly known as:  DITROPAN TAKE 1 TABLET TWICE DAILY What changed:  See the new instructions.   predniSONE 20 MG tablet Commonly known as:  DELTASONE Take 2 tablets (40 mg total) by mouth 1 day or 1 dose.   VITAMIN D-1000 MAX ST 1000 units tablet Generic drug:  Cholecalciferol Take by mouth.          Total Time in preparing paper work, data evaluation and todays exam - 35 minutes  Dustin Flock M.D on 09/29/2015 at 8:32 AM  [facility]   Office  [phone]

## 2015-09-29 NOTE — Progress Notes (Signed)
Patient discharging to SNF today. Spoke with CSW - he will let me know when packet is ready. Patient resting quietly right now with visitors at bedside. Will continue to monitor.

## 2015-09-29 NOTE — Clinical Social Work Note (Signed)
Patient to be d/c'ed today to [facility].  Patient and family agreeable to plans will transport via ems RN to call report to [phone].  Patient's daughter was at bedside and is aware of patient discharging today.  Evette Cristal, MSW Mon-Fri 8a-4:30p [phone]

## 2015-09-29 NOTE — Telephone Encounter (Signed)
Patient was scheduled for TCM today with Dr. Nicki Reaper, if she is not out of the hospital that needs to be changed. thanks

## 2015-09-29 NOTE — Telephone Encounter (Signed)
See additional telephone note, she was originally scheduled to see PCP today, but looks like she was not discharged yet.

## 2015-09-29 NOTE — Telephone Encounter (Signed)
No problem - thanks!

## 2015-09-29 NOTE — Telephone Encounter (Signed)
Ok. I just saw that. I apologize. Thank you!

## 2015-09-29 NOTE — Progress Notes (Signed)
Report called to [facility] at [facility]. Packet completed by CSW. EMS called for transport. Will remove IV and telemetry box when EMS arrives. Family at bedside aware.

## 2015-09-30 ENCOUNTER — Telehealth: Payer: Self-pay | Admitting: Cardiovascular Disease

## 2015-09-30 NOTE — Telephone Encounter (Signed)
Pt d/c'd to [facility]. Reviewed information w/ pt daughter, [name] (on [place]).   Daughter contacted regarding discharge from [facility] on 8/15.  Daughter understands to follow up with provider Arida on 8/23 at 1:30pm at [facility] [place]. Daughter understands discharge instructions? yes Daughter understands medications and regiment? yes Daughter understands to bring all medications to this visit? yes

## 2015-09-30 NOTE — Telephone Encounter (Signed)
Ok

## 2015-09-30 NOTE — Telephone Encounter (Signed)
Will follow with transitional care management and schedule as appropriate.

## 2015-10-01 ENCOUNTER — Telehealth: Payer: Self-pay

## 2015-10-01 DIAGNOSIS — I214 Non-ST elevation (NSTEMI) myocardial infarction: Secondary | ICD-10-CM | POA: Diagnosis not present

## 2015-10-01 DIAGNOSIS — N390 Urinary tract infection, site not specified: Secondary | ICD-10-CM | POA: Diagnosis not present

## 2015-10-01 DIAGNOSIS — M6281 Muscle weakness (generalized): Secondary | ICD-10-CM | POA: Diagnosis not present

## 2015-10-01 DIAGNOSIS — J188 Other pneumonia, unspecified organism: Secondary | ICD-10-CM | POA: Diagnosis not present

## 2015-10-01 NOTE — Telephone Encounter (Signed)
Called to speak with daughter [name] (HIPPA compliant) regarding transition care management.  No answer.  Left message.  Previous note reads patient has been discharged to [facility] on 09/29/15.  Hospital follow up scheduled with PCP 10/08/15.  Will follow to ensure she is discharged home prior to appointment date.

## 2015-10-07 ENCOUNTER — Ambulatory Visit (INDEPENDENT_AMBULATORY_CARE_PROVIDER_SITE_OTHER): Payer: Medicare Other | Admitting: Cardiovascular Disease

## 2015-10-07 ENCOUNTER — Encounter: Payer: Self-pay | Admitting: Cardiovascular Disease

## 2015-10-07 ENCOUNTER — Telehealth: Payer: Self-pay

## 2015-10-07 ENCOUNTER — Encounter: Payer: Medicare Other | Admitting: Nurse Practitioner

## 2015-10-07 VITALS — BP 100/52 | HR 61 | Ht 59.0 in | Wt 145.0 lb

## 2015-10-07 DIAGNOSIS — I350 Nonrheumatic aortic (valve) stenosis: Secondary | ICD-10-CM | POA: Insufficient documentation

## 2015-10-07 DIAGNOSIS — I10 Essential (primary) hypertension: Secondary | ICD-10-CM

## 2015-10-07 DIAGNOSIS — R0602 Shortness of breath: Secondary | ICD-10-CM | POA: Diagnosis not present

## 2015-10-07 DIAGNOSIS — I2510 Atherosclerotic heart disease of native coronary artery without angina pectoris: Secondary | ICD-10-CM | POA: Diagnosis not present

## 2015-10-07 DIAGNOSIS — I214 Non-ST elevation (NSTEMI) myocardial infarction: Secondary | ICD-10-CM | POA: Diagnosis not present

## 2015-10-07 DIAGNOSIS — I050 Rheumatic mitral stenosis: Secondary | ICD-10-CM | POA: Insufficient documentation

## 2015-10-07 MED ORDER — POTASSIUM CHLORIDE ER 10 MEQ PO TBCR: 10.0000 meq | EXTENDED_RELEASE_TABLET | Freq: Every day | ORAL | 2 refills | Status: DC | PRN

## 2015-10-07 MED ORDER — CARVEDILOL 3.125 MG PO TABS: 3.1250 mg | ORAL_TABLET | Freq: Two times a day (BID) | ORAL | 2 refills | Status: DC

## 2015-10-07 MED ORDER — FUROSEMIDE 20 MG PO TABS: 20.0000 mg | ORAL_TABLET | Freq: Every day | ORAL | 1 refills | Status: DC | PRN

## 2015-10-07 NOTE — Telephone Encounter (Signed)
Hospital follow up appointment cancelled due to continued stay at [facility].  Patient states she will call the office and schedule another appointment once discharged home.

## 2015-10-07 NOTE — Progress Notes (Signed)
Cardiology Office Note  Date:  10/07/2015   ID:  Julia Patterson, DOB 12-14-1929, MRN [id number]  PCP:  Einar Pheasant, MD   Chief Complaint  Patient presents with  . Other    Follow up from [facility] NSTEMI. Meds reviewed by the pt's med list. Denies chest pain.     HPI:  82 year old female with known history of hypertension, hyperlipidemia, hypothyroidism, previous history non-ST MI, osteoporosis, scoliosis and urinary incontinence and GERD With recent carpal tunnel release surgery, 3 presenting to the hospital after she developed some weakness and difficulty with walking,  troponin of 1.7, cardiac catheterization, stent placed to her mid RCA 09/24/2015, repeat presentation to the hospital 09/28/2015 after she developed syncope on the toilet, hypertension, dehydration, heavy diarrhea,  Had been started on carvedilol at the time of previous discharge following non-STEMI, who now presents to the [place] office to establish care and for follow-up after recent hospitalization for near-syncope/syncope  On today's visit, she presents in a wheelchair on oxygen. Reports that she was not on oxygen prior to recent events She has worsening lower extremity edema. Edema is new for her in the past week or so. Also reports some shortness of breath mild at rest, worse on exertion. No recent weights available from the nursing facility Weight is up 6 pounds from 09/15/2015 seen in our clinic. Weight at that time 139, now 145  Initial blood pressure reading was low [id number] systolic, [id number] on my check She denies any further near syncope or syncope Previous notes reviewed indicating long history of "fainting spells."  She was never  on antihypertensive medicine before.   Echo from 09/23/15 reviewed with the patient midly elevtaed RVSP Normal LV function   Other past medical history reviewed Cath 09/24/15  Prox LAD to Mid LAD lesion, 20 %stenosed.  Prox RCA lesion, 30 %stenosed.  A STENT RESOLUTE  INTEG [id number] drug eluting stent was successfully placed, and does not overlap previously placed stent.  Mid RCA lesion, 80 %stenosed.  Post intervention, there is a 0% residual stenosis.  LV end diastolic pressure is moderately elevated.   1. Significant one-vessel coronary artery disease with 80% hazy stenosis in the midright coronary artery which is the likely culprit for non-ST elevation myocardial infarction. No other obstructive disease. 2. Normal LV systolic function by echo. Left ventricular angiography was not performed. Blood pressure was moderately elevated with moderate elevation in left ventricular end-diastolic pressure. 3. Successful angioplasty and drug-eluting stent placement to the midright coronary artery.     PMH:   has a past medical history of CAD in native artery; Carotid artery occlusion; Degenerative arthritis; Dyspnea on exertion; GERD (gastroesophageal reflux disease); History of hiatal hernia; Hypercholesterolemia; Hypertension; Hypothyroidism; Nausea; NSTEMI (non-ST elevated myocardial infarction) ([place]) (09/23/2015); Osteoporosis; Recurrent cystitis; Shortness of breath dyspnea; Urinary incontinence; and Varicose veins.  PSH:    Past Surgical History:  Procedure Laterality Date  . ABDOMINAL HYSTERECTOMY    . BREAST REDUCTION SURGERY  1987  . CARDIAC CATHETERIZATION N/A 09/24/2015   Procedure: Left Heart Cath and Coronary Angiography;  Surgeon: Wellington Hampshire, MD;  Location: [place] [facility];  Service: Cardiovascular;  Laterality: N/A;  . CARDIAC CATHETERIZATION N/A 09/24/2015   Procedure: Coronary Stent Intervention;  Surgeon: Wellington Hampshire, MD;  Location: [place] [facility];  Service: Cardiovascular;  Laterality: N/A;  . CARPAL TUNNEL RELEASE Left 09/21/2015   Procedure: CARPAL TUNNEL RELEASE;  Surgeon: Earnestine Leys, MD;  Location: [facility] OR[facility];  Service: Orthopedics;  Laterality: Left;  .  hysterectomy and bladder repair  1998  . L5-L6 fusion  2/07  .  TONSILLECTOMY AND ADENOIDECTOMY  1950  . trigger finger repair     right fourth  . uterus suspension and sterilization  1963  . varicose vein ligation  1972  . varicose vein sclerotherapy     Dr Hulda Humphrey    Current Outpatient Prescriptions  Medication Sig Dispense Refill  . acetaminophen (TYLENOL) 325 MG tablet Take 650 mg by mouth every 6 (six) hours as needed.    Marland Kitchen acidophilus (RISAQUAD) CAPS capsule Take 1 capsule by mouth daily.    Marland Kitchen aspirin EC 81 MG tablet Take by mouth.    Marland Kitchen atorvastatin (LIPITOR) 40 MG tablet Take 1 tablet (40 mg total) by mouth daily at 6 PM. 30 tablet 0  . Calcium Citrate-Vitamin D (CALCIUM CITRATE + D) 250-200 MG-UNIT TABS Take 1 tablet by mouth 2 (two) times daily.     . Cholecalciferol (VITAMIN D-1000 MAX ST) 1000 UNITS tablet Take by mouth.    . clopidogrel (PLAVIX) 75 MG tablet Take 1 tablet (75 mg total) by mouth daily with breakfast. 30 tablet 0  . Coenzyme Q10 100 MG capsule Take 1 or 2 times daily as needed    . fluticasone (FLONASE) 50 MCG/ACT nasal spray Place 1 spray into both nostrils daily.     Marland Kitchen gabapentin (NEURONTIN) 400 MG capsule Take 1 capsule (400 mg total) by mouth 3 (three) times daily. 60 capsule 3  . guaiFENesin (MUCINEX) 600 MG 12 hr tablet Take by mouth 2 (two) times daily.    Marland Kitchen HYDROcodone-acetaminophen (NORCO/VICODIN) 5-325 MG tablet     . ipratropium-albuterol (DUONEB) 0.5-2.5 (3) MG/3ML SOLN Take 3 mLs by nebulization.    Marland Kitchen levofloxacin (LEVAQUIN) 500 MG tablet Take 500 mg by mouth daily.    Marland Kitchen levothyroxine (SYNTHROID, LEVOTHROID) 75 MCG tablet TAKE 1 TABLET EVERY DAY 90 tablet 3  . Melatonin 3 MG TABS Take 3 mg by mouth daily.    . midodrine (PROAMATINE) 2.5 MG tablet Take 1 tablet (2.5 mg total) by mouth 3 (three) times daily with meals.    . Multiple Vitamin (MULTI-VITAMINS) TABS Take by mouth.    . nystatin cream (MYCOSTATIN) Apply topically 2 (two) times daily. 30 g 0  . omeprazole (PRILOSEC) 20 MG capsule TAKE 1 CAPSULE EVERY  DAY 90 capsule 3  . ondansetron (ZOFRAN) 4 MG tablet Take 4 mg by mouth every 8 (eight) hours as needed for nausea or vomiting.    Marland Kitchen oxybutynin (DITROPAN) 5 MG tablet TAKE 1 TABLET TWICE DAILY (Patient taking differently: TAKE 1 TABLET DAILY) 180 tablet 3  . OXYGEN Inhale 2 L into the lungs daily.    . carvedilol (COREG) 3.125 MG tablet Take 1 tablet (3.125 mg total) by mouth 2 (two) times daily. 60 tablet 2  . furosemide (LASIX) 20 MG tablet Take 1 tablet (20 mg total) by mouth daily as needed. 30 tablet 1  . potassium chloride (K-DUR) 10 MEQ tablet Take 1 tablet (10 mEq total) by mouth daily as needed. 30 tablet 2   No current facility-administered medications for this visit.      Allergies:   Thimerosal   Social History:  The patient  reports that she has never smoked. She has never used smokeless tobacco. She reports that she drinks alcohol. She reports that she does not use drugs.   Family History:   family history includes Breast cancer in her daughter; Heart attack in her father; Rheumatic  fever in her mother.    Review of Systems: Review of Systems  Constitutional: Positive for malaise/fatigue.  Respiratory: Positive for shortness of breath.   Cardiovascular: Positive for leg swelling.  Gastrointestinal: Negative.   Musculoskeletal: Negative.   Neurological: Negative.   Psychiatric/Behavioral: Negative.   All other systems reviewed and are negative.    PHYSICAL EXAM: VS:  BP (!) 100/52 (BP Location: Left Arm, Patient Position: Sitting, Cuff Size: Normal)   Pulse 61   Ht 4\' 11"  (1.499 m)   Wt 145 lb (65.8 kg)   BMI 29.29 kg/m  , BMI Body mass index is 29.29 kg/m. GEN: Well nourished, well developed, in no acute distress , presenting a wheelchair on oxygen by nasal cannula HEENT: normal  Neck: no JVD, carotid bruits, or masses Cardiac: RRR; 2+ murmur LSB, no rubs, or gallops,Trace LE  edema b/l to mid shins Respiratory:  Clear with scant rales at the bases normal  work of breathing GI: soft, nontender, nondistended, + BS MS: no deformity or atrophy  Skin: warm and dry, no rash Neuro:  Strength and sensation are intact Psych: euthymic mood, full affect    Recent Labs: 05/27/2015: TSH 2.08 09/23/2015: ALT 12 09/28/2015: Hemoglobin 13.3; Magnesium 2.1; Platelets 170 09/29/2015: BUN 21; Creatinine, Ser 1.30; Potassium 3.8; Sodium 143    Lipid Panel Lab Results  Component Value Date   CHOL 127 09/24/2015   HDL 32 (L) 09/24/2015   LDLCALC 73 09/24/2015   TRIG 111 09/24/2015      Wt Readings from Last 3 Encounters:  10/07/15 145 lb (65.8 kg)  09/27/15 142 lb 13.7 oz (64.8 kg)  09/21/15 139 lb (63 kg)       ASSESSMENT AND PLAN:  SOB (shortness of breath) - Plan: EKG 12-Lead Shortness of breath worse in the past several weeks now on oxygen, leg edema Likely has acute on chronic diastolic CHF, worse in the setting of her underlying valve disease Likely had fluids while in the hospital for hypotension. Recommended she start Lasix 20 mg daily with potassium for 3 days then 3 days per week Last echocardiogram with mildly elevated right heart pressures We'll try to wean her off oxygen as tolerated. Oxygen by nasal cannula is new in the past several weeks.  CAD in native artery - Plan: EKG 12-Lead Currently with no symptoms of angina. No further workup at this time. Continue current medication regimen. Recent stent laced to her mid RCA  Essential hypertension, benign Recent issues with hypotension, dehydration. We'll decrease the carvedilol down to 3.125 mg twice a day  NSTEMI (non-ST elevated myocardial infarction) [facility]) Recent hospital admission, stent placed to the RCA Continue aspirin and Plavix, beta blocker statin  Aortic valve stenosis Mild aortic valve stenosis by echocardiogram We'll need periodic monitoring  Mitral valve stenosis Previous echocardiogram suggesting mild to moderate mitral valve stenosis   Total encounter  time more than 25 minutes  Greater than 50% was spent in counseling and coordination of care with the patient   Disposition:   F/U  1 month with Dr. Fletcher Anon   Orders Placed This Encounter  Procedures  . EKG 12-Lead     Signed, Esmond Plants, M.D., Ph.D. 10/07/2015  [place], [place]

## 2015-10-07 NOTE — Patient Instructions (Signed)
Medication Instructions:   Please decrease the coreg down to 3.125 mg twice a day  Please take lasix 20 mg daily with KCL 10 meq for three days  Then decrease lasix/potassium down to daily on Monday/wednesday and Friday.  Labwork:  No labs needed  Testing/Procedures:  No new testing  Follow-Up: It was a pleasure seeing you in the office today. Please call us if you have new issues that need to be addressed before your next appt.  [phone]  Your physician wants you to follow-up in: 1 month with Dr. Fletcher Anon.    If you need a refill on your cardiac medications before your next appointment, please call your pharmacy.

## 2015-10-08 ENCOUNTER — Ambulatory Visit: Payer: Medicare Other | Admitting: Internal Medicine

## 2015-10-08 DIAGNOSIS — R600 Localized edema: Secondary | ICD-10-CM | POA: Diagnosis not present

## 2015-10-08 DIAGNOSIS — K5909 Other constipation: Secondary | ICD-10-CM | POA: Diagnosis not present

## 2015-10-08 DIAGNOSIS — R0902 Hypoxemia: Secondary | ICD-10-CM | POA: Diagnosis not present

## 2015-10-08 DIAGNOSIS — R0989 Other specified symptoms and signs involving the circulatory and respiratory systems: Secondary | ICD-10-CM | POA: Diagnosis not present

## 2015-10-13 ENCOUNTER — Emergency Department: Payer: Medicare Other

## 2015-10-13 ENCOUNTER — Emergency Department
Admission: EM | Admit: 2015-10-13 | Discharge: 2015-10-13 | Disposition: A | Discharge status: Home / Self Care | Payer: Medicare Other | Attending: Emergency Medicine | Admitting: Emergency Medicine

## 2015-10-13 ENCOUNTER — Telehealth: Payer: Self-pay | Admitting: Cardiovascular Disease

## 2015-10-13 DIAGNOSIS — R609 Edema, unspecified: Secondary | ICD-10-CM | POA: Diagnosis not present

## 2015-10-13 DIAGNOSIS — Z79899 Other long term (current) drug therapy: Secondary | ICD-10-CM | POA: Insufficient documentation

## 2015-10-13 DIAGNOSIS — I2510 Atherosclerotic heart disease of native coronary artery without angina pectoris: Secondary | ICD-10-CM | POA: Diagnosis not present

## 2015-10-13 DIAGNOSIS — R600 Localized edema: Secondary | ICD-10-CM | POA: Insufficient documentation

## 2015-10-13 DIAGNOSIS — R079 Chest pain, unspecified: Secondary | ICD-10-CM | POA: Diagnosis not present

## 2015-10-13 DIAGNOSIS — K219 Gastro-esophageal reflux disease without esophagitis: Secondary | ICD-10-CM | POA: Diagnosis not present

## 2015-10-13 DIAGNOSIS — I10 Essential (primary) hypertension: Secondary | ICD-10-CM | POA: Insufficient documentation

## 2015-10-13 DIAGNOSIS — Z7982 Long term (current) use of aspirin: Secondary | ICD-10-CM | POA: Insufficient documentation

## 2015-10-13 DIAGNOSIS — I5189 Other ill-defined heart diseases: Secondary | ICD-10-CM | POA: Diagnosis not present

## 2015-10-13 DIAGNOSIS — E039 Hypothyroidism, unspecified: Secondary | ICD-10-CM | POA: Insufficient documentation

## 2015-10-13 LAB — COMPREHENSIVE METABOLIC PANEL
ALT: 13 U/L — AB (ref 14–54)
AST: 18 U/L (ref 15–41)
Albumin: 3.4 g/dL — ABNORMAL LOW (ref 3.5–5.0)
Alkaline Phosphatase: 58 U/L (ref 38–126)
Anion gap: 7 (ref 5–15)
BILIRUBIN TOTAL: 0.5 mg/dL (ref 0.3–1.2)
BUN: 24 mg/dL — AB (ref 6–20)
CO2: 33 mmol/L — ABNORMAL HIGH (ref 22–32)
CREATININE: 1.3 mg/dL — AB (ref 0.44–1.00)
Calcium: 10.1 mg/dL (ref 8.9–10.3)
Chloride: 101 mmol/L (ref 101–111)
GFR, EST AFRICAN AMERICAN: 43 mL/min — AB (ref >60)
GFR, EST NON AFRICAN AMERICAN: 37 mL/min — AB (ref >60)
Glucose, Bld: 152 mg/dL — ABNORMAL HIGH (ref 65–99)
POTASSIUM: 3.5 mmol/L (ref 3.5–5.1)
Sodium: 141 mmol/L (ref 135–145)
TOTAL PROTEIN: 6.4 g/dL — AB (ref 6.5–8.1)

## 2015-10-13 LAB — CBC
HEMATOCRIT: 41.1 % (ref 35.0–47.0)
Hemoglobin: 14.2 g/dL (ref 12.0–16.0)
MCH: 31.9 pg (ref 26.0–34.0)
MCHC: 34.5 g/dL (ref 32.0–36.0)
MCV: 92.7 fL (ref 80.0–100.0)
Platelets: 218 10*3/uL (ref 150–440)
RBC: 4.44 MIL/uL (ref 3.80–5.20)
RDW: 13.3 % (ref 11.5–14.5)
WBC: 9.1 10*3/uL (ref 3.6–11.0)

## 2015-10-13 LAB — TROPONIN I

## 2015-10-13 NOTE — ED Provider Notes (Signed)
[facility] Emergency Department Provider Note  Time seen: 7:59 PM  I have reviewed the triage vital signs and the nursing notes.   HISTORY  Chief Complaint Joint Swelling    HPI Julia Patterson is a 82 y.o. female with a past medical history of MI 2 weeks ago, hypertension, hyperlipidemia, who presents the emergency department for lower shortly swelling. According to record review the patient hadn't NSTEMI approximately 2 weeks ago. Patient lives at [facility] facility, states for the past 2-3 days she has had lower externally swelling. [facility] sent the patient to the emergency department today for evaluation due to low trauma swelling. She denies any complaints at this time. Denies any leg pain. Denies any shortness of breath or chest pain. Patient did have a stent placed 09/27/15.  Past Medical History:  Diagnosis Date  . CAD in native artery    a. cath 09/24/15: p-mLAD 20%, pRCA 30%, mRCA 80% s/p PCI/DES 0%, nl EF by echo, LVEDP mod elevated  . Carotid artery occlusion   . Degenerative arthritis    lumbar spine.  spondylolisthesis, hip and leg pain, scoliosis  . Dyspnea on exertion    a. low-risk NST in 02/2014 b. echo 09/2015: EF 55% to 60%, no WMA, Grade 1 DD, mild AS, mild to moderate MS, mild MR, PA peak pressure of 45 mm Hg (S)  . GERD (gastroesophageal reflux disease)   . History of hiatal hernia   . Hypercholesterolemia   . Hypertension   . Hypothyroidism   . Nausea   . NSTEMI (non-ST elevated myocardial infarction) ([place]) 09/23/2015  . Osteoporosis    s/p fosamax, boniva, reclast, forteo.  bilateral spontaneous femur fractures  . Recurrent cystitis   . Shortness of breath dyspnea   . Urinary incontinence   . Varicose veins    chronic venous insufficiency    Patient Active Problem List   Diagnosis Date Noted  . Aortic valve stenosis 10/07/2015  . Mitral valve stenosis 10/07/2015  . Pain of foot   . Elevated  troponin I level   . Acute renal failure ([place])   . Arterial hypotension   . Nausea 09/24/2015  . CAD in native artery   . NSTEMI (non-ST elevated myocardial infarction) ([place]) 09/23/2015  . Weakness   . Pre-op evaluation 09/11/2015  . Health care maintenance 11/02/2014  . Breathlessness on exertion 07/29/2014  . 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 Hiatal hernia 07/01/2014  . Conjunctivitis 04/13/2014  . SOB (shortness of breath) 04/13/2014  . Post-nasal drainage 03/23/2014  . Chest pain 02/12/2014  . Rash 02/12/2014  . Cardiac murmur 02/12/2014  . Skin lesion 10/12/2013  . Carotid artery disease ([place]) 10/02/2013  . Renal artery stenosis ([place]) 06/12/2013  . Left upper quadrant pain 02/20/2013  . Calculus of kidney 05/01/2012  . Bilateral inguinal hernia 05/01/2012  . 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 Essential hypertension, benign 04/25/2012  . Abdominal pain, right upper quadrant 04/25/2012  . Renal colic 0000000  . Symptoms involving urinary system 04/25/2012  . Urge incontinence 04/25/2012  . FOM (frequency of micturition) 04/25/2012  . Hypothyroidism 03/02/2012  . GERD (gastroesophageal reflux disease) 03/02/2012  . Urinary incontinence 03/02/2012  . Osteoporosis 03/02/2012  . Degenerative arthritis 03/02/2012  . Hypercholesterolemia 02/28/2012  . Bladder infection, chronic 11/23/2011  . Incomplete bladder emptying 11/23/2011  . LBP (low back pain) 11/23/2011  . Mixed incontinence 11/23/2011  . Atrophic vaginitis 11/23/2011  . Infection of  urinary tract 11/23/2011  . Cystocele, midline 11/23/2011    Past Surgical History:  Procedure Laterality Date  . ABDOMINAL HYSTERECTOMY    . BREAST REDUCTION SURGERY  1987  . CARDIAC CATHETERIZATION N/A 09/24/2015   Procedure: Left Heart Cath and Coronary Angiography;  Surgeon: Wellington Hampshire, MD;  Location: [place] [facility];  Service: Cardiovascular;  Laterality: N/A;  . CARDIAC  CATHETERIZATION N/A 09/24/2015   Procedure: Coronary Stent Intervention;  Surgeon: Wellington Hampshire, MD;  Location: [place] [facility];  Service: Cardiovascular;  Laterality: N/A;  . CARPAL TUNNEL RELEASE Left 09/21/2015   Procedure: CARPAL TUNNEL RELEASE;  Surgeon: Earnestine Leys, MD;  Location: [facility] ORS;  Service: Orthopedics;  Laterality: Left;  . hysterectomy and bladder repair  1998  . L5-L6 fusion  2/07  . TONSILLECTOMY AND ADENOIDECTOMY  1950  . trigger finger repair     right fourth  . uterus suspension and sterilization  1963  . varicose vein ligation  1972  . varicose vein sclerotherapy     Dr Hulda Humphrey    Prior to Admission medications   Medication Sig Start Date End Date Taking? Authorizing Provider  acetaminophen (TYLENOL) 325 MG tablet Take 650 mg by mouth every 6 (six) hours as needed.    Historical Provider, MD  acidophilus (RISAQUAD) CAPS capsule Take 1 capsule by mouth daily.    Historical Provider, MD  aspirin EC 81 MG tablet Take by mouth.    Historical Provider, MD  atorvastatin (LIPITOR) 40 MG tablet Take 1 tablet (40 mg total) by mouth daily at 6 PM. 09/25/15   Loletha Grayer, MD  Calcium Citrate-Vitamin D (CALCIUM CITRATE + D) 250-200 MG-UNIT TABS Take 1 tablet by mouth 2 (two) times daily.     Historical Provider, MD  carvedilol (COREG) 3.125 MG tablet Take 1 tablet (3.125 mg total) by mouth 2 (two) times daily. 10/07/15 01/05/16  Minna Merritts, MD  Cholecalciferol (VITAMIN D-1000 MAX ST) 1000 UNITS tablet Take by mouth.    Historical Provider, MD  clopidogrel (PLAVIX) 75 MG tablet Take 1 tablet (75 mg total) by mouth daily with breakfast. 09/25/15   Loletha Grayer, MD  Coenzyme Q10 100 MG capsule Take 1 or 2 times daily as needed    Historical Provider, MD  fluticasone (FLONASE) 50 MCG/ACT nasal spray Place 1 spray into both nostrils daily.     Historical Provider, MD  furosemide (LASIX) 20 MG tablet Take 1 tablet (20 mg total) by mouth daily as needed. 10/07/15  01/05/16  Minna Merritts, MD  gabapentin (NEURONTIN) 400 MG capsule Take 1 capsule (400 mg total) by mouth 3 (three) times daily. 09/21/15   Earnestine Leys, MD  guaiFENesin (MUCINEX) 600 MG 12 hr tablet Take by mouth 2 (two) times daily.    Historical Provider, MD  HYDROcodone-acetaminophen (NORCO/VICODIN) 5-325 MG tablet  09/21/15   Historical Provider, MD  ipratropium-albuterol (DUONEB) 0.5-2.5 (3) MG/3ML SOLN Take 3 mLs by nebulization.    Historical Provider, MD  levofloxacin (LEVAQUIN) 500 MG tablet Take 500 mg by mouth daily.    Historical Provider, MD  levothyroxine (SYNTHROID, LEVOTHROID) 75 MCG tablet TAKE 1 TABLET EVERY DAY 04/17/15   Einar Pheasant, MD  Melatonin 3 MG TABS Take 3 mg by mouth daily.    Historical Provider, MD  midodrine (PROAMATINE) 2.5 MG tablet Take 1 tablet (2.5 mg total) by mouth 3 (three) times daily with meals. 09/29/15   Dustin Flock, MD  Multiple Vitamin (MULTI-VITAMINS) TABS Take by mouth.  Historical Provider, MD  nystatin cream (MYCOSTATIN) Apply topically 2 (two) times daily. 09/29/15   Dustin Flock, MD  omeprazole (PRILOSEC) 20 MG capsule TAKE 1 CAPSULE EVERY DAY 06/03/15   Einar Pheasant, MD  ondansetron (ZOFRAN) 4 MG tablet Take 4 mg by mouth every 8 (eight) hours as needed for nausea or vomiting.    Historical Provider, MD  oxybutynin (DITROPAN) 5 MG tablet TAKE 1 TABLET TWICE DAILY Patient taking differently: TAKE 1 TABLET DAILY 08/31/15   Nori Riis, PA-C  OXYGEN Inhale 2 L into the lungs daily.    Historical Provider, MD  potassium chloride (K-DUR) 10 MEQ tablet Take 1 tablet (10 mEq total) by mouth daily as needed. 10/07/15 01/05/16  Minna Merritts, MD    Allergies  Allergen Reactions  . Thimerosal Rash    Family History  Problem Relation Age of Onset  . Rheumatic fever Mother     died age 21  . Heart attack Father   . Breast cancer Daughter   . Heart disease      father's family  . Diabetes      father's family    Social  History Social History  Substance Use Topics  . Smoking status: Never Smoker  . Smokeless tobacco: Never Used  . Alcohol use No     Comment: rarely    Review of Systems Constitutional: Negative for fever Cardiovascular: Negative for chest pain. Respiratory: Negative for shortness of breath. Gastrointestinal: Negative for abdominal pain Musculoskeletal: Negative for back pain. Neurological: Negative for headache 10-point ROS otherwise negative.  ____________________________________________   PHYSICAL EXAM:  VITAL SIGNS: ED Triage Vitals  Enc Vitals Group     BP 10/13/15 1917 111/62     Pulse Rate 10/13/15 1917 75     Resp 10/13/15 1917 14     Temp 10/13/15 1917 98.1 F (36.7 C)     Temp Source 10/13/15 1917 Oral     SpO2 10/13/15 1917 98 %     Weight 10/13/15 1911 149 lb (67.6 kg)     Height 10/13/15 1911 4\' 11"  (1.499 m)     Head Circumference --      Peak Flow --      Pain Score 10/13/15 1913 0     Pain Loc --      Pain Edu? --      Excl. in [place]? --     Constitutional: Alert and oriented. Well appearing and in no distress. Eyes: Normal exam ENT   Head: Normocephalic and atraumatic.   Mouth/Throat: Mucous membranes are moist. Cardiovascular: Normal rate, regular rhythm.  Respiratory: Normal respiratory effort without tachypnea nor retractions. Breath sounds are clear  Gastrointestinal: Soft and nontender. No distention.   Musculoskeletal: Nontender with normal range of motion in all extremities. Mild lower extremity edema bilaterally. No calf tenderness. No erythema. Neurologic:  Normal speech and language. No gross focal neurologic deficits are appreciated. Skin:  Skin is warm, dry and intact.  Psychiatric: Mood and affect are normal. Speech and behavior are normal.   ____________________________________________    EKG  EKG reviewed and interpreted, such as normal sinus rhythm at 77 bpm, what QRS, normal axis, morphology most consistent with left  bundle branch block. Unchanged from 10/07/15.  ____________________________________________    RADIOLOGY  Chest x-ray shows enlargement of the cardiac silhouette without edema.  ____________________________________________   INITIAL IMPRESSION / ASSESSMENT AND PLAN / ED COURSE  Pertinent labs & imaging results that were available during my care of  the patient were reviewed by me and considered in my medical decision making (see chart for details).  The patient presents emergency department for lower extremity edema for the past 3 days. Patient sent by her nursing facility for lower extremity edema. Patient recently suffered an NSTEMI 09/27/15. Patient's labs today are largely at her baseline. Troponin is negative. Chest x-ray is clear. EKG is unchanged. The patient has no symptoms at this time. I believe the patient is safe for outpatient follow-up with Dr. Fletcher Anon, which is scheduled already per patient. Patient had an echocardiogram showing normal left ventricular function per her discharge summary.  Chest x-ray consistent with cardiac enlargement without edema. Labs are within normal limits including a negative troponin. Patient continues to have no complaints in the emergency department, patient doesn't mild lower extremity edema bilaterally on examination. We will discharge the patient home with cardiology follow-up for repeat echocardiogram and management of her peripheral edema. ____________________________________________   FINAL CLINICAL IMPRESSION(S) / ED DIAGNOSES  Peripheral edema    Harvest Dark, MD 10/13/15 2114

## 2015-10-13 NOTE — ED Notes (Addendum)
Pt arrived via ems for c/o swelling in feet and ankles for the last 3-4 days (per [facility]) - +2 pitting edema in bilat ankles/feet - pedal pulses felt with decreased strength in left foot - no warmth noted - no redness noted - per pt request called son-in-law and left message about her being in hospital - pt is confused about time of day and unsure why she is at the hospital but is aware of where she is and oriented to person

## 2015-10-13 NOTE — Telephone Encounter (Signed)
Arida pt.  Dr. Rockey Situ saw from [facility] schedule on 8/23.

## 2015-10-13 NOTE — Telephone Encounter (Signed)
S/w Anne Shutter, nurse at [facility], who reports pt has had a decline, oxygen demand has gone up, unable to participate in therapy at the level they want her to. Oxygen level drops into low 80s when ambulating from the bed to the chair. Oxygen had to be increased from 2L to 4L. Now at 3L, sats 91-93%.  Upper and lower extremities cool. Sunday she had episode of difficulty swallowing, coughing, lips turned blue. States CXR did not show aspiration. She has 2+ lower extremity pitting edema, lasix resumed daily.  Physician at [facility] ordered labs for tomorrow morning. NP at [facility] asks for an order for echo. Guerry Minors states "something is different and she is declining". They have not been weighing pt daily until yesterday. Yesterday's weight 144.9lbs, 145lb today. Guerry Minors is unsure how to proceed but is concerned.  Per verbal from Dr. Fletcher Anon, pt should proceed to the ED. Informed Guerry Minors of recommendations. She verbalized understanding with no further questions.  Pt to proceed to [facility] ED.

## 2015-10-13 NOTE — ED Notes (Signed)
Per pt request report/discharge information given to her son-in-law Dr Liz Beach - secretary notified of need for ems transport back to [facility] in [place]

## 2015-10-13 NOTE — ED Triage Notes (Signed)
Pt arrived via ems for c/o swelling in feet and ankles for the last 3-4 days

## 2015-10-13 NOTE — Telephone Encounter (Signed)
Nurse states pt has a lot of SOB, states her oxygen is dropping. States he extremities are "cooler", and have to be warmed to get her pulse ox. States she has pitting edema in her lower extremities.  She thinks pt needs an echo.

## 2015-10-15 DIAGNOSIS — I5189 Other ill-defined heart diseases: Secondary | ICD-10-CM | POA: Diagnosis not present

## 2015-10-22 DIAGNOSIS — E038 Other specified hypothyroidism: Secondary | ICD-10-CM | POA: Diagnosis not present

## 2015-10-22 DIAGNOSIS — K5909 Other constipation: Secondary | ICD-10-CM | POA: Diagnosis not present

## 2015-10-27 ENCOUNTER — Telehealth: Payer: Self-pay | Admitting: Cardiovascular Disease

## 2015-10-27 DIAGNOSIS — I5189 Other ill-defined heart diseases: Secondary | ICD-10-CM | POA: Diagnosis not present

## 2015-10-27 DIAGNOSIS — R040 Epistaxis: Secondary | ICD-10-CM | POA: Diagnosis not present

## 2015-10-27 NOTE — Telephone Encounter (Signed)
Pt called today stating she had an echo Sept 5 at [facility] where she currently resides and has not received results. Informed pt Dr. Fletcher Anon did not order this and she will need to check with staff at [facility]. She verbalized understanding and is agreeable w/plan.

## 2015-11-03 DIAGNOSIS — M810 Age-related osteoporosis without current pathological fracture: Secondary | ICD-10-CM | POA: Diagnosis not present

## 2015-11-08 ENCOUNTER — Emergency Department
Admission: EM | Admit: 2015-11-08 | Discharge: 2015-11-08 | Disposition: A | Discharge status: Home / Self Care | Payer: Medicare Other | Attending: Emergency Medicine | Admitting: Emergency Medicine

## 2015-11-08 DIAGNOSIS — I2510 Atherosclerotic heart disease of native coronary artery without angina pectoris: Secondary | ICD-10-CM | POA: Diagnosis not present

## 2015-11-08 DIAGNOSIS — R040 Epistaxis: Secondary | ICD-10-CM

## 2015-11-08 DIAGNOSIS — Z79899 Other long term (current) drug therapy: Secondary | ICD-10-CM | POA: Insufficient documentation

## 2015-11-08 DIAGNOSIS — Z7982 Long term (current) use of aspirin: Secondary | ICD-10-CM | POA: Diagnosis not present

## 2015-11-08 DIAGNOSIS — E039 Hypothyroidism, unspecified: Secondary | ICD-10-CM | POA: Insufficient documentation

## 2015-11-08 DIAGNOSIS — Z791 Long term (current) use of non-steroidal anti-inflammatories (NSAID): Secondary | ICD-10-CM | POA: Diagnosis not present

## 2015-11-08 DIAGNOSIS — I10 Essential (primary) hypertension: Secondary | ICD-10-CM | POA: Diagnosis not present

## 2015-11-08 MED ORDER — BACITRACIN ZINC 500 UNIT/GM EX OINT
TOPICAL_OINTMENT | CUTANEOUS | Status: AC
  Filled 2015-11-08: qty 0.9

## 2015-11-08 NOTE — ED Triage Notes (Signed)
Pt brought in by EMS from [facility] with nose bleed x 1 hour.  EMS used spray with no results.  EDP present upon arrival and placed rhino rocket to L nare.  Pt tolerated well.   Pt is presently on plavix as well.

## 2015-11-08 NOTE — ED Notes (Signed)
No new bleeding noted. Call bell at side.

## 2015-11-08 NOTE — ED Notes (Signed)
Pt sleeping. resps unlabored. No new bleeding noted. Call bell at right side.

## 2015-11-08 NOTE — ED Provider Notes (Signed)
[facility] Emergency Department Provider Note    First MD Initiated Contact with Patient 11/08/15 0230     (approximate)  I have reviewed the triage vital signs and the nursing notes.   HISTORY  Chief Complaint Epistaxis    HPI Julia Patterson is a 82 y.o. female presents via EMS from [facility] with active nose bleed times one hour. EMS stated that he gave 2 sprays of Afrin without any improvement. Patient presents to the emergency department still actively bleeding. Of note patient is currently taking Plavix.   Past Medical History:  Diagnosis Date  . CAD in native artery    a. cath 09/24/15: p-mLAD 20%, pRCA 30%, mRCA 80% s/p PCI/DES 0%, nl EF by echo, LVEDP mod elevated  . Carotid artery occlusion   . Degenerative arthritis    lumbar spine.  spondylolisthesis, hip and leg pain, scoliosis  . Dyspnea on exertion    a. low-risk NST in 02/2014 b. echo 09/2015: EF 55% to 60%, no WMA, Grade 1 DD, mild AS, mild to moderate MS, mild MR, PA peak pressure of 45 mm Hg (S)  . GERD (gastroesophageal reflux disease)   . History of hiatal hernia   . Hypercholesterolemia   . Hypertension   . Hypothyroidism   . Nausea   . NSTEMI (non-ST elevated myocardial infarction) ([place]) 09/23/2015  . Osteoporosis    s/p fosamax, boniva, reclast, forteo.  bilateral spontaneous femur fractures  . Recurrent cystitis   . Shortness of breath dyspnea   . Urinary incontinence   . Varicose veins    chronic venous insufficiency    Patient Active Problem List   Diagnosis Date Noted  . Aortic valve stenosis 10/07/2015  . Mitral valve stenosis 10/07/2015  . Pain of foot   . Elevated troponin I level   . Acute renal failure ([place])   . Arterial hypotension   . Nausea 09/24/2015  . CAD in native artery   . NSTEMI (non-ST elevated myocardial infarction) ([place]) 09/23/2015  . Weakness   . Pre-op evaluation 09/11/2015  . Health care maintenance 11/02/2014  .  Breathlessness on exertion 07/29/2014  . 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 Hiatal hernia 07/01/2014  . Conjunctivitis 04/13/2014  . SOB (shortness of breath) 04/13/2014  . Post-nasal drainage 03/23/2014  . Chest pain 02/12/2014  . Rash 02/12/2014  . Cardiac murmur 02/12/2014  . Skin lesion 10/12/2013  . Carotid artery disease ([place]) 10/02/2013  . Renal artery stenosis ([place]) 06/12/2013  . Left upper quadrant pain 02/20/2013  . Calculus of kidney 05/01/2012  . Bilateral inguinal hernia 05/01/2012  . 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 Essential hypertension, benign 04/25/2012  . Abdominal pain, right upper quadrant 04/25/2012  . Renal colic 0000000  . Symptoms involving urinary system 04/25/2012  . Urge incontinence 04/25/2012  . FOM (frequency of micturition) 04/25/2012  . Hypothyroidism 03/02/2012  . GERD (gastroesophageal reflux disease) 03/02/2012  . Urinary incontinence 03/02/2012  . Osteoporosis 03/02/2012  . Degenerative arthritis 03/02/2012  . Hypercholesterolemia 02/28/2012  . Bladder infection, chronic 11/23/2011  . Incomplete bladder emptying 11/23/2011  . LBP (low back pain) 11/23/2011  . Mixed incontinence 11/23/2011  . Atrophic vaginitis 11/23/2011  . Infection of urinary tract 11/23/2011  . Cystocele, midline 11/23/2011    Past Surgical History:  Procedure Laterality Date  . ABDOMINAL HYSTERECTOMY    . BREAST REDUCTION SURGERY  1987  . CARDIAC CATHETERIZATION N/A 09/24/2015   Procedure: Left Heart  Cath and Coronary Angiography;  Surgeon: Wellington Hampshire, MD;  Location: [place] [facility];  Service: Cardiovascular;  Laterality: N/A;  . CARDIAC CATHETERIZATION N/A 09/24/2015   Procedure: Coronary Stent Intervention;  Surgeon: Wellington Hampshire, MD;  Location: [place] [facility];  Service: Cardiovascular;  Laterality: N/A;  . CARPAL TUNNEL RELEASE Left 09/21/2015   Procedure: CARPAL TUNNEL RELEASE;  Surgeon: Earnestine Leys, MD;   Location: [facility] ORS;  Service: Orthopedics;  Laterality: Left;  . hysterectomy and bladder repair  1998  . L5-L6 fusion  2/07  . TONSILLECTOMY AND ADENOIDECTOMY  1950  . trigger finger repair     right fourth  . uterus suspension and sterilization  1963  . varicose vein ligation  1972  . varicose vein sclerotherapy     Dr Hulda Humphrey    Prior to Admission medications   Medication Sig Start Date End Date Taking? Authorizing Provider  acetaminophen (TYLENOL) 325 MG tablet Take 650 mg by mouth every 6 (six) hours as needed.    Historical Provider, MD  aspirin EC 81 MG tablet Take by mouth.    Historical Provider, MD  atorvastatin (LIPITOR) 40 MG tablet Take 1 tablet (40 mg total) by mouth daily at 6 PM. 09/25/15   Loletha Grayer, MD  calcium-vitamin D (OSCAL WITH D) 500-200 MG-UNIT tablet Take 1 tablet by mouth 2 (two) times daily.    Historical Provider, MD  carvedilol (COREG) 3.125 MG tablet Take 1 tablet (3.125 mg total) by mouth 2 (two) times daily. 10/07/15 01/05/16  Minna Merritts, MD  Cholecalciferol (VITAMIN D-1000 MAX ST) 1000 UNITS tablet Take 1,000 Units by mouth daily.     Historical Provider, MD  clopidogrel (PLAVIX) 75 MG tablet Take 1 tablet (75 mg total) by mouth daily with breakfast. 09/25/15   Loletha Grayer, MD  Coenzyme Q10 100 MG capsule Take 100 mg by mouth daily as needed.    Historical Provider, MD  fluticasone (FLONASE) 50 MCG/ACT nasal spray Place 1 spray into both nostrils daily.     Historical Provider, MD  furosemide (LASIX) 20 MG tablet Take 1 tablet (20 mg total) by mouth daily as needed. Patient taking differently: Take 20 mg by mouth daily.  10/07/15 01/05/16  Minna Merritts, MD  gabapentin (NEURONTIN) 400 MG capsule Take 1 capsule (400 mg total) by mouth 3 (three) times daily. 09/21/15   Earnestine Leys, MD  levothyroxine (SYNTHROID, LEVOTHROID) 75 MCG tablet TAKE 1 TABLET EVERY DAY 04/17/15   Einar Pheasant, MD  Melatonin 3 MG TABS Take 3 mg by mouth at bedtime.      Historical Provider, MD  midodrine (PROAMATINE) 2.5 MG tablet Take 1 tablet (2.5 mg total) by mouth 3 (three) times daily with meals. 09/29/15   Dustin Flock, MD  Multiple Vitamin (MULTI-VITAMINS) TABS Take 1 tablet by mouth daily.     Historical Provider, MD  nystatin cream (MYCOSTATIN) Apply topically 2 (two) times daily. 09/29/15   Dustin Flock, MD  omeprazole (PRILOSEC) 20 MG capsule TAKE 1 CAPSULE EVERY DAY 06/03/15   Einar Pheasant, MD  ondansetron (ZOFRAN) 4 MG tablet Take 4 mg by mouth every 8 (eight) hours as needed for nausea or vomiting.    Historical Provider, MD  oxybutynin (DITROPAN) 5 MG tablet TAKE 1 TABLET TWICE DAILY 08/31/15   Nori Riis, PA-C  OXYGEN Inhale 2 L into the lungs daily.    Historical Provider, MD  polyethylene glycol (MIRALAX / GLYCOLAX) packet Take 17 g by mouth daily  as needed for mild constipation or moderate constipation.    Historical Provider, MD  potassium chloride (K-DUR) 10 MEQ tablet Take 1 tablet (10 mEq total) by mouth daily as needed. Patient taking differently: Take 10 mEq by mouth daily. On Mondays, Wednesdays, and Fridays 10/07/15 01/05/16  Minna Merritts, MD  ranitidine (ZANTAC) 75 MG tablet Take 75 mg by mouth daily.    Historical Provider, MD    Allergies Thimerosal  Family History  Problem Relation Age of Onset  . Rheumatic fever Mother     died age 63  . Heart attack Father   . Breast cancer Daughter   . Heart disease      father's family  . Diabetes      father's family    Social History Social History  Substance Use Topics  . Smoking status: Never Smoker  . Smokeless tobacco: Never Used  . Alcohol use No     Comment: rarely    Review of Systems Constitutional: No fever/chills Eyes: No visual changes. ENT: No sore throat.Positive for nosebleed Cardiovascular: Denies chest pain. Respiratory: Denies shortness of breath. Gastrointestinal: No abdominal pain.  No nausea, no vomiting.  No diarrhea.  No  constipation. Genitourinary: Negative for dysuria. Musculoskeletal: Negative for back pain. Skin: Negative for rash. Neurological: Negative for headaches, focal weakness or numbness.  10-point ROS otherwise negative.  ____________________________________________   PHYSICAL EXAM:  VITAL SIGNS: ED Triage Vitals [11/08/15 0229]  Enc Vitals Group     BP (!) 151/88     Pulse Rate 72     Resp 18     Temp 98.1 F (36.7 C)     Temp Source Oral     SpO2 96 %     Weight 149 lb (67.6 kg)     Height 4\' 11"  (1.499 m)     Head Circumference      Peak Flow      Pain Score      Pain Loc      Pain Edu?      Excl. in [place]?     Constitutional: Alert and oriented. Well appearing and in no acute distress. Eyes: Conjunctivae are normal. PERRL. EOMI. Head: Atraumatic. Nose: Active bleeding from the left anterior nasal septum Mouth/Throat: Mucous membranes are moist.  Oropharynx non-erythematous. Neck: No stridor.  No meningeal signs.  Cardiovascular: Normal rate, regular rhythm. Good peripheral circulation. Grossly normal heart sounds. Respiratory: Normal respiratory effort.  No retractions. Lungs CTAB. Musculoskeletal: No lower extremity tenderness nor edema. No gross deformities of extremities. Neurologic:  Normal speech and language. No gross focal neurologic deficits are appreciated.  Skin:  Skin is warm, dry and intact. No rash noted. Psychiatric: Mood and affect are normal. Speech and behavior are normal.     .Epistaxis Management Date/Time: 11/08/2015 6:28 AM Performed by: Gregor Hams Authorized by: Gregor Hams   Consent:    Consent obtained:  Verbal   Consent given by:  Patient   Risks discussed:  Nasal injury and pain   Alternatives discussed:  No treatment Anesthesia (see MAR for exact dosages):    Anesthesia method:  None Procedure details:    Treatment site:  L anterior   Treatment method:  Nasal tampon   Treatment complexity:  Limited   Treatment  episode: initial   Post-procedure details:    Assessment:  Bleeding stopped   Patient tolerance of procedure:  Tolerated well, no immediate complications        INITIAL IMPRESSION /  ASSESSMENT AND PLAN / ED COURSE  Pertinent labs & imaging results that were available during my care of the patient were reviewed by me and considered in my medical decision making (see chart for details).  On reevaluation nasal packing came dislodged on his own inspection of the nasal cavity revealed a stable clot in the area of Kiesselbach's plexus. Patient observed for 2 additional hours with no further bleeding. Patient will be discharged home with follow-up with Dr. Pryor Ochoa   Clinical Course    ____________________________________________  FINAL CLINICAL IMPRESSION(S) / ED DIAGNOSES  Final diagnoses:  Anterior epistaxis     MEDICATIONS GIVEN DURING THIS VISIT:  Medications - No data to display   NEW OUTPATIENT MEDICATIONS STARTED DURING THIS VISIT:  New Prescriptions   No medications on file    Modified Medications   No medications on file    Discontinued Medications   No medications on file     Note:  This document was prepared using Dragon voice recognition software and may include unintentional dictation errors.    Gregor Hams, MD 11/08/15 0630

## 2015-11-08 NOTE — ED Notes (Signed)
No new bleeding noted.  

## 2015-11-08 NOTE — ED Notes (Signed)
No new bleeding noted. Pt watching tv, pt declines needs at this time.

## 2015-11-10 ENCOUNTER — Ambulatory Visit (INDEPENDENT_AMBULATORY_CARE_PROVIDER_SITE_OTHER): Payer: Medicare Other | Admitting: Cardiovascular Disease

## 2015-11-10 ENCOUNTER — Encounter: Payer: Self-pay | Admitting: Cardiovascular Disease

## 2015-11-10 VITALS — BP 138/52 | HR 66 | Ht <= 58 in | Wt 148.0 lb

## 2015-11-10 DIAGNOSIS — R040 Epistaxis: Secondary | ICD-10-CM | POA: Diagnosis not present

## 2015-11-10 DIAGNOSIS — I701 Atherosclerosis of renal artery: Secondary | ICD-10-CM

## 2015-11-10 DIAGNOSIS — E785 Hyperlipidemia, unspecified: Secondary | ICD-10-CM

## 2015-11-10 DIAGNOSIS — I2510 Atherosclerotic heart disease of native coronary artery without angina pectoris: Secondary | ICD-10-CM

## 2015-11-10 DIAGNOSIS — I5032 Chronic diastolic (congestive) heart failure: Secondary | ICD-10-CM

## 2015-11-10 DIAGNOSIS — I5189 Other ill-defined heart diseases: Secondary | ICD-10-CM | POA: Diagnosis not present

## 2015-11-10 NOTE — Progress Notes (Signed)
Cardiology Office Note   Date:  11/10/2015   ID:  Julia Patterson, DOB 09-07-1929, MRN [id number]  PCP:  Einar Pheasant, MD  Cardiologist:   Kathlyn Sacramento, MD   Chief Complaint  Patient presents with  . other    1 month f/u no complaints. Meds reviewed verbally with pt.      History of Present Illness: Julia Patterson is a 82 y.o. female who presents for a follow-up visit for coronary artery disease. She has multiple chronic medical conditions that include hyperlipidemia, hypothyroidism, asymptomatic carotid stenosis, renal artery stenosis and left bundle branch block.  She had carpal tunnel surgery in August. She presented shortly after that with weakness and difficulty in walking. She was found to have mildly elevated troponin at 1.7. She underwent cardiac catheterization which showed 80% hazy stenosis in the midright coronary artery with mild disease affecting the LAD. She underwent successful angioplasty and drug-eluting stent placement to the right coronary artery. LVEDP was moderately elevated. Echocardiogram showed normal LV systolic function with grade 2 diastolic dysfunction, moderate mitral regurgitation and mild-to-moderate tricuspid regurgitation. She was readmitted with syncope, hypotension and volume depletion in the setting of diarrhea. She was seen subsequently after that by Dr. Rockey Situ and was noted to be volume overloaded. She was started on small dose Lasix . She went again to the emergency room shortly after that due to worsening leg edema. Her labs were overall unremarkable and chest x-ray did not show congestion. She went to the emergency room 2 days ago due to nosebleed which was stabilized. She was referred to Dr. Pryor Ochoa . She has been doing reasonably well and denies any chest pain or significant dyspnea. She reports gradual improvement in deconditioning. She continues to use oxygen but she doesn't think she needs it.     Past Medical History:    Diagnosis Date  . CAD in native artery    a. cath 09/24/15: p-mLAD 20%, pRCA 30%, mRCA 80% s/p PCI/DES 0%, nl EF by echo, LVEDP mod elevated  . Carotid artery occlusion   . Degenerative arthritis    lumbar spine.  spondylolisthesis, hip and leg pain, scoliosis  . Dyspnea on exertion    a. low-risk NST in 02/2014 b. echo 09/2015: EF 55% to 60%, no WMA, Grade 1 DD, mild AS, mild to moderate MS, mild MR, PA peak pressure of 45 mm Hg (S)  . GERD (gastroesophageal reflux disease)   . History of hiatal hernia   . Hypercholesterolemia   . Hypertension   . Hypothyroidism   . Nausea   . NSTEMI (non-ST elevated myocardial infarction) ([place]) 09/23/2015  . Osteoporosis    s/p fosamax, boniva, reclast, forteo.  bilateral spontaneous femur fractures  . Recurrent cystitis   . Shortness of breath dyspnea   . Urinary incontinence   . Varicose veins    chronic venous insufficiency    Past Surgical History:  Procedure Laterality Date  . ABDOMINAL HYSTERECTOMY    . BREAST REDUCTION SURGERY  1987  . CARDIAC CATHETERIZATION N/A 09/24/2015   Procedure: Left Heart Cath and Coronary Angiography;  Surgeon: Wellington Hampshire, MD;  Location: [place] [facility];  Service: Cardiovascular;  Laterality: N/A;  . CARDIAC CATHETERIZATION N/A 09/24/2015   Procedure: Coronary Stent Intervention;  Surgeon: Wellington Hampshire, MD;  Location: [place] [facility];  Service: Cardiovascular;  Laterality: N/A;  . CARPAL TUNNEL RELEASE Left 09/21/2015   Procedure: CARPAL TUNNEL RELEASE;  Surgeon: Earnestine Leys, MD;  Location:  [facility] OR[facility];  Service: Orthopedics;  Laterality: Left;  . hysterectomy and bladder repair  1998  . L5-L6 fusion  2/07  . TONSILLECTOMY AND ADENOIDECTOMY  1950  . trigger finger repair     right fourth  . uterus suspension and sterilization  1963  . varicose vein ligation  1972  . varicose vein sclerotherapy     Dr Hulda Humphrey     Current Outpatient Prescriptions  Medication Sig Dispense Refill  .  acetaminophen (TYLENOL) 325 MG tablet Take 650 mg by mouth every 6 (six) hours as needed.    Marland Kitchen acetaminophen (TYLENOL) 500 MG chewable tablet Chew 500 mg by mouth every 6 (six) hours as needed for pain.    Marland Kitchen antiseptic oral rinse (BIOTENE) LIQD 15 mLs by Mouth Rinse route as needed for dry mouth.    Marland Kitchen aspirin EC 81 MG tablet Take 81 mg by mouth daily.     Marland Kitchen atorvastatin (LIPITOR) 40 MG tablet Take 1 tablet (40 mg total) by mouth daily at 6 PM. 30 tablet 0  . calcium-vitamin D (OSCAL WITH D) 500-200 MG-UNIT tablet Take 1 tablet by mouth 2 (two) times daily.    . carvedilol (COREG) 3.125 MG tablet Take 1 tablet (3.125 mg total) by mouth 2 (two) times daily. 60 tablet 2  . Cholecalciferol (VITAMIN D-1000 MAX ST) 1000 UNITS tablet Take 1,000 Units by mouth daily.     . clopidogrel (PLAVIX) 75 MG tablet Take 1 tablet (75 mg total) by mouth daily with breakfast. 30 tablet 0  . Coenzyme Q10 100 MG capsule Take 100 mg by mouth daily as needed.    . fluticasone (FLONASE) 50 MCG/ACT nasal spray Place 1 spray into both nostrils daily.     . furosemide (LASIX) 20 MG tablet Take 1 tablet (20 mg total) by mouth daily as needed. (Patient taking differently: Take 30 mg by mouth daily. ) 30 tablet 1  . gabapentin (NEURONTIN) 400 MG capsule Take 1 capsule (400 mg total) by mouth 3 (three) times daily. 60 capsule 3  . levothyroxine (SYNTHROID, LEVOTHROID) 88 MCG tablet Take 88 mcg by mouth daily before breakfast.    . Melatonin 3 MG TABS Take 3 mg by mouth at bedtime.     . midodrine (PROAMATINE) 2.5 MG tablet Take 1 tablet (2.5 mg total) by mouth 3 (three) times daily with meals.    . Multiple Vitamin (MULTI-VITAMINS) TABS Take 1 tablet by mouth daily.     Marland Kitchen nystatin cream (MYCOSTATIN) Apply topically 2 (two) times daily. 30 g 0  . ondansetron (ZOFRAN) 4 MG tablet Take 4 mg by mouth every 8 (eight) hours as needed for nausea or vomiting.    Marland Kitchen oxybutynin (DITROPAN) 5 MG tablet TAKE 1 TABLET TWICE DAILY 180 tablet 3    . OXYGEN Inhale 2 L into the lungs daily.    . polyethylene glycol (MIRALAX / GLYCOLAX) packet Take 17 g by mouth daily as needed for mild constipation or moderate constipation.    . potassium chloride (K-DUR) 10 MEQ tablet Take 1 tablet (10 mEq total) by mouth daily as needed. (Patient taking differently: Take 10 mEq by mouth daily. On Mondays, Wednesdays, and Fridays) 30 tablet 2  . ranitidine (ZANTAC) 75 MG tablet Take 75 mg by mouth daily.    Marland Kitchen senna (SENOKOT) 8.6 MG TABS tablet Take 1 tablet by mouth daily.    . sodium chloride (OCEAN) 0.65 % SOLN nasal spray Place 1 spray into both nostrils as needed for  congestion.     No current facility-administered medications for this visit.     Allergies:   Thimerosal    Social History:  The patient  reports that she has never smoked. She has never used smokeless tobacco. She reports that she does not drink alcohol or use drugs.   Family History:  The patient's family history includes Breast cancer in her daughter; Heart attack in her father; Rheumatic fever in her mother.    ROS:  Please see the history of present illness.   Otherwise, review of systems are positive for none.   All other systems are reviewed and negative.    PHYSICAL EXAM: VS:  BP (!) 138/52 (BP Location: Right Arm, Patient Position: Sitting, Cuff Size: Normal)   Pulse 66   Ht 4\' 9"  (1.448 m)   Wt 148 lb (67.1 kg)   BMI 32.03 kg/m  , BMI Body mass index is 32.03 kg/m. GEN: Well nourished, well developed, in no acute distress  HEENT: normal  Neck: no JVD, carotid bruits, or masses Cardiac: RRR; no rubs, or gallops,no edema . 2 out of 6 mid peaking systolic murmur in the aortic area and left sternal border.  Respiratory:  clear to auscultation bilaterally, normal work of breathing GI: soft, nontender, nondistended, + BS MS: no deformity or atrophy  Skin: warm and dry, no rash Neuro:  Strength and sensation are intact Psych: euthymic mood, full affect   EKG:  EKG  is not ordered today.   Recent Labs: 05/27/2015: TSH 2.08 09/28/2015: Magnesium 2.1 10/13/2015: ALT 13; BUN 24; Creatinine, Ser 1.30; Hemoglobin 14.2; Platelets 218; Potassium 3.5; Sodium 141    Lipid Panel    Component Value Date/Time   CHOL 127 09/24/2015 0843   TRIG 111 09/24/2015 0843   HDL 32 (L) 09/24/2015 0843   CHOLHDL 4.0 09/24/2015 0843   VLDL 22 09/24/2015 0843   LDLCALC 73 09/24/2015 0843      Wt Readings from Last 3 Encounters:  11/10/15 148 lb (67.1 kg)  11/08/15 149 lb (67.6 kg)  10/13/15 149 lb (67.6 kg)        ASSESSMENT AND PLAN:  1.  Coronary artery disease involving native coronary arteries: Status post RCA PCI with a drug-eluting stent placement in August 2017.No anginal symptoms. Continue dual antiplatelet therapy for one year until August 2018. Minimal duration would be 6 months.    2. Chronic diastolic heart failure: She appears to be euvolemic on current dose of furosemide 30 mg once daily.   3. Asymptomatic carotid disease: Followed by [facility].   4. Essential hypertension: Blood pressure is well-controlled. We should consider stopping Midodrin  5. Hyperlipidemia: Currently on atorvastatin 40 mg once daily.   Disposition:   FU with me in 3 months  Signed,  Kathlyn Sacramento, MD  11/10/2015 3:16 PM    [place]

## 2015-11-10 NOTE — Patient Instructions (Signed)
Medication Instructions: Continue same medications.   Labwork: None.   Procedures/Testing: None.   Follow-Up: 3 months with Dr. Fletcher Anon  Any Additional Special Instructions Will Be Listed Below (If Applicable).  Check O2 sat with exercise to see if O2 therapy is still needed.    If you need a refill on your cardiac medications before your next appointment, please call your pharmacy.

## 2015-11-16 ENCOUNTER — Ambulatory Visit (INDEPENDENT_AMBULATORY_CARE_PROVIDER_SITE_OTHER): Payer: Medicare Other | Admitting: Internal Medicine

## 2015-11-16 ENCOUNTER — Encounter: Payer: Self-pay | Admitting: Internal Medicine

## 2015-11-16 VITALS — BP 126/78 | HR 82 | Ht 59.0 in | Wt 129.0 lb

## 2015-11-16 DIAGNOSIS — I701 Atherosclerosis of renal artery: Secondary | ICD-10-CM

## 2015-11-16 DIAGNOSIS — R0602 Shortness of breath: Secondary | ICD-10-CM

## 2015-11-16 NOTE — Assessment & Plan Note (Addendum)
Multifactorial - recent NSTEMI s/p stent placement, deconditioning, left heart disease  The patient noted to have an O2 requirement after her cardiac event, she is currently participating with physical therapy and cardiac rehabilitation and there are concerns that she did have O2 desaturations early in her rehabilitation program, and is now improving slowly. Today's office visit, she was kept without oxygen for the entire visit, and noted to have no O2 requirement at rest. She did walk 2 laps around the office and did not drop below 90% on walking. 2 laps were 272 feet at a slow pace without any desaturations. I do believe at this time that she will have a O2 requirement with more significant exertion and with her physical therapy necessitating 2 L of oxygen with exertion and with physical therapy at this time. As her therapy progresses and she continues with exercise further out from her cardiac event, the O2 requirement will decrease with exertion and physical therapy.  Plan: -2 L of oxygen with physical therapy and exercise and exertion -6 minute walk test prior to follow-up visit in one month

## 2015-11-16 NOTE — Progress Notes (Signed)
MRN# [id number] KENLYN STIBBE [birth year]   CC: Chief Complaint  Patient presents with  . Acute Visit    per [facility] pt unable to maintain 02 stats.  pt states she had a MI 10/2015, pt discharged on 2L. pt states breathing is doing well. denies any sob, cough, wheezing or cp/tightness.       Brief History: 82 yo F with PMHx of large hiatal hernia, allergies, NSTEMI s/p RCA PCI 09/2015, left heat disease, ? Chronic micro aspiration, followed for dyspnea. HRCT with NO significant ILD, mild aspiration findings at the base.  PFTs with mild restriction, severely decreased DLCO.     Events since last clinic visit: She presents for a acute care visit today as a referral from [facility] due to concerns about not mentating O2 saturations. History since her last visit show that she had a NSTEMI, now status post RCA PCI with drug eluding stent placement in August 2017, and on dual antiplatelet therapy for 1 year dental August 2018. Last week she presented to the ER with a short history of nosebleeds and was discharged and advised to follow-up with ENT. No longer with nosebleeds.  Today presents with son-in-law and caretaker.  Family states, that since her cardiac event, she's been in a rehabilitation facility, there are concerns that during physical therapy that her O2 saturation decreases requiring supplemental oxygen, and there are concerns about safety especially at night. Today in the office, O2 was stopped at rest, she did not have any desaturations below 92%. She was also walked 2 laps around our office with out any desaturations at this time.  At the end of her visit on room air throughout the entire visit, end visit O2 sat was 97%  Cardiology Visit with Dr. Fletcher Anon 11/10/15 She had carpal tunnel surgery in August. She presented shortly after that with weakness and difficulty in walking. She was found to have mildly elevated troponin at 1.7. She underwent cardiac catheterization which  showed 80% hazy stenosis in the midright coronary artery with mild disease affecting the LAD. She underwent successful angioplasty and drug-eluting stent placement to the right coronary artery. LVEDP was moderately elevated. Echocardiogram showed normal LV systolic function with grade 2 diastolic dysfunction, moderate mitral regurgitation and mild-to-moderate tricuspid regurgitation. She was readmitted with syncope, hypotension and volume depletion in the setting of diarrhea. She was seen subsequently after that by Dr. Rockey Situ and was noted to be volume overloaded. She was started on small dose Lasix . She went again to the emergency room shortly after that due to worsening leg edema. Her labs were overall unremarkable and chest x-ray did not show congestion. She went to the emergency room 2 days ago due to nosebleed which was stabilized. She was referred to Dr. Pryor Ochoa . She has been doing reasonably well and denies any chest pain or significant dyspnea. She reports gradual improvement in deconditioning. She continues to use oxygen but she doesn't think she needs it.  A/P 1.  Coronary artery disease involving native coronary arteries: Status post RCA PCI with a drug-eluting stent placement in August 2017.No anginal symptoms. Continue dual antiplatelet therapy for one year until August 2018. Minimal duration would be 6 months.    2. Chronic diastolic heart failure: She appears to be euvolemic on current dose of furosemide 30 mg once daily.   3. Asymptomatic carotid disease: Followed by [facility].   4. Essential hypertension: Blood pressure is well-controlled. We should consider stopping Midodrin  5. Hyperlipidemia: Currently on  atorvastatin 40 mg once daily.  Medication:    Current Outpatient Prescriptions:  .  acetaminophen (TYLENOL) 325 MG tablet, Take 650 mg by mouth every 6 (six) hours as needed., Disp: , Rfl:  .  acetaminophen (TYLENOL) 500 MG chewable tablet, Chew 500 mg by mouth every 6 (six)  hours as needed for pain., Disp: , Rfl:  .  antiseptic oral rinse (BIOTENE) LIQD, 15 mLs by Mouth Rinse route as needed for dry mouth., Disp: , Rfl:  .  aspirin EC 81 MG tablet, Take 81 mg by mouth daily. , Disp: , Rfl:  .  atorvastatin (LIPITOR) 40 MG tablet, Take 1 tablet (40 mg total) by mouth daily at 6 PM., Disp: 30 tablet, Rfl: 0 .  calcium-vitamin D (OSCAL WITH D) 500-200 MG-UNIT tablet, Take 1 tablet by mouth 2 (two) times daily., Disp: , Rfl:  .  carvedilol (COREG) 3.125 MG tablet, Take 1 tablet (3.125 mg total) by mouth 2 (two) times daily., Disp: 60 tablet, Rfl: 2 .  Cholecalciferol (VITAMIN D-1000 MAX ST) 1000 UNITS tablet, Take 1,000 Units by mouth daily. , Disp: , Rfl:  .  clopidogrel (PLAVIX) 75 MG tablet, Take 1 tablet (75 mg total) by mouth daily with breakfast., Disp: 30 tablet, Rfl: 0 .  Coenzyme Q10 100 MG capsule, Take 100 mg by mouth daily as needed., Disp: , Rfl:  .  fluticasone (FLONASE) 50 MCG/ACT nasal spray, Place 1 spray into both nostrils daily. , Disp: , Rfl:  .  furosemide (LASIX) 20 MG tablet, Take 1 tablet (20 mg total) by mouth daily as needed. (Patient taking differently: Take 30 mg by mouth daily. ), Disp: 30 tablet, Rfl: 1 .  gabapentin (NEURONTIN) 400 MG capsule, Take 1 capsule (400 mg total) by mouth 3 (three) times daily., Disp: 60 capsule, Rfl: 3 .  levothyroxine (SYNTHROID, LEVOTHROID) 88 MCG tablet, Take 88 mcg by mouth daily before breakfast., Disp: , Rfl:  .  Melatonin 3 MG TABS, Take 3 mg by mouth at bedtime. , Disp: , Rfl:  .  midodrine (PROAMATINE) 2.5 MG tablet, Take 1 tablet (2.5 mg total) by mouth 3 (three) times daily with meals., Disp: , Rfl:  .  Multiple Vitamin (MULTI-VITAMINS) TABS, Take 1 tablet by mouth daily. , Disp: , Rfl:  .  nystatin cream (MYCOSTATIN), Apply topically 2 (two) times daily., Disp: 30 g, Rfl: 0 .  ondansetron (ZOFRAN) 4 MG tablet, Take 4 mg by mouth every 8 (eight) hours as needed for nausea or vomiting., Disp: , Rfl:  .   oxybutynin (DITROPAN) 5 MG tablet, TAKE 1 TABLET TWICE DAILY, Disp: 180 tablet, Rfl: 3 .  OXYGEN, Inhale 2 L into the lungs daily., Disp: , Rfl:  .  polyethylene glycol (MIRALAX / GLYCOLAX) packet, Take 17 g by mouth daily as needed for mild constipation or moderate constipation., Disp: , Rfl:  .  potassium chloride (K-DUR) 10 MEQ tablet, Take 1 tablet (10 mEq total) by mouth daily as needed. (Patient taking differently: Take 10 mEq by mouth daily. On Mondays, Wednesdays, and Fridays), Disp: 30 tablet, Rfl: 2 .  ranitidine (ZANTAC) 75 MG tablet, Take 75 mg by mouth daily., Disp: , Rfl:  .  senna (SENOKOT) 8.6 MG TABS tablet, Take 1 tablet by mouth daily., Disp: , Rfl:  .  sodium chloride (OCEAN) 0.65 % SOLN nasal spray, Place 1 spray into both nostrils as needed for congestion., Disp: , Rfl:     Review of Systems: Gen:  Denies  fever, sweats, chills HEENT: Denies blurred vision, double vision, ear pain, eye pain, hearing loss, nose bleeds, sore throat Cvc:  No dizziness, chest pain or heaviness Resp:   Admits [id number] cough, dyspnea on exertion. Gi: Denies swallowing difficulty, stomach pain, nausea or vomiting, diarrhea, constipation, bowel incontinence Gu:  Denies bladder incontinence, burning urine Ext:   No Joint pain, stiffness or swelling Skin: No skin rash, easy bruising or bleeding or hives Endoc:  No polyuria, polydipsia , polyphagia or weight change Other:  All other systems negative  Allergies:  Thimerosal  Physical Examination:  VS: BP 126/78 (BP Location: Right Arm, Cuff Size: Normal)   Pulse 82   Ht 4\' 11"  (1.499 m)   Wt 129 lb (58.5 kg)   SpO2 97%   BMI 26.05 kg/m   General Appearance: No distress  HEENT: PERRLA, no ptosis, no other lesions noticed Pulmonary:normal breath sounds., diaphragmatic excursion normal.No wheezing, No rales   Cardiovascular:  Normal S1,S2.  No m/r/g.     Abdomen:Exam: Benign, Soft, non-tender, No masses  Skin:   warm, no rashes, no  ecchymosis  Extremities: normal, no cyanosis, clubbing, warm with normal capillary refill.    Assessment and Plan:82 year old female presenting today for followup visit after NSTEMI s/p stent placement, with mild o2 requirement SOB (shortness of breath) Multifactorial - recent NSTEMI s/p stent placement, deconditioning, left heart disease  The patient noted to have an O2 requirement after her cardiac event, she is currently participating with physical therapy and cardiac rehabilitation and there are concerns that she did have O2 desaturations early in her rehabilitation program, and is now improving slowly. Today's office visit, she was kept without oxygen for the entire visit, and noted to have no O2 requirement at rest. She did walk 2 laps around the office and did not drop below 90% on walking. 2 laps were 272 feet at a slow pace without any desaturations. I do believe at this time that she will have a O2 requirement with more significant exertion and with her physical therapy necessitating 2 L of oxygen with exertion and with physical therapy at this time. As her therapy progresses and she continues with exercise further out from her cardiac event, the O2 requirement will decrease with exertion and physical therapy.  Plan: -2 L of oxygen with physical therapy and exercise and exertion -6 minute walk test prior to follow-up visit in one month   Updated Medication List Outpatient Encounter Prescriptions as of 11/16/2015  Medication Sig  . acetaminophen (TYLENOL) 325 MG tablet Take 650 mg by mouth every 6 (six) hours as needed.  Marland Kitchen acetaminophen (TYLENOL) 500 MG chewable tablet Chew 500 mg by mouth every 6 (six) hours as needed for pain.  Marland Kitchen antiseptic oral rinse (BIOTENE) LIQD 15 mLs by Mouth Rinse route as needed for dry mouth.  Marland Kitchen aspirin EC 81 MG tablet Take 81 mg by mouth daily.   Marland Kitchen atorvastatin (LIPITOR) 40 MG tablet Take 1 tablet (40 mg total) by mouth daily at 6 PM.  . calcium-vitamin  D (OSCAL WITH D) 500-200 MG-UNIT tablet Take 1 tablet by mouth 2 (two) times daily.  . carvedilol (COREG) 3.125 MG tablet Take 1 tablet (3.125 mg total) by mouth 2 (two) times daily.  . Cholecalciferol (VITAMIN D-1000 MAX ST) 1000 UNITS tablet Take 1,000 Units by mouth daily.   . clopidogrel (PLAVIX) 75 MG tablet Take 1 tablet (75 mg total) by mouth daily with breakfast.  . Coenzyme Q10 100 MG capsule Take 100  mg by mouth daily as needed.  . fluticasone (FLONASE) 50 MCG/ACT nasal spray Place 1 spray into both nostrils daily.   . furosemide (LASIX) 20 MG tablet Take 1 tablet (20 mg total) by mouth daily as needed. (Patient taking differently: Take 30 mg by mouth daily. )  . gabapentin (NEURONTIN) 400 MG capsule Take 1 capsule (400 mg total) by mouth 3 (three) times daily.  Marland Kitchen levothyroxine (SYNTHROID, LEVOTHROID) 88 MCG tablet Take 88 mcg by mouth daily before breakfast.  . Melatonin 3 MG TABS Take 3 mg by mouth at bedtime.   . midodrine (PROAMATINE) 2.5 MG tablet Take 1 tablet (2.5 mg total) by mouth 3 (three) times daily with meals.  . Multiple Vitamin (MULTI-VITAMINS) TABS Take 1 tablet by mouth daily.   Marland Kitchen nystatin cream (MYCOSTATIN) Apply topically 2 (two) times daily.  . ondansetron (ZOFRAN) 4 MG tablet Take 4 mg by mouth every 8 (eight) hours as needed for nausea or vomiting.  Marland Kitchen oxybutynin (DITROPAN) 5 MG tablet TAKE 1 TABLET TWICE DAILY  . OXYGEN Inhale 2 L into the lungs daily.  . polyethylene glycol (MIRALAX / GLYCOLAX) packet Take 17 g by mouth daily as needed for mild constipation or moderate constipation.  . potassium chloride (K-DUR) 10 MEQ tablet Take 1 tablet (10 mEq total) by mouth daily as needed. (Patient taking differently: Take 10 mEq by mouth daily. On Mondays, Wednesdays, and Fridays)  . ranitidine (ZANTAC) 75 MG tablet Take 75 mg by mouth daily.  Marland Kitchen senna (SENOKOT) 8.6 MG TABS tablet Take 1 tablet by mouth daily.  . sodium chloride (OCEAN) 0.65 % SOLN nasal spray Place 1 spray  into both nostrils as needed for congestion.   No facility-administered encounter medications on file as of 11/16/2015.     Orders for this visit: Orders Placed This Encounter  Procedures  . 6 minute walk    Standing Status:   Future    Standing Expiration Date:   11/15/2016    Order Specific Question:   Where should this test be performed?    Answer:   Other    Thank  you for the visitation and for allowing  [facility] to assist in the care of your patient. Our recommendations are noted above.  Please contact us if we can be of further service.  Vilinda Boehringer, MD [facility] Office Number: [phone]

## 2015-11-16 NOTE — Patient Instructions (Signed)
Follow up with Dr. Stevenson Clinch in:1 month -2 L of oxygen with physical therapy and exercise and exertion -6 minute walk test prior to follow-up visit in one month

## 2015-11-17 DIAGNOSIS — K219 Gastro-esophageal reflux disease without esophagitis: Secondary | ICD-10-CM | POA: Diagnosis not present

## 2015-11-17 DIAGNOSIS — I5189 Other ill-defined heart diseases: Secondary | ICD-10-CM | POA: Diagnosis not present

## 2015-11-17 DIAGNOSIS — I214 Non-ST elevation (NSTEMI) myocardial infarction: Secondary | ICD-10-CM | POA: Diagnosis not present

## 2015-11-17 DIAGNOSIS — I7789 Other specified disorders of arteries and arterioles: Secondary | ICD-10-CM | POA: Diagnosis not present

## 2015-11-19 ENCOUNTER — Telehealth: Payer: Self-pay | Admitting: Internal Medicine

## 2015-11-19 DIAGNOSIS — I050 Rheumatic mitral stenosis: Secondary | ICD-10-CM

## 2015-11-19 DIAGNOSIS — I110 Hypertensive heart disease with heart failure: Secondary | ICD-10-CM | POA: Diagnosis not present

## 2015-11-19 DIAGNOSIS — E7800 Pure hypercholesterolemia, unspecified: Secondary | ICD-10-CM

## 2015-11-19 DIAGNOSIS — G629 Polyneuropathy, unspecified: Secondary | ICD-10-CM | POA: Diagnosis not present

## 2015-11-19 DIAGNOSIS — I2510 Atherosclerotic heart disease of native coronary artery without angina pectoris: Secondary | ICD-10-CM | POA: Diagnosis not present

## 2015-11-19 DIAGNOSIS — M810 Age-related osteoporosis without current pathological fracture: Secondary | ICD-10-CM

## 2015-11-19 DIAGNOSIS — K219 Gastro-esophageal reflux disease without esophagitis: Secondary | ICD-10-CM | POA: Diagnosis not present

## 2015-11-19 DIAGNOSIS — I350 Nonrheumatic aortic (valve) stenosis: Secondary | ICD-10-CM

## 2015-11-19 DIAGNOSIS — I509 Heart failure, unspecified: Secondary | ICD-10-CM | POA: Diagnosis not present

## 2015-11-19 NOTE — Telephone Encounter (Signed)
Please advise 

## 2015-11-19 NOTE — Telephone Encounter (Signed)
Made the TCM call, see below as she wants a earlier appt. thanks  Transition Care Management Follow-up Telephone Call  How have you been since you were released from the hospital? Okay, she has assistance at the nursing home now so she is good.    Do you understand why you were in the hospital? Yes, no detail given.    Do you understand the discharge instrcutions? Yes, having someone help with the meds which should make things better. They are putting her meds in a container for her to be able to take them easier.   Items Reviewed:  Medications reviewed: changes made, see discharge instructions  Allergies reviewed: yes, no changes  Dietary changes reviewed: no diet changes  Referrals reviewed: Dr. Stevenson Clinch, appt made already.    Functional Questionnaire:  Activities of Daily Living (ADLs):   She states they are independent in the following: fine, no assistance needed per patient States they require assistance with the following: none  Any transportation issues/concerns?: person has to bring her now as daughter feels like she should not drive anymore.    Any patient concerns? Only regarding appt date, see below   Confirmed importance and date/time of follow-up visits scheduled: has an appt on 11/1, but would like a sooner appt if available.   Confirmed with patient if condition begins to worsen call PCP or go to the ER.  Patient was given the Call-a-Nurse line [phone]: yes

## 2015-11-19 NOTE — Telephone Encounter (Signed)
Pt called and stated that the [place] wanted patient to make an appt sooner then 11/1. Does Dr. Nicki Reaper have anything?  Call pt @ [phone]

## 2015-11-19 NOTE — Telephone Encounter (Signed)
She is in a skilled nursing facility.  The doctor at the facility has been seeing her, so she should not need a f/u appt with me.  I do not mind seeing her, but at the skilled facility the MD has been seeing her and giving orders.

## 2015-11-19 NOTE — Telephone Encounter (Signed)
Pt lm on office vm. She was just discharged from hospital and needs an appt with Dr. Nicki Reaper.

## 2015-11-20 NOTE — Telephone Encounter (Signed)
Spoke with the patient, she is going to check with the ride service they use to see if she can come at that time, will call me back. thanks

## 2015-11-20 NOTE — Telephone Encounter (Signed)
Pt called returning your call. Thank you!  Call pt @ [phone]

## 2015-11-20 NOTE — Telephone Encounter (Signed)
See if she can come in 12/26/15 - at 8:00.

## 2015-11-20 NOTE — Telephone Encounter (Signed)
Left a detailed message that the MD in the facility will do the follow up and that she can follow up, but we can hold until she is released from the facility to home.

## 2015-11-20 NOTE — Telephone Encounter (Signed)
Spoke with the patient, she is now home now. Was discharged from the facility yesterday.  She has the appt in November but would like to see you sooner if possible. Thanks

## 2015-11-23 ENCOUNTER — Telehealth: Payer: Self-pay | Admitting: Internal Medicine

## 2015-11-23 DIAGNOSIS — I2510 Atherosclerotic heart disease of native coronary artery without angina pectoris: Secondary | ICD-10-CM | POA: Diagnosis not present

## 2015-11-23 DIAGNOSIS — I350 Nonrheumatic aortic (valve) stenosis: Secondary | ICD-10-CM | POA: Diagnosis not present

## 2015-11-23 DIAGNOSIS — I050 Rheumatic mitral stenosis: Secondary | ICD-10-CM | POA: Diagnosis not present

## 2015-11-23 DIAGNOSIS — I110 Hypertensive heart disease with heart failure: Secondary | ICD-10-CM | POA: Diagnosis not present

## 2015-11-23 DIAGNOSIS — I509 Heart failure, unspecified: Secondary | ICD-10-CM | POA: Diagnosis not present

## 2015-11-23 DIAGNOSIS — G629 Polyneuropathy, unspecified: Secondary | ICD-10-CM | POA: Diagnosis not present

## 2015-11-23 NOTE — Telephone Encounter (Signed)
She will be here at 8am on the 11th, thanks Tanya

## 2015-11-23 NOTE — Telephone Encounter (Signed)
Julia Patterson from [facility] pt was seen for in home physical therapy evaluation today. Pt will be seen 2 x weeks for 5 weeks.

## 2015-11-25 ENCOUNTER — Ambulatory Visit (INDEPENDENT_AMBULATORY_CARE_PROVIDER_SITE_OTHER): Payer: Medicare Other | Admitting: Internal Medicine

## 2015-11-25 ENCOUNTER — Encounter: Payer: Self-pay | Admitting: Internal Medicine

## 2015-11-25 VITALS — BP 118/60 | HR 63 | Temp 97.7°F | Wt 129.4 lb

## 2015-11-25 DIAGNOSIS — E039 Hypothyroidism, unspecified: Secondary | ICD-10-CM

## 2015-11-25 DIAGNOSIS — I214 Non-ST elevation (NSTEMI) myocardial infarction: Secondary | ICD-10-CM | POA: Diagnosis not present

## 2015-11-25 DIAGNOSIS — R531 Weakness: Secondary | ICD-10-CM

## 2015-11-25 DIAGNOSIS — I739 Peripheral vascular disease, unspecified: Secondary | ICD-10-CM

## 2015-11-25 DIAGNOSIS — R0602 Shortness of breath: Secondary | ICD-10-CM | POA: Diagnosis not present

## 2015-11-25 DIAGNOSIS — I701 Atherosclerosis of renal artery: Secondary | ICD-10-CM

## 2015-11-25 DIAGNOSIS — Z23 Encounter for immunization: Secondary | ICD-10-CM | POA: Diagnosis not present

## 2015-11-25 DIAGNOSIS — E7800 Pure hypercholesterolemia, unspecified: Secondary | ICD-10-CM

## 2015-11-25 DIAGNOSIS — I10 Essential (primary) hypertension: Secondary | ICD-10-CM

## 2015-11-25 DIAGNOSIS — I779 Disorder of arteries and arterioles, unspecified: Secondary | ICD-10-CM

## 2015-11-25 LAB — BASIC METABOLIC PANEL
BUN: 25 mg/dL — ABNORMAL HIGH (ref 6–23)
CALCIUM: 10.4 mg/dL (ref 8.4–10.5)
CHLORIDE: 104 meq/L (ref 96–112)
CO2: 31 meq/L (ref 19–32)
CREATININE: 1.37 mg/dL — AB (ref 0.40–1.20)
GFR: 39.04 mL/min — ABNORMAL LOW (ref >60.00)
GLUCOSE: 94 mg/dL (ref 70–99)
Potassium: 3.9 mEq/L (ref 3.5–5.1)
SODIUM: 144 meq/L (ref 135–145)

## 2015-11-25 NOTE — Progress Notes (Signed)
Pre visit review using our clinic review tool, if applicable. No additional management support is needed unless otherwise documented below in the visit note. 

## 2015-11-25 NOTE — Progress Notes (Signed)
Patient ID: Julia Patterson, female   DOB: [birth year], 82 y.o.   MRN: [id number]   Subjective:    Patient ID: Julia Patterson, female    DOB: 04-Jun-1929, 82 y.o.   MRN: [id number]  HPI  Patient here for hospital and rehab follow up.  She is s/p carpal tunnel release recently.  After surgery, noticed weakness and difficulty walking.  Was admitted 09/23/15 with NSTEMI.  S/p angioplasty and stent placement - RCA.  Went to rehab.  Readmitted with syncope.  Was having diarrhea and was dehydrated.  She was evaluated by Dr Rockey Situ in the office.  Noted to have worsening lower extremity edema and some sob.  Weight was noticed to be increased some (6 pounds).  Had recent echo - normal LV function with mildly elevated RVSP.  He placed her on lasix - to continue three days per week.  She was evaluated by Dr Fletcher Anon recently.  No changes made.  She reports her breathing is better.  Was evaluated by Dr Stevenson Clinch 11/16/15.  He evaluated her and recommended O2 with more significant exertion and with physical therapy.  She is now walking with her walker.  Eating better.  No chest pain.  No increased cough or congestion.  No abdominal pain or cramping.  Bowels stable.     Past Medical History:  Diagnosis Date  . CAD in native artery    a. cath 09/24/15: p-mLAD 20%, pRCA 30%, mRCA 80% s/p PCI/DES 0%, nl EF by echo, LVEDP mod elevated  . Carotid artery occlusion   . Degenerative arthritis    lumbar spine.  spondylolisthesis, hip and leg pain, scoliosis  . Dyspnea on exertion    a. low-risk NST in 02/2014 b. echo 09/2015: EF 55% to 60%, no WMA, Grade 1 DD, mild AS, mild to moderate MS, mild MR, PA peak pressure of 45 mm Hg (S)  . GERD (gastroesophageal reflux disease)   . History of hiatal hernia   . Hypercholesterolemia   . Hypertension   . Hypothyroidism   . Nausea   . NSTEMI (non-ST elevated myocardial infarction) ([place]) 09/23/2015  . Osteoporosis    s/p fosamax, boniva, reclast, forteo.  bilateral  spontaneous femur fractures  . Recurrent cystitis   . Shortness of breath dyspnea   . Urinary incontinence   . Varicose veins    chronic venous insufficiency   Past Surgical History:  Procedure Laterality Date  . ABDOMINAL HYSTERECTOMY    . BREAST REDUCTION SURGERY  1987  . CARDIAC CATHETERIZATION N/A 09/24/2015   Procedure: Left Heart Cath and Coronary Angiography;  Surgeon: Wellington Hampshire, MD;  Location: [facility];  Service: Cardiovascular;  Laterality: N/A;  . CARDIAC CATHETERIZATION N/A 09/24/2015   Procedure: Coronary Stent Intervention;  Surgeon: Wellington Hampshire, MD;  Location: [facility];  Service: Cardiovascular;  Laterality: N/A;  . CARPAL TUNNEL RELEASE Left 09/21/2015   Procedure: CARPAL TUNNEL RELEASE;  Surgeon: Earnestine Leys, MD;  Location: [facility];  Service: Orthopedics;  Laterality: Left;  . hysterectomy and bladder repair  1998  . L5-L6 fusion  2/07  . TONSILLECTOMY AND ADENOIDECTOMY  1950  . trigger finger repair     right fourth  . uterus suspension and sterilization  1963  . varicose vein ligation  1972  . varicose vein sclerotherapy     Dr Hulda Humphrey   Family History  Problem Relation Age of Onset  . Rheumatic fever Mother     died age 84  .  Heart attack Father   . Breast cancer Daughter   . Heart disease      father's family  . Diabetes      father's family   Social History   Social History  . Marital status: Single    Spouse name: N/A  . Number of children: N/A  . Years of education: N/A   Social History Main Topics  . Smoking status: Never Smoker  . Smokeless tobacco: Never Used  . Alcohol use No     Comment: rarely  . Drug use: No  . Sexual activity: Not Asked   Other Topics Concern  . None   Social History Narrative  . None    Outpatient Encounter Prescriptions as of 11/25/2015  Medication Sig  . acetaminophen (TYLENOL) 325 MG tablet Take 650 mg by mouth every 6 (six) hours as needed.  Marland Kitchen acetaminophen (TYLENOL)  500 MG chewable tablet Chew 500 mg by mouth every 6 (six) hours as needed for pain.  Marland Kitchen antiseptic oral rinse (BIOTENE) LIQD 15 mLs by Mouth Rinse route as needed for dry mouth.  Marland Kitchen aspirin EC 81 MG tablet Take 81 mg by mouth daily.   Marland Kitchen atorvastatin (LIPITOR) 40 MG tablet Take 1 tablet (40 mg total) by mouth daily at 6 PM.  . calcium-vitamin D (OSCAL WITH D) 500-200 MG-UNIT tablet Take 1 tablet by mouth 2 (two) times daily.  . carvedilol (COREG) 3.125 MG tablet Take 1 tablet (3.125 mg total) by mouth 2 (two) times daily.  . Cholecalciferol (VITAMIN D-1000 MAX ST) 1000 UNITS tablet Take 1,000 Units by mouth daily.   . clopidogrel (PLAVIX) 75 MG tablet Take 1 tablet (75 mg total) by mouth daily with breakfast.  . Coenzyme Q10 100 MG capsule Take 100 mg by mouth daily as needed.  . fluticasone (FLONASE) 50 MCG/ACT nasal spray Place 1 spray into both nostrils daily.   . furosemide (LASIX) 20 MG tablet Take 1 tablet (20 mg total) by mouth daily as needed. (Patient taking differently: Take 30 mg by mouth daily. )  . gabapentin (NEURONTIN) 400 MG capsule Take 1 capsule (400 mg total) by mouth 3 (three) times daily.  . Melatonin 3 MG TABS Take 3 mg by mouth at bedtime.   . midodrine (PROAMATINE) 2.5 MG tablet Take 1 tablet (2.5 mg total) by mouth 3 (three) times daily with meals.  . Multiple Vitamin (MULTI-VITAMINS) TABS Take 1 tablet by mouth daily.   Marland Kitchen nystatin cream (MYCOSTATIN) Apply topically 2 (two) times daily.  . ondansetron (ZOFRAN) 4 MG tablet Take 4 mg by mouth every 8 (eight) hours as needed for nausea or vomiting.  . OXYGEN Inhale 2 L into the lungs daily.  . polyethylene glycol (MIRALAX / GLYCOLAX) packet Take 17 g by mouth daily as needed for mild constipation or moderate constipation.  . potassium chloride (K-DUR) 10 MEQ tablet Take 1 tablet (10 mEq total) by mouth daily as needed. (Patient taking differently: Take 10 mEq by mouth daily. On Mondays, Wednesdays, and Fridays)  . ranitidine  (ZANTAC) 75 MG tablet Take 75 mg by mouth daily.  Marland Kitchen senna (SENOKOT) 8.6 MG TABS tablet Take 1 tablet by mouth daily.  . sodium chloride (OCEAN) 0.65 % SOLN nasal spray Place 1 spray into both nostrils as needed for congestion.  . [DISCONTINUED] levothyroxine (SYNTHROID, LEVOTHROID) 88 MCG tablet Take 88 mcg by mouth daily before breakfast.  . [DISCONTINUED] oxybutynin (DITROPAN) 5 MG tablet TAKE 1 TABLET TWICE DAILY   No facility-administered  encounter medications on file as of 11/25/2015.     Review of Systems  Constitutional: Negative for appetite change and unexpected weight change.  HENT: Negative for congestion and sinus pressure.   Respiratory: Negative for cough and chest tightness.        Breathing improved.    Cardiovascular: Negative for chest pain, palpitations and leg swelling.  Gastrointestinal: Negative for abdominal pain, diarrhea, nausea and vomiting.  Genitourinary: Negative for difficulty urinating and dysuria.  Musculoskeletal: Negative for joint swelling and myalgias.  Skin: Negative for color change and rash.  Neurological: Negative for dizziness, light-headedness and headaches.  Psychiatric/Behavioral: Negative for agitation and dysphoric mood.       Objective:    Physical Exam  Constitutional: She appears well-developed and well-nourished. No distress.  HENT:  Nose: Nose normal.  Mouth/Throat: Oropharynx is clear and moist.  Neck: Neck supple. No thyromegaly present.  Cardiovascular: Normal rate and regular rhythm.   Pulmonary/Chest: Breath sounds normal. No respiratory distress. She has no wheezes.  Abdominal: Soft. Bowel sounds are normal. There is no tenderness.  Musculoskeletal: She exhibits no edema or tenderness.  Lymphadenopathy:    She has no cervical adenopathy.  Skin: No rash noted. No erythema.  Psychiatric: She has a normal mood and affect. Her behavior is normal.    BP 118/60   Pulse 63   Temp 97.7 F (36.5 C) (Oral)   Wt 129 lb 6.4 oz  (58.7 kg)   SpO2 97%   BMI 26.14 kg/m  Wt Readings from Last 3 Encounters:  11/25/15 129 lb 6.4 oz (58.7 kg)  11/16/15 129 lb (58.5 kg)  11/10/15 148 lb (67.1 kg)     Lab Results  Component Value Date   WBC 9.1 10/13/2015   HGB 14.2 10/13/2015   HCT 41.1 10/13/2015   PLT 218 10/13/2015   GLUCOSE 94 11/25/2015   CHOL 127 09/24/2015   TRIG 111 09/24/2015   HDL 32 (L) 09/24/2015   LDLCALC 73 09/24/2015   ALT 13 (L) 10/13/2015   AST 18 10/13/2015   NA 144 11/25/2015   K 3.9 11/25/2015   CL 104 11/25/2015   CREATININE 1.37 (H) 11/25/2015   BUN 25 (H) 11/25/2015   CO2 31 11/25/2015   TSH 2.08 05/27/2015   INR 1.10 09/23/2015       Assessment & Plan:   Problem List Items Addressed This Visit    Carotid artery disease ([place])    Followed by Dr Lucky Cowboy.        Essential hypertension, benign - Primary    Blood pressure as outlined.  Appears to be doing better.  Follow.  Follow metabolic panel.       Relevant Orders   Basic metabolic panel (Completed)   Hypercholesterolemia    On lipitor.  Follow lipid panel and liver function tests.        Hypothyroidism    On synthroid.  Follow tsh.       NSTEMI (non-ST elevated myocardial infarction) [facility])    Recently admitted with NSTEMI.  Seeing cardiology.  Continue plavix and aspirin.  Just saw Dr Fletcher Anon.  No changes made.  Continue risk factor modification.        Renal artery stenosis ([facility])    Followed by [facility].       SOB (shortness of breath)    Just evaluated by cardiology and pulmonary.  See notes.  Recommended oxygen with increased exertion and physical therapy.  Breathing overall improved.  Follow.  Weakness    Improving.  Walking with walker.  Follow.        Other Visit Diagnoses    Encounter for immunization       Relevant Orders   Flu vaccine HIGH DOSE PF (Completed)       Einar Pheasant, MD

## 2015-11-26 ENCOUNTER — Encounter: Payer: Self-pay | Admitting: *Deleted

## 2015-11-26 DIAGNOSIS — I110 Hypertensive heart disease with heart failure: Secondary | ICD-10-CM | POA: Diagnosis not present

## 2015-11-26 DIAGNOSIS — G629 Polyneuropathy, unspecified: Secondary | ICD-10-CM | POA: Diagnosis not present

## 2015-11-26 DIAGNOSIS — I2510 Atherosclerotic heart disease of native coronary artery without angina pectoris: Secondary | ICD-10-CM | POA: Diagnosis not present

## 2015-11-26 DIAGNOSIS — I509 Heart failure, unspecified: Secondary | ICD-10-CM | POA: Diagnosis not present

## 2015-11-26 DIAGNOSIS — I050 Rheumatic mitral stenosis: Secondary | ICD-10-CM | POA: Diagnosis not present

## 2015-11-26 DIAGNOSIS — I350 Nonrheumatic aortic (valve) stenosis: Secondary | ICD-10-CM | POA: Diagnosis not present

## 2015-11-28 DIAGNOSIS — I050 Rheumatic mitral stenosis: Secondary | ICD-10-CM | POA: Diagnosis not present

## 2015-11-28 DIAGNOSIS — I350 Nonrheumatic aortic (valve) stenosis: Secondary | ICD-10-CM | POA: Diagnosis not present

## 2015-11-28 DIAGNOSIS — I110 Hypertensive heart disease with heart failure: Secondary | ICD-10-CM | POA: Diagnosis not present

## 2015-11-28 DIAGNOSIS — G629 Polyneuropathy, unspecified: Secondary | ICD-10-CM | POA: Diagnosis not present

## 2015-11-28 DIAGNOSIS — I509 Heart failure, unspecified: Secondary | ICD-10-CM | POA: Diagnosis not present

## 2015-11-28 DIAGNOSIS — I2510 Atherosclerotic heart disease of native coronary artery without angina pectoris: Secondary | ICD-10-CM | POA: Diagnosis not present

## 2015-11-30 ENCOUNTER — Telehealth: Payer: Self-pay | Admitting: Internal Medicine

## 2015-11-30 NOTE — Telephone Encounter (Signed)
Pt called requesting a medication refill for oxybutynin (DITROPAN) 5 MG tablet, levothyroxine (SYNTHROID, LEVOTHROID) 88 MCG tablet. Pt is unsure if she should be taking midodrine (PROAMATINE) 2.5 MG tablet, she does not remember taking this medication. Should she be taking this? If she does in fact need to take this medication she needs this refilled as well. Please advise, thank you!  [facility] - [place], [place]  Call pt @ [phone]

## 2015-11-30 NOTE — Telephone Encounter (Signed)
Please advise 

## 2015-12-01 ENCOUNTER — Other Ambulatory Visit: Payer: Self-pay

## 2015-12-01 ENCOUNTER — Telehealth: Payer: Self-pay | Admitting: Cardiovascular Disease

## 2015-12-01 DIAGNOSIS — N3281 Overactive bladder: Secondary | ICD-10-CM

## 2015-12-01 MED ORDER — OXYBUTYNIN CHLORIDE 5 MG PO TABS: 5.0000 mg | ORAL_TABLET | Freq: Two times a day (BID) | ORAL | 3 refills | Status: DC

## 2015-12-01 MED ORDER — LEVOTHYROXINE SODIUM 88 MCG PO TABS: 88.0000 ug | ORAL_TABLET | Freq: Every day | ORAL | 1 refills | Status: DC

## 2015-12-01 NOTE — Progress Notes (Signed)
Patient notified and will contact cardiology for the other medication refill

## 2015-12-01 NOTE — Telephone Encounter (Signed)
Pt states she was D/C from [facility], and her medicaiton Midodrine, she asks if she should still take this. States it was on her medication list, and she is not sure who prescribed it. Please call.

## 2015-12-01 NOTE — Telephone Encounter (Signed)
I am ok to refill her ditropan and levothyroxine.  Ok to send in refills x 3 months.  Regarding the midodrine, I did not prescribe this medication.  She needs to call cardiology and see if she is to remain on this medication.  She sees Dr Fletcher Anon.  Let me know if any problems.

## 2015-12-01 NOTE — Telephone Encounter (Signed)
Pt reports being discharged from [facility], is now at home. States she has not been taking midodrine and BP has been WNL. Midodrine 2.5mg  TID was prescribed in August by hospitalist, Dr. Posey Pronto.  Advised pt to monitor BP daily and report if she becomes hypotensive. She will check with son-in-law about  BP cuff to use at home. She verbalized understanding and has no further questions at this time.

## 2015-12-03 DIAGNOSIS — I509 Heart failure, unspecified: Secondary | ICD-10-CM | POA: Diagnosis not present

## 2015-12-03 DIAGNOSIS — G629 Polyneuropathy, unspecified: Secondary | ICD-10-CM | POA: Diagnosis not present

## 2015-12-03 DIAGNOSIS — I2510 Atherosclerotic heart disease of native coronary artery without angina pectoris: Secondary | ICD-10-CM | POA: Diagnosis not present

## 2015-12-03 DIAGNOSIS — I110 Hypertensive heart disease with heart failure: Secondary | ICD-10-CM | POA: Diagnosis not present

## 2015-12-03 DIAGNOSIS — I050 Rheumatic mitral stenosis: Secondary | ICD-10-CM | POA: Diagnosis not present

## 2015-12-03 DIAGNOSIS — I350 Nonrheumatic aortic (valve) stenosis: Secondary | ICD-10-CM | POA: Diagnosis not present

## 2015-12-05 DIAGNOSIS — I350 Nonrheumatic aortic (valve) stenosis: Secondary | ICD-10-CM | POA: Diagnosis not present

## 2015-12-05 DIAGNOSIS — G629 Polyneuropathy, unspecified: Secondary | ICD-10-CM | POA: Diagnosis not present

## 2015-12-05 DIAGNOSIS — I509 Heart failure, unspecified: Secondary | ICD-10-CM | POA: Diagnosis not present

## 2015-12-05 DIAGNOSIS — I050 Rheumatic mitral stenosis: Secondary | ICD-10-CM | POA: Diagnosis not present

## 2015-12-05 DIAGNOSIS — I2510 Atherosclerotic heart disease of native coronary artery without angina pectoris: Secondary | ICD-10-CM | POA: Diagnosis not present

## 2015-12-05 DIAGNOSIS — I110 Hypertensive heart disease with heart failure: Secondary | ICD-10-CM | POA: Diagnosis not present

## 2015-12-06 ENCOUNTER — Encounter: Payer: Self-pay | Admitting: Internal Medicine

## 2015-12-06 NOTE — Assessment & Plan Note (Signed)
Followed by [facility].   

## 2015-12-06 NOTE — Assessment & Plan Note (Signed)
Recently admitted with NSTEMI.  Seeing cardiology.  Continue plavix and aspirin.  Just saw Dr Fletcher Anon.  No changes made.  Continue risk factor modification.

## 2015-12-06 NOTE — Assessment & Plan Note (Signed)
Followed by Dr Dew.   

## 2015-12-06 NOTE — Assessment & Plan Note (Signed)
Just evaluated by cardiology and pulmonary.  See notes.  Recommended oxygen with increased exertion and physical therapy.  Breathing overall improved.  Follow.

## 2015-12-06 NOTE — Assessment & Plan Note (Signed)
Blood pressure as outlined.  Appears to be doing better.  Follow.  Follow metabolic panel.

## 2015-12-06 NOTE — Assessment & Plan Note (Signed)
On synthroid.  Follow tsh.   

## 2015-12-06 NOTE — Assessment & Plan Note (Signed)
Improving.  Walking with walker.  Follow.

## 2015-12-06 NOTE — Assessment & Plan Note (Signed)
On lipitor.  Follow lipid panel and liver function tests.   

## 2015-12-08 DIAGNOSIS — I050 Rheumatic mitral stenosis: Secondary | ICD-10-CM | POA: Diagnosis not present

## 2015-12-08 DIAGNOSIS — I350 Nonrheumatic aortic (valve) stenosis: Secondary | ICD-10-CM | POA: Diagnosis not present

## 2015-12-08 DIAGNOSIS — I2510 Atherosclerotic heart disease of native coronary artery without angina pectoris: Secondary | ICD-10-CM | POA: Diagnosis not present

## 2015-12-08 DIAGNOSIS — I509 Heart failure, unspecified: Secondary | ICD-10-CM | POA: Diagnosis not present

## 2015-12-08 DIAGNOSIS — I110 Hypertensive heart disease with heart failure: Secondary | ICD-10-CM | POA: Diagnosis not present

## 2015-12-08 DIAGNOSIS — G629 Polyneuropathy, unspecified: Secondary | ICD-10-CM | POA: Diagnosis not present

## 2015-12-10 DIAGNOSIS — I509 Heart failure, unspecified: Secondary | ICD-10-CM | POA: Diagnosis not present

## 2015-12-10 DIAGNOSIS — G629 Polyneuropathy, unspecified: Secondary | ICD-10-CM | POA: Diagnosis not present

## 2015-12-10 DIAGNOSIS — I2510 Atherosclerotic heart disease of native coronary artery without angina pectoris: Secondary | ICD-10-CM | POA: Diagnosis not present

## 2015-12-10 DIAGNOSIS — I050 Rheumatic mitral stenosis: Secondary | ICD-10-CM | POA: Diagnosis not present

## 2015-12-10 DIAGNOSIS — I110 Hypertensive heart disease with heart failure: Secondary | ICD-10-CM | POA: Diagnosis not present

## 2015-12-10 DIAGNOSIS — I350 Nonrheumatic aortic (valve) stenosis: Secondary | ICD-10-CM | POA: Diagnosis not present

## 2015-12-15 ENCOUNTER — Ambulatory Visit (INDEPENDENT_AMBULATORY_CARE_PROVIDER_SITE_OTHER): Payer: Medicare Other | Admitting: *Deleted

## 2015-12-15 DIAGNOSIS — I2510 Atherosclerotic heart disease of native coronary artery without angina pectoris: Secondary | ICD-10-CM | POA: Diagnosis not present

## 2015-12-15 DIAGNOSIS — I050 Rheumatic mitral stenosis: Secondary | ICD-10-CM | POA: Diagnosis not present

## 2015-12-15 DIAGNOSIS — R0602 Shortness of breath: Secondary | ICD-10-CM

## 2015-12-15 DIAGNOSIS — G629 Polyneuropathy, unspecified: Secondary | ICD-10-CM | POA: Diagnosis not present

## 2015-12-15 DIAGNOSIS — I350 Nonrheumatic aortic (valve) stenosis: Secondary | ICD-10-CM | POA: Diagnosis not present

## 2015-12-15 DIAGNOSIS — I110 Hypertensive heart disease with heart failure: Secondary | ICD-10-CM | POA: Diagnosis not present

## 2015-12-15 DIAGNOSIS — I509 Heart failure, unspecified: Secondary | ICD-10-CM | POA: Diagnosis not present

## 2015-12-15 NOTE — Progress Notes (Signed)
SMW performed today. 

## 2015-12-16 ENCOUNTER — Encounter (INDEPENDENT_AMBULATORY_CARE_PROVIDER_SITE_OTHER): Payer: Self-pay

## 2015-12-16 ENCOUNTER — Ambulatory Visit (INDEPENDENT_AMBULATORY_CARE_PROVIDER_SITE_OTHER): Payer: Medicare Other | Admitting: Internal Medicine

## 2015-12-16 ENCOUNTER — Encounter: Payer: Self-pay | Admitting: Internal Medicine

## 2015-12-16 DIAGNOSIS — I701 Atherosclerosis of renal artery: Secondary | ICD-10-CM

## 2015-12-16 DIAGNOSIS — R0602 Shortness of breath: Secondary | ICD-10-CM | POA: Diagnosis not present

## 2015-12-16 DIAGNOSIS — R531 Weakness: Secondary | ICD-10-CM | POA: Diagnosis not present

## 2015-12-16 DIAGNOSIS — I10 Essential (primary) hypertension: Secondary | ICD-10-CM

## 2015-12-16 DIAGNOSIS — I739 Peripheral vascular disease, unspecified: Secondary | ICD-10-CM

## 2015-12-16 DIAGNOSIS — I779 Disorder of arteries and arterioles, unspecified: Secondary | ICD-10-CM

## 2015-12-16 DIAGNOSIS — R7989 Other specified abnormal findings of blood chemistry: Secondary | ICD-10-CM

## 2015-12-16 DIAGNOSIS — I214 Non-ST elevation (NSTEMI) myocardial infarction: Secondary | ICD-10-CM

## 2015-12-16 DIAGNOSIS — E7800 Pure hypercholesterolemia, unspecified: Secondary | ICD-10-CM

## 2015-12-16 DIAGNOSIS — K219 Gastro-esophageal reflux disease without esophagitis: Secondary | ICD-10-CM

## 2015-12-16 DIAGNOSIS — E039 Hypothyroidism, unspecified: Secondary | ICD-10-CM

## 2015-12-16 MED ORDER — ATORVASTATIN CALCIUM 40 MG PO TABS: 40.0000 mg | ORAL_TABLET | Freq: Every day | ORAL | 0 refills | Status: DC

## 2015-12-16 NOTE — Progress Notes (Signed)
Pre visit review using our clinic review tool, if applicable. No additional management support is needed unless otherwise documented below in the visit note. 

## 2015-12-16 NOTE — Patient Instructions (Signed)
Hold lasix (furosemide) - fluid pill  Hold potassium  Stop pravastatin and start atorvastatin (lipitor).  Prescription sent in to pharmacy.

## 2015-12-16 NOTE — Progress Notes (Signed)
Patient ID: Julia Patterson, female   DOB: 12/26/1929, 82 y.o.   MRN: [id number]   Subjective:    Patient ID: Julia Patterson, female    DOB: 13-Dec-1929, 82 y.o.   MRN: [id number]  HPI  Patient here for a scheduled follow up.  Was here for hospital f/u on 11/25/15.  Admitted 09/23/15 - NSTEMI.  Is s/p angioplasty and stent placement - RCA.  Is living at home now.  States energy is better.  Breathing better.  Eating better.  No nausea or vomiting.  Bowels stable.  No abdominal pain or cramping.  Bowels stable.  She had questions about her medication.  Was not taking her medications that she was discharged on - from rehab.  Went over her list.  She has not been on midodrine.  Was taking pravastatin instead of lipitor.  Discussed stopping pravastatin and staring lipitor.   She is having problems with increased urination and swelling has improved.  Wants to stop the lasix on a regular basis.     Past Medical History:  Diagnosis Date  . CAD in native artery    a. cath 09/24/15: p-mLAD 20%, pRCA 30%, mRCA 80% s/p PCI/DES 0%, nl EF by echo, LVEDP mod elevated  . Carotid artery occlusion   . Degenerative arthritis    lumbar spine.  spondylolisthesis, hip and leg pain, scoliosis  . Dyspnea on exertion    a. low-risk NST in 02/2014 b. echo 09/2015: EF 55% to 60%, no WMA, Grade 1 DD, mild AS, mild to moderate MS, mild MR, PA peak pressure of 45 mm Hg (S)  . GERD (gastroesophageal reflux disease)   . History of hiatal hernia   . Hypercholesterolemia   . Hypertension   . Hypothyroidism   . Nausea   . NSTEMI (non-ST elevated myocardial infarction) ([place]) 09/23/2015  . Osteoporosis    s/p fosamax, boniva, reclast, forteo.  bilateral spontaneous femur fractures  . Recurrent cystitis   . Shortness of breath dyspnea   . Urinary incontinence   . Varicose veins    chronic venous insufficiency   Past Surgical History:  Procedure Laterality Date  . ABDOMINAL HYSTERECTOMY    . BREAST REDUCTION  SURGERY  1987  . CARDIAC CATHETERIZATION N/A 09/24/2015   Procedure: Left Heart Cath and Coronary Angiography;  Surgeon: Wellington Hampshire, MD;  Location: [facility];  Service: Cardiovascular;  Laterality: N/A;  . CARDIAC CATHETERIZATION N/A 09/24/2015   Procedure: Coronary Stent Intervention;  Surgeon: Wellington Hampshire, MD;  Location: [facility];  Service: Cardiovascular;  Laterality: N/A;  . CARPAL TUNNEL RELEASE Left 09/21/2015   Procedure: CARPAL TUNNEL RELEASE;  Surgeon: Earnestine Leys, MD;  Location: [facility];  Service: Orthopedics;  Laterality: Left;  . hysterectomy and bladder repair  1998  . L5-L6 fusion  2/07  . TONSILLECTOMY AND ADENOIDECTOMY  1950  . trigger finger repair     right fourth  . uterus suspension and sterilization  1963  . varicose vein ligation  1972  . varicose vein sclerotherapy     Dr Hulda Humphrey   Family History  Problem Relation Age of Onset  . Rheumatic fever Mother     died age 35  . Heart attack Father   . Breast cancer Daughter   . Heart disease      father's family  . Diabetes      father's family   Social History   Social History  . Marital status: Single  Spouse name: N/A  . Number of children: N/A  . Years of education: N/A   Social History Main Topics  . Smoking status: Never Smoker  . Smokeless tobacco: Never Used  . Alcohol use No     Comment: rarely  . Drug use: No  . Sexual activity: Not Asked   Other Topics Concern  . None   Social History Narrative  . None    Outpatient Encounter Prescriptions as of 12/16/2015  Medication Sig  . antiseptic oral rinse (BIOTENE) LIQD 15 mLs by Mouth Rinse route as needed for dry mouth.  Marland Kitchen aspirin EC 81 MG tablet Take 81 mg by mouth daily.   Marland Kitchen atorvastatin (LIPITOR) 40 MG tablet Take 1 tablet (40 mg total) by mouth daily at 6 PM.  . calcium-vitamin D (OSCAL WITH D) 500-200 MG-UNIT tablet Take 1 tablet by mouth 2 (two) times daily.  . carvedilol (COREG) 3.125 MG tablet Take 1  tablet (3.125 mg total) by mouth 2 (two) times daily.  . Cholecalciferol (VITAMIN D-1000 MAX ST) 1000 UNITS tablet Take 1,000 Units by mouth daily.   . clopidogrel (PLAVIX) 75 MG tablet Take 1 tablet (75 mg total) by mouth daily with breakfast.  . Coenzyme Q10 100 MG capsule Take 100 mg by mouth daily as needed.  . fluticasone (FLONASE) 50 MCG/ACT nasal spray Place 1 spray into both nostrils daily.   Marland Kitchen gabapentin (NEURONTIN) 400 MG capsule Take 1 capsule (400 mg total) by mouth 3 (three) times daily.  Marland Kitchen levothyroxine (SYNTHROID, LEVOTHROID) 88 MCG tablet Take 1 tablet (88 mcg total) by mouth daily before breakfast.  . Melatonin 3 MG TABS Take 3 mg by mouth at bedtime.   . Multiple Vitamin (MULTI-VITAMINS) TABS Take 1 tablet by mouth daily.   Marland Kitchen nystatin cream (MYCOSTATIN) Apply topically 2 (two) times daily.  . ondansetron (ZOFRAN) 4 MG tablet Take 4 mg by mouth every 8 (eight) hours as needed for nausea or vomiting.  Marland Kitchen oxybutynin (DITROPAN) 5 MG tablet Take 1 tablet (5 mg total) by mouth 2 (two) times daily.  . OXYGEN Inhale 2 L into the lungs daily.  . polyethylene glycol (MIRALAX / GLYCOLAX) packet Take 17 g by mouth daily as needed for mild constipation or moderate constipation.  . ranitidine (ZANTAC) 75 MG tablet Take 75 mg by mouth daily.  Marland Kitchen senna (SENOKOT) 8.6 MG TABS tablet Take 1 tablet by mouth daily.  . sodium chloride (OCEAN) 0.65 % SOLN nasal spray Place 1 spray into both nostrils as needed for congestion.  . [DISCONTINUED] acetaminophen (TYLENOL) 325 MG tablet Take 650 mg by mouth every 6 (six) hours as needed.  . [DISCONTINUED] acetaminophen (TYLENOL) 500 MG chewable tablet Chew 500 mg by mouth every 6 (six) hours as needed for pain.  . [DISCONTINUED] atorvastatin (LIPITOR) 40 MG tablet Take 1 tablet (40 mg total) by mouth daily at 6 PM.  . [DISCONTINUED] atorvastatin (LIPITOR) 40 MG tablet Take 1 tablet (40 mg total) by mouth daily at 6 PM.  . [DISCONTINUED] furosemide (LASIX) 20  MG tablet Take 1 tablet (20 mg total) by mouth daily as needed. (Patient taking differently: Take 30 mg by mouth daily. )  . [DISCONTINUED] midodrine (PROAMATINE) 2.5 MG tablet Take 1 tablet (2.5 mg total) by mouth 3 (three) times daily with meals.  . [DISCONTINUED] potassium chloride (K-DUR) 10 MEQ tablet Take 1 tablet (10 mEq total) by mouth daily as needed. (Patient taking differently: Take 10 mEq by mouth daily. On Mondays, Wednesdays,  and Fridays)   No facility-administered encounter medications on file as of 12/16/2015.     Review of Systems  Constitutional: Negative for unexpected weight change.       Eating better.  Energy improved.    HENT: Negative for congestion and sinus pressure.   Respiratory: Negative for cough and chest tightness.        Breathing stable.    Cardiovascular: Negative for chest pain, palpitations and leg swelling.  Gastrointestinal: Negative for abdominal pain, diarrhea, nausea and vomiting.  Musculoskeletal: Negative for joint swelling and myalgias.  Neurological: Negative for dizziness, light-headedness and headaches.  Psychiatric/Behavioral: Negative for agitation and dysphoric mood.       Objective:    Physical Exam  Constitutional: She appears well-developed and well-nourished. No distress.  HENT:  Nose: Nose normal.  Mouth/Throat: Oropharynx is clear and moist.  Neck: Neck supple. No thyromegaly present.  Cardiovascular: Normal rate and regular rhythm.   Pulmonary/Chest: Breath sounds normal. No respiratory distress. She has no wheezes.  Abdominal: Soft. Bowel sounds are normal. There is no tenderness.  Musculoskeletal: She exhibits no edema or tenderness.  Lymphadenopathy:    She has no cervical adenopathy.  Skin: No rash noted. No erythema.  Psychiatric: She has a normal mood and affect. Her behavior is normal.    BP 122/80   Pulse 68   Temp 97.4 F (36.3 C) (Oral)   Ht 4\' 11"  (1.499 m)   Wt 129 lb 12.8 oz (58.9 kg)   SpO2 95%   BMI  26.22 kg/m  Wt Readings from Last 3 Encounters:  12/17/15 130 lb (59 kg)  12/16/15 129 lb 12.8 oz (58.9 kg)  11/25/15 129 lb 6.4 oz (58.7 kg)     Lab Results  Component Value Date   WBC 9.1 10/13/2015   HGB 14.2 10/13/2015   HCT 41.1 10/13/2015   PLT 218 10/13/2015   GLUCOSE 94 11/25/2015   CHOL 127 09/24/2015   TRIG 111 09/24/2015   HDL 32 (L) 09/24/2015   LDLCALC 73 09/24/2015   ALT 13 (L) 10/13/2015   AST 18 10/13/2015   NA 144 11/25/2015   K 3.9 11/25/2015   CL 104 11/25/2015   CREATININE 1.37 (H) 11/25/2015   BUN 25 (H) 11/25/2015   CO2 31 11/25/2015   TSH 2.08 05/27/2015   INR 1.10 09/23/2015       Assessment & Plan:   Problem List Items Addressed This Visit    Carotid artery disease ([place])    Followed by Dr Lucky Cowboy.       Relevant Medications   atorvastatin (LIPITOR) 40 MG tablet   Elevated serum creatinine    Increased from prior checks when compared to a couple of months ago.  Will have her hold lasix.  Follow renal function.  Swelling improved.  Follow.        Essential hypertension, benign    Blood pressure doing well.  Wants to stop taking lasix on a regular basis.  No swelling.  Blood pressure stable.  Breathing improve.  Will have her hold lasix.  Follow.        Relevant Medications   atorvastatin (LIPITOR) 40 MG tablet   GERD (gastroesophageal reflux disease)    Controlled on zantac.       Hypercholesterolemia    Has been taking pravastatin.  Was discharged on lipitor.  Have her stop pravastatin.  Take lipitor.  Follow cholesterol levels.        Relevant Medications   atorvastatin (  LIPITOR) 40 MG tablet   Hypothyroidism    On thyroid replacement.  Follow tsh.       NSTEMI (non-ST elevated myocardial infarction) [facility])    Recently admitted with NSTEMI.  Followed by cardiology.  Continue plavix and aspirin.        Relevant Medications   atorvastatin (LIPITOR) 40 MG tablet   Renal artery stenosis ([facility])    Followed by [facility].         Relevant Medications   atorvastatin (LIPITOR) 40 MG tablet   SOB (shortness of breath)    Breathing better.  Not requiring O2 now.  Continue f/u with pulmonary and cardiology.        Weakness    Improved.  Doing better.  Follow.            Einar Pheasant, MD

## 2015-12-17 ENCOUNTER — Ambulatory Visit (INDEPENDENT_AMBULATORY_CARE_PROVIDER_SITE_OTHER): Payer: Medicare Other | Admitting: Internal Medicine

## 2015-12-17 ENCOUNTER — Ambulatory Visit: Payer: Medicare Other | Admitting: Internal Medicine

## 2015-12-17 ENCOUNTER — Encounter: Payer: Self-pay | Admitting: Internal Medicine

## 2015-12-17 DIAGNOSIS — I701 Atherosclerosis of renal artery: Secondary | ICD-10-CM | POA: Diagnosis not present

## 2015-12-17 DIAGNOSIS — R0602 Shortness of breath: Secondary | ICD-10-CM

## 2015-12-17 NOTE — Patient Instructions (Signed)
Follow up with Dr. Alva Garnet in 4 months - cont with BP control - cont with exercise as tolerated - we will stop your supplemental O2 orders.  - no further need for additional oxygen.

## 2015-12-17 NOTE — Progress Notes (Signed)
MRN# [id number] VANIYA HILLSMAN August 20, 1929   CC: Chief Complaint  Patient presents with  . Follow-up    NP cough; no SOB or Chest tightness      Brief History: 82 yo F with PMHx of large hiatal hernia, allergies, NSTEMI s/p RCA PCI 09/2015, left heat disease, ? Chronic micro aspiration, followed for dyspnea. HRCT with NO significant ILD, mild aspiration findings at the base.  PFTs with mild restriction, severely decreased DLCO.     Events since last clinic visit: Presents today for follow-up of her hypoxia after her cardiac event. History since her last visit show that she had a NSTEMI, now status post RCA PCI with drug eluding stent placement in August 2017, and on dual antiplatelet therapy for 1 year dental August 2018. She was previously admitted for cardiac rehabilitation at [facility]. While during that admission, she was noted to have desats fairly for admission with beginning of walk and at end of exercise. However at her follow-up office visit last month she had a resting O2 saturation greater than 90%. Patient did have a 6 minute walk test performed on 12/15/2015, only able to walk about 132 m and had to stop due to leg pain but not due to dyspnea. After her cardiac event she was placed on 2 L of nasal cannula. Today denies any significant shortness of breath at rest, fever, chills, cough, wheezing. Patient states that since her last visit, she has not been using her oxygen with exertion or at night, and has not seen a clinical difference either; she is not feeling worst. She states that she doesn't apartment complex, at times she does take the stairs to her apartment, which is 18 stairs, no significant dyspnea when she completes this, per patient.   Medication:    Current Outpatient Prescriptions:  .  antiseptic oral rinse (BIOTENE) LIQD, 15 mLs by Mouth Rinse route as needed for dry mouth., Disp: , Rfl:  .  aspirin EC 81 MG tablet, Take 81 mg by mouth daily. , Disp: ,  Rfl:  .  atorvastatin (LIPITOR) 40 MG tablet, Take 1 tablet (40 mg total) by mouth daily at 6 PM., Disp: 30 tablet, Rfl: 0 .  calcium-vitamin D (OSCAL WITH D) 500-200 MG-UNIT tablet, Take 1 tablet by mouth 2 (two) times daily., Disp: , Rfl:  .  carvedilol (COREG) 3.125 MG tablet, Take 1 tablet (3.125 mg total) by mouth 2 (two) times daily., Disp: 60 tablet, Rfl: 2 .  Cholecalciferol (VITAMIN D-1000 MAX ST) 1000 UNITS tablet, Take 1,000 Units by mouth daily. , Disp: , Rfl:  .  clopidogrel (PLAVIX) 75 MG tablet, Take 1 tablet (75 mg total) by mouth daily with breakfast., Disp: 30 tablet, Rfl: 0 .  Coenzyme Q10 100 MG capsule, Take 100 mg by mouth daily as needed., Disp: , Rfl:  .  fluticasone (FLONASE) 50 MCG/ACT nasal spray, Place 1 spray into both nostrils daily. , Disp: , Rfl:  .  gabapentin (NEURONTIN) 400 MG capsule, Take 1 capsule (400 mg total) by mouth 3 (three) times daily., Disp: 60 capsule, Rfl: 3 .  levothyroxine (SYNTHROID, LEVOTHROID) 88 MCG tablet, Take 1 tablet (88 mcg total) by mouth daily before breakfast., Disp: 90 tablet, Rfl: 1 .  Melatonin 3 MG TABS, Take 3 mg by mouth at bedtime. , Disp: , Rfl:  .  Multiple Vitamin (MULTI-VITAMINS) TABS, Take 1 tablet by mouth daily. , Disp: , Rfl:  .  nystatin cream (MYCOSTATIN), Apply topically 2 (two)  times daily., Disp: 30 g, Rfl: 0 .  ondansetron (ZOFRAN) 4 MG tablet, Take 4 mg by mouth every 8 (eight) hours as needed for nausea or vomiting., Disp: , Rfl:  .  oxybutynin (DITROPAN) 5 MG tablet, Take 1 tablet (5 mg total) by mouth 2 (two) times daily., Disp: 180 tablet, Rfl: 3 .  OXYGEN, Inhale 2 L into the lungs daily., Disp: , Rfl:  .  polyethylene glycol (MIRALAX / GLYCOLAX) packet, Take 17 g by mouth daily as needed for mild constipation or moderate constipation., Disp: , Rfl:  .  ranitidine (ZANTAC) 75 MG tablet, Take 75 mg by mouth daily., Disp: , Rfl:  .  senna (SENOKOT) 8.6 MG TABS tablet, Take 1 tablet by mouth daily., Disp: , Rfl:   .  sodium chloride (OCEAN) 0.65 % SOLN nasal spray, Place 1 spray into both nostrils as needed for congestion., Disp: , Rfl:     Review of Systems: Gen:  Denies  fever, sweats, chills HEENT: Denies blurred vision, double vision, ear pain, eye pain, hearing loss, nose bleeds, sore throat Cvc:  No dizziness, chest pain or heaviness Resp:   Admits [id number] cough, dyspnea on exertion (chronic). Gi: Denies swallowing difficulty, stomach pain, nausea or vomiting, diarrhea, constipation, bowel incontinence Gu:  Denies bladder incontinence, burning urine Ext:   No Joint pain, stiffness or swelling Skin: No skin rash, easy bruising or bleeding or hives Endoc:  No polyuria, polydipsia , polyphagia or weight change Other:  All other systems negative  Allergies:  Thimerosal  Physical Examination:  VS: BP 112/60 (BP Location: Right Arm, Cuff Size: Normal)   Pulse 83   Wt 130 lb (59 kg)   SpO2 100%   BMI 26.26 kg/m   General Appearance: No distress  HEENT: PERRLA, no ptosis, no other lesions noticed Pulmonary:normal breath sounds., diaphragmatic excursion normal.No wheezing, No rales   Cardiovascular:  Normal S1,S2.  No m/r/g.     Abdomen:Exam: Benign, Soft, non-tender, No masses  Skin:   warm, no rashes, no ecchymosis  Extremities: normal, no cyanosis, clubbing, warm with normal capillary refill.    Assessment and Plan:82 year old female presenting today for followup visit after NSTEMI s/p stent placement, following up for dyspnea.  SOB (shortness of breath) Multifactorial - recent NSTEMI s/p stent placement, deconditioning, left heart disease  The patient noted to have an O2 requirement after her cardiac event, she is completed participating with physical therapy and cardiac rehabilitation and there are concerns that she did have O2 desaturations early in her rehabilitation program, and is now improving slowly. Today's office visit, she was kept without oxygen for the entire visit, and  noted to have no O2 requirement at rest. Patient was unable to complete a full 6 minute walk test due to leg pain and cramping. However, she did walk 132 m/443 feet, lowest saturation 96%, highest heart rate 86.  At this time, given her level of deconditioning, I do not believe that she will require any further use of supplemental oxygen, in addition she is not wearing it and is not feeling worse clinically.  Plan: -Stop supplemental oxygen, and continue with rehabilitation as tolerated -Patient will have baseline dyspnea especially with exertion given her deconditioning, vascular disease and heart disease in combination with advanced age    Updated Medication List Outpatient Encounter Prescriptions as of 12/17/2015  Medication Sig  . antiseptic oral rinse (BIOTENE) LIQD 15 mLs by Mouth Rinse route as needed for dry mouth.  Marland Kitchen aspirin EC 81 MG  tablet Take 81 mg by mouth daily.   Marland Kitchen atorvastatin (LIPITOR) 40 MG tablet Take 1 tablet (40 mg total) by mouth daily at 6 PM.  . calcium-vitamin D (OSCAL WITH D) 500-200 MG-UNIT tablet Take 1 tablet by mouth 2 (two) times daily.  . carvedilol (COREG) 3.125 MG tablet Take 1 tablet (3.125 mg total) by mouth 2 (two) times daily.  . Cholecalciferol (VITAMIN D-1000 MAX ST) 1000 UNITS tablet Take 1,000 Units by mouth daily.   . clopidogrel (PLAVIX) 75 MG tablet Take 1 tablet (75 mg total) by mouth daily with breakfast.  . Coenzyme Q10 100 MG capsule Take 100 mg by mouth daily as needed.  . fluticasone (FLONASE) 50 MCG/ACT nasal spray Place 1 spray into both nostrils daily.   Marland Kitchen gabapentin (NEURONTIN) 400 MG capsule Take 1 capsule (400 mg total) by mouth 3 (three) times daily.  Marland Kitchen levothyroxine (SYNTHROID, LEVOTHROID) 88 MCG tablet Take 1 tablet (88 mcg total) by mouth daily before breakfast.  . Melatonin 3 MG TABS Take 3 mg by mouth at bedtime.   . Multiple Vitamin (MULTI-VITAMINS) TABS Take 1 tablet by mouth daily.   Marland Kitchen nystatin cream (MYCOSTATIN) Apply  topically 2 (two) times daily.  . ondansetron (ZOFRAN) 4 MG tablet Take 4 mg by mouth every 8 (eight) hours as needed for nausea or vomiting.  Marland Kitchen oxybutynin (DITROPAN) 5 MG tablet Take 1 tablet (5 mg total) by mouth 2 (two) times daily.  . OXYGEN Inhale 2 L into the lungs daily.  . polyethylene glycol (MIRALAX / GLYCOLAX) packet Take 17 g by mouth daily as needed for mild constipation or moderate constipation.  . ranitidine (ZANTAC) 75 MG tablet Take 75 mg by mouth daily.  Marland Kitchen senna (SENOKOT) 8.6 MG TABS tablet Take 1 tablet by mouth daily.  . sodium chloride (OCEAN) 0.65 % SOLN nasal spray Place 1 spray into both nostrils as needed for congestion.   No facility-administered encounter medications on file as of 12/17/2015.     Orders for this visit: No orders of the defined types were placed in this encounter.   Thank  you for the visitation and for allowing  [facility] to assist in the care of your patient. Our recommendations are noted above.  Please contact us if we can be of further service.  Vilinda Boehringer, MD [facility] Office Number: [phone]

## 2015-12-17 NOTE — Assessment & Plan Note (Addendum)
Multifactorial - recent NSTEMI s/p stent placement, deconditioning, left heart disease  The patient noted to have an O2 requirement after her cardiac event, she is completed participating with physical therapy and cardiac rehabilitation and there are concerns that she did have O2 desaturations early in her rehabilitation program, and is now improving slowly. Today's office visit, she was kept without oxygen for the entire visit, and noted to have no O2 requirement at rest. Patient was unable to complete a full 6 minute walk test due to leg pain and cramping. However, she did walk 132 m/443 feet, lowest saturation 96%, highest heart rate 86.  At this time, given her level of deconditioning, I do not believe that she will require any further use of supplemental oxygen, in addition she is not wearing it and is not feeling worse clinically.  Plan: -Stop supplemental oxygen, and continue with rehabilitation as tolerated -Patient will have baseline dyspnea especially with exertion given her deconditioning, vascular disease and heart disease in combination with advanced age

## 2015-12-18 DIAGNOSIS — I110 Hypertensive heart disease with heart failure: Secondary | ICD-10-CM | POA: Diagnosis not present

## 2015-12-18 DIAGNOSIS — I050 Rheumatic mitral stenosis: Secondary | ICD-10-CM | POA: Diagnosis not present

## 2015-12-18 DIAGNOSIS — I2510 Atherosclerotic heart disease of native coronary artery without angina pectoris: Secondary | ICD-10-CM | POA: Diagnosis not present

## 2015-12-18 DIAGNOSIS — I509 Heart failure, unspecified: Secondary | ICD-10-CM | POA: Diagnosis not present

## 2015-12-18 DIAGNOSIS — G629 Polyneuropathy, unspecified: Secondary | ICD-10-CM | POA: Diagnosis not present

## 2015-12-18 DIAGNOSIS — I350 Nonrheumatic aortic (valve) stenosis: Secondary | ICD-10-CM | POA: Diagnosis not present

## 2015-12-21 ENCOUNTER — Encounter: Payer: Self-pay | Admitting: Internal Medicine

## 2015-12-21 DIAGNOSIS — R7989 Other specified abnormal findings of blood chemistry: Secondary | ICD-10-CM | POA: Insufficient documentation

## 2015-12-21 NOTE — Assessment & Plan Note (Signed)
Increased from prior checks when compared to a couple of months ago.  Will have her hold lasix.  Follow renal function.  Swelling improved.  Follow.

## 2015-12-21 NOTE — Assessment & Plan Note (Signed)
Controlled on zantac.   

## 2015-12-21 NOTE — Assessment & Plan Note (Signed)
Has been taking pravastatin.  Was discharged on lipitor.  Have her stop pravastatin.  Take lipitor.  Follow cholesterol levels.

## 2015-12-21 NOTE — Assessment & Plan Note (Signed)
Followed by Dr Dew.   

## 2015-12-21 NOTE — Assessment & Plan Note (Signed)
Blood pressure doing well.  Wants to stop taking lasix on a regular basis.  No swelling.  Blood pressure stable.  Breathing improve.  Will have her hold lasix.  Follow.

## 2015-12-21 NOTE — Assessment & Plan Note (Signed)
Recently admitted with NSTEMI.  Followed by cardiology.  Continue plavix and aspirin.

## 2015-12-21 NOTE — Assessment & Plan Note (Signed)
On thyroid replacement.  Follow tsh.  

## 2015-12-21 NOTE — Assessment & Plan Note (Signed)
Improved.  Doing better.  Follow.

## 2015-12-21 NOTE — Assessment & Plan Note (Signed)
Breathing better.  Not requiring O2 now.  Continue f/u with pulmonary and cardiology.

## 2015-12-21 NOTE — Assessment & Plan Note (Signed)
Followed by [facility].   

## 2015-12-22 ENCOUNTER — Other Ambulatory Visit: Payer: Self-pay | Admitting: Internal Medicine

## 2015-12-22 ENCOUNTER — Ambulatory Visit (INDEPENDENT_AMBULATORY_CARE_PROVIDER_SITE_OTHER): Payer: Medicare Other

## 2015-12-22 VITALS — BP 118/64 | HR 76 | Temp 98.0°F | Resp 14 | Ht 59.0 in | Wt 131.8 lb

## 2015-12-22 DIAGNOSIS — I350 Nonrheumatic aortic (valve) stenosis: Secondary | ICD-10-CM | POA: Diagnosis not present

## 2015-12-22 DIAGNOSIS — I509 Heart failure, unspecified: Secondary | ICD-10-CM | POA: Diagnosis not present

## 2015-12-22 DIAGNOSIS — Z1231 Encounter for screening mammogram for malignant neoplasm of breast: Secondary | ICD-10-CM

## 2015-12-22 DIAGNOSIS — I2510 Atherosclerotic heart disease of native coronary artery without angina pectoris: Secondary | ICD-10-CM | POA: Diagnosis not present

## 2015-12-22 DIAGNOSIS — Z23 Encounter for immunization: Secondary | ICD-10-CM

## 2015-12-22 DIAGNOSIS — I050 Rheumatic mitral stenosis: Secondary | ICD-10-CM | POA: Diagnosis not present

## 2015-12-22 DIAGNOSIS — Z0000 Encounter for general adult medical examination without abnormal findings: Secondary | ICD-10-CM

## 2015-12-22 DIAGNOSIS — I110 Hypertensive heart disease with heart failure: Secondary | ICD-10-CM | POA: Diagnosis not present

## 2015-12-22 DIAGNOSIS — M810 Age-related osteoporosis without current pathological fracture: Secondary | ICD-10-CM

## 2015-12-22 DIAGNOSIS — Z1239 Encounter for other screening for malignant neoplasm of breast: Secondary | ICD-10-CM

## 2015-12-22 DIAGNOSIS — G629 Polyneuropathy, unspecified: Secondary | ICD-10-CM | POA: Diagnosis not present

## 2015-12-22 NOTE — Patient Instructions (Addendum)
Julia Patterson , Thank you for taking time to come for your Medicare Wellness Visit. I appreciate your ongoing commitment to your health goals. Please review the following plan we discussed and let me know if I can assist you in the future.   These are the goals we discussed: Goals    . Increase physical activity          Walk for exercise.  20 minutes, 4 days a week.       This is a list of the screening recommended for you and due dates:  Health Maintenance  Topic Date Due  . Tetanus Vaccine  12/04/1950  . Shingles Vaccine  12/04/1991  . DEXA scan (bone density measurement)  12/03/1996  . Mammogram  02/27/2015  . Flu Shot  Completed  . Pneumonia vaccines  Completed    Bone Densitometry Bone densitometry is an imaging test that uses a special X-ray to measure the amount of calcium and other minerals in your bones (bone density). This test is also known as a bone mineral density test or dual-energy X-ray absorptiometry (DXA). The test can measure bone density at your hip and your spine. It is similar to having a regular X-ray. You may have this test to:  Diagnose a condition that causes weak or thin bones (osteoporosis).  Predict your risk of a broken bone (fracture).  Determine how well osteoporosis treatment is working. LET [facility] CARE PROVIDER KNOW ABOUT:  Any allergies you have.  All medicines you are taking, including vitamins, herbs, eye drops, creams, and over-the-counter medicines.  Previous problems you or members of your family have had with the use of anesthetics.  Any blood disorders you have.  Previous surgeries you have had.  Medical conditions you have.  Possibility of pregnancy.  Any other medical test you had within the previous 14 days that used contrast material. RISKS AND COMPLICATIONS Generally, this is a safe procedure. However, problems can occur and may include the following:  This test exposes you to a very small amount of  radiation.  The risks of radiation exposure may be greater to unborn children. BEFORE THE PROCEDURE  Do not take any calcium supplements for 24 hours before having the test. You can otherwise eat and drink what you usually do.  Take off all metal jewelry, eyeglasses, dental appliances, and any other metal objects. PROCEDURE  You may lie on an exam table. There will be an X-ray generator below you and an imaging device above you.  Other devices, such as boxes or braces, may be used to position your body properly for the scan.  You will need to lie still while the machine slowly scans your body.  The images will show up on a computer monitor. AFTER THE PROCEDURE You may need more testing at a later time.   This information is not intended to replace advice given to you by your health care provider. Make sure you discuss any questions you have with your health care provider.   Document Released: 02/23/2004 Document Revised: 02/21/2014 Document Reviewed: 07/11/2013 Elsevier Interactive Patient Education 2016 [place] A mammogram is an X-ray of the breasts that is done to check for abnormal changes. This procedure can screen for and detect any changes that may suggest breast cancer. A mammogram can also identify other changes and variations in the breast, such as:  Inflammation of the breast tissue (mastitis).  An infected area that contains a collection of pus (abscess).  A fluid-filled  sac (cyst).  Fibrocystic changes. This is when breast tissue becomes denser, which can make the tissue feel rope-like or uneven under the skin.  Tumors that are not cancerous (benign). LET [facility] CARE PROVIDER KNOW ABOUT:  Any allergies you have.  If you have breast implants.  If you have had previous breast disease, biopsy, or surgery.  If you are breastfeeding.  Any possibility that you could be pregnant, if this applies.  If you are younger than age 9.  If you  have a family history of breast cancer. RISKS AND COMPLICATIONS Generally, this is a safe procedure. However, problems may occur, including:  Exposure to radiation. Radiation levels are very low with this test.  The results being misinterpreted.  The need for further tests.  The inability of the mammogram to detect certain cancers. BEFORE THE PROCEDURE  Schedule your test about 1-2 weeks after your menstrual period. This is usually when your breasts are the least tender.  If you have had a mammogram done at a different facility in the past, get the mammogram X-rays or have them sent to your current exam facility in order to compare them.  Wash your breasts and under your arms the day of the test.  Do not wear deodorants, perfumes, lotions, or powders anywhere on your body on the day of the test.  Remove any jewelry from your neck.  Wear clothes that you can change into and out of easily. PROCEDURE  You will undress from the waist up and put on a gown.  You will stand in front of the X-ray machine.  Each breast will be placed between two plastic or glass plates. The plates will compress your breast for a few seconds. Try to stay as relaxed as possible during the procedure. This does not cause any harm to your breasts and any discomfort you feel will be very brief.  X-rays will be taken from different angles of each breast. The procedure may vary among health care providers and hospitals. AFTER THE PROCEDURE  The mammogram will be examined by a specialist (radiologist).  You may need to repeat certain parts of the test, depending on the quality of the images. This is commonly done if the radiologist needs a better view of the breast tissue.  Ask when your test results will be ready. Make sure you get your test results.  You may resume your normal activities.   This information is not intended to replace advice given to you by your health care provider. Make sure you discuss  any questions you have with your health care provider.   Document Released: 01/29/2000 Document Revised: 10/22/2014 Document Reviewed: 04/11/2014 Elsevier Interactive Patient Education [employer].

## 2015-12-22 NOTE — Progress Notes (Addendum)
Subjective:   Julia Patterson is a 82 y.o. female who presents for an Initial Medicare Annual Wellness Visit.  Review of Systems    No ROS.  Medicare Wellness Visit.  Cardiac Risk Factors include: advanced age (>53men, >70 women);hypertension     Objective:    Today's Vitals   12/22/15 1657  BP: 118/64  Pulse: 76  Resp: 14  Temp: 98 F (36.7 C)  TempSrc: Oral  SpO2: 96%  Weight: 131 lb 12.8 oz (59.8 kg)  Height: 4\' 11"  (1.499 m)   Body mass index is 26.62 kg/m.   Current Medications (verified) Outpatient Encounter Prescriptions as of 12/22/2015  Medication Sig  . antiseptic oral rinse (BIOTENE) LIQD 15 mLs by Mouth Rinse route as needed for dry mouth.  Marland Kitchen aspirin EC 81 MG tablet Take 81 mg by mouth daily.   Marland Kitchen atorvastatin (LIPITOR) 40 MG tablet Take 1 tablet (40 mg total) by mouth daily at 6 PM.  . calcium-vitamin D (OSCAL WITH D) 500-200 MG-UNIT tablet Take 1 tablet by mouth 2 (two) times daily.  . carvedilol (COREG) 3.125 MG tablet Take 1 tablet (3.125 mg total) by mouth 2 (two) times daily.  . Cholecalciferol (VITAMIN D-1000 MAX ST) 1000 UNITS tablet Take 1,000 Units by mouth daily.   . clopidogrel (PLAVIX) 75 MG tablet Take 1 tablet (75 mg total) by mouth daily with breakfast.  . Coenzyme Q10 100 MG capsule Take 100 mg by mouth daily as needed.  . fluticasone (FLONASE) 50 MCG/ACT nasal spray Place 1 spray into both nostrils daily.   Marland Kitchen gabapentin (NEURONTIN) 400 MG capsule Take 1 capsule (400 mg total) by mouth 3 (three) times daily.  Marland Kitchen levothyroxine (SYNTHROID, LEVOTHROID) 88 MCG tablet Take 1 tablet (88 mcg total) by mouth daily before breakfast.  . Melatonin 3 MG TABS Take 3 mg by mouth at bedtime.   . Multiple Vitamin (MULTI-VITAMINS) TABS Take 1 tablet by mouth daily.   Marland Kitchen nystatin cream (MYCOSTATIN) Apply topically 2 (two) times daily.  . ondansetron (ZOFRAN) 4 MG tablet Take 4 mg by mouth every 8 (eight) hours as needed for nausea or vomiting.  Marland Kitchen  oxybutynin (DITROPAN) 5 MG tablet Take 1 tablet (5 mg total) by mouth 2 (two) times daily.  . polyethylene glycol (MIRALAX / GLYCOLAX) packet Take 17 g by mouth daily as needed for mild constipation or moderate constipation.  . ranitidine (ZANTAC) 75 MG tablet Take 75 mg by mouth daily.  Marland Kitchen senna (SENOKOT) 8.6 MG TABS tablet Take 1 tablet by mouth daily.  . sodium chloride (OCEAN) 0.65 % SOLN nasal spray Place 1 spray into both nostrils as needed for congestion.  . [DISCONTINUED] OXYGEN Inhale 2 L into the lungs daily.   No facility-administered encounter medications on file as of 12/22/2015.     Allergies (verified) Thimerosal   History: Past Medical History:  Diagnosis Date  . CAD in native artery    a. cath 09/24/15: p-mLAD 20%, pRCA 30%, mRCA 80% s/p PCI/DES 0%, nl EF by echo, LVEDP mod elevated  . Carotid artery occlusion   . Degenerative arthritis    lumbar spine.  spondylolisthesis, hip and leg pain, scoliosis  . Dyspnea on exertion    a. low-risk NST in 02/2014 b. echo 09/2015: EF 55% to 60%, no WMA, Grade 1 DD, mild AS, mild to moderate MS, mild MR, PA peak pressure of 45 mm Hg (S)  . GERD (gastroesophageal reflux disease)   . History of hiatal hernia   .  Hypercholesterolemia   . Hypertension   . Hypothyroidism   . Nausea   . NSTEMI (non-ST elevated myocardial infarction) ([place]) 09/23/2015  . Osteoporosis    s/p fosamax, boniva, reclast, forteo.  bilateral spontaneous femur fractures  . Recurrent cystitis   . Shortness of breath dyspnea   . Urinary incontinence   . Varicose veins    chronic venous insufficiency   Past Surgical History:  Procedure Laterality Date  . ABDOMINAL HYSTERECTOMY    . BREAST REDUCTION SURGERY  1987  . CARDIAC CATHETERIZATION N/A 09/24/2015   Procedure: Left Heart Cath and Coronary Angiography;  Surgeon: Wellington Hampshire, MD;  Location: [facility];  Service: Cardiovascular;  Laterality: N/A;  . CARDIAC CATHETERIZATION N/A 09/24/2015    Procedure: Coronary Stent Intervention;  Surgeon: Wellington Hampshire, MD;  Location: [facility];  Service: Cardiovascular;  Laterality: N/A;  . CARPAL TUNNEL RELEASE Left 09/21/2015   Procedure: CARPAL TUNNEL RELEASE;  Surgeon: Earnestine Leys, MD;  Location: [facility];  Service: Orthopedics;  Laterality: Left;  . hysterectomy and bladder repair  1998  . L5-L6 fusion  2/07  . TONSILLECTOMY AND ADENOIDECTOMY  1950  . trigger finger repair     right fourth  . uterus suspension and sterilization  1963  . varicose vein ligation  1972  . varicose vein sclerotherapy     Dr Hulda Humphrey   Family History  Problem Relation Age of Onset  . Rheumatic fever Mother     died age 54  . Heart attack Father   . Breast cancer Daughter   . Heart disease      father's family  . Diabetes      father's family   Social History   Occupational History  . Not on file.   Social History Main Topics  . Smoking status: Never Smoker  . Smokeless tobacco: Never Used  . Alcohol use No     Comment: rarely  . Drug use: No  . Sexual activity: No    Tobacco Counseling Counseling given: Not Answered   Activities of Daily Living In your present state of health, do you have any difficulty performing the following activities: 12/22/2015 09/23/2015  Hearing? [place]? N -  Difficulty concentrating or making decisions? Y -  Walking or climbing stairs? Y -  Dressing or bathing? N -  Doing errands, shopping? N Y  Conservation officer, nature and eating ? N -  Using the Toilet? N -  In the past six months, have you accidently leaked urine? Y -  Do you have problems with loss of bowel control? N -  Managing your Medications? N -  Managing your Finances? N -  Housekeeping or managing your Housekeeping? Y -  Some recent data might be hidden    Immunizations and Health Maintenance Immunization History  Administered Date(s) Administered  . Influenza Split 11/12/2012, 12/06/2013  . Influenza, High Dose Seasonal PF  11/25/2015  . Influenza,inj,Quad PF,36+ Mos 10/30/2014  . PPD Test 11/20/2012  . Pneumococcal Conjugate-13 12/06/2013  . Pneumococcal Polysaccharide-23 12/22/2015   Health Maintenance Due  Topic Date Due  . TETANUS/TDAP  12/04/1950  . ZOSTAVAX  12/04/1991  . DEXA SCAN  12/03/1996  . MAMMOGRAM  02/27/2015    Patient Care Team: Einar Pheasant, MD as PCP - General (Internal Medicine)  Indicate any recent Medical Services you may have received from other than [facility] providers in the past year (date may be approximate).     Assessment:  This is a routine wellness examination for [place]. The goal of the wellness visit is to assist the patient how to close the gaps in care and create a preventative care plan for the patient.   Taking calcium VIT D as appropriate/Osteoporosis reviewed.  Medications reviewed; taking without issues or barriers.  Safety issues reviewed; lives alone.  Life alert system and smoke detectors in the home. No firearms in the home. Wears seatbelts when driving or riding with others. No violence in the home.  No identified risk were noted; The patient was oriented x 3; appropriate in dress and manner and no objective failures at ADL's or IADL's.   Body mass index; discussed the importance of a healthy diet, water intake and exercise. Educational material provided.  Pneumovax 23 vaccine administered L deltoid, tolerated well.  Patient Concerns: None at this time. Follow up with PCP as needed.  Hearing/Vision screen Hearing Screening Comments: Wears hearing aids, bilateral Vision Screening Comments: Followed by [facility] Bilateral cataracts extracted Annual visits   Dietary issues and exercise activities discussed: Current Exercise Habits: The patient does not participate in regular exercise at present  Goals    . Increase physical activity          Walk for exercise.  20 minutes, 4 days a week.      Depression Screen PHQ 2/9 Scores  12/16/2015 11/25/2015 10/30/2014 06/05/2013 04/25/2012 03/02/2012  PHQ - 2 Score 0 0 0 0 0 0    Fall Risk Fall Risk  12/16/2015 11/25/2015 10/30/2014 06/05/2013  Falls in the past year? No No No Yes  Number falls in past yr: - - - 1  Injury with Fall? - - - No  Risk for fall due to : - - - Impaired balance/gait    Cognitive Function: MMSE - Mini Mental State Exam 12/22/2015  Orientation to time 5  Orientation to Place 5  Registration 3  Attention/ Calculation 5  Recall 3  Language- name 2 objects 2  Language- repeat 1  Language- follow 3 step command 3  Language- read & follow direction 1  Write a sentence 1  Copy design 1  Total score 30     6CIT Screen 12/22/2015  What Year? 0 points  What month? 0 points  What time? 0 points  Count back from 20 0 points  Months in reverse 0 points  Repeat phrase 0 points  Total Score 0    Screening Tests Health Maintenance  Topic Date Due  . TETANUS/TDAP  12/04/1950  . ZOSTAVAX  12/04/1991  . DEXA SCAN  12/03/1996  . MAMMOGRAM  02/27/2015  . INFLUENZA VACCINE  Completed  . PNA vac Low Risk Adult  Completed      Plan:    End of life planning; Advance aging; Advanced directives discussed. Copy of current HCPOA/Living Will on file.  Medicare Attestation I have personally reviewed: The patient's medical and social history Their use of alcohol, tobacco or illicit drugs Their current medications and supplements The patient's functional ability including ADLs,fall risks, home safety risks, cognitive, and hearing and visual impairment Diet and physical activities Evidence for depression   The patient's weight, height, BMI, and visual acuity have been recorded in the chart.  I have made referrals and provided education to the patient based on review of the above and I have provided the patient with a written personalized care plan for preventive services.    During the course of the visit, [place] was educated and counseled about  the following appropriate screening and preventive services:   Vaccines to include Pneumoccal, Influenza, Hepatitis B, Td, Zostavax, HCV  Electrocardiogram  Cardiovascular disease screening  Colorectal cancer screening  Bone density screening  Diabetes screening  Glaucoma screening  Mammography/PAP  Nutrition counseling  Smoking cessation counseling  Patient Instructions (the written plan) were given to the patient.    Varney Biles, LPN   [id number]    Reviewed above information.  Agree with plan.  Dr Nicki Reaper

## 2015-12-22 NOTE — Progress Notes (Signed)
Order placed for bone density  

## 2015-12-24 DIAGNOSIS — I509 Heart failure, unspecified: Secondary | ICD-10-CM | POA: Diagnosis not present

## 2015-12-24 DIAGNOSIS — I110 Hypertensive heart disease with heart failure: Secondary | ICD-10-CM | POA: Diagnosis not present

## 2015-12-24 DIAGNOSIS — I350 Nonrheumatic aortic (valve) stenosis: Secondary | ICD-10-CM | POA: Diagnosis not present

## 2015-12-24 DIAGNOSIS — G629 Polyneuropathy, unspecified: Secondary | ICD-10-CM | POA: Diagnosis not present

## 2015-12-24 DIAGNOSIS — I050 Rheumatic mitral stenosis: Secondary | ICD-10-CM | POA: Diagnosis not present

## 2015-12-24 DIAGNOSIS — I2510 Atherosclerotic heart disease of native coronary artery without angina pectoris: Secondary | ICD-10-CM | POA: Diagnosis not present

## 2015-12-28 ENCOUNTER — Telehealth: Payer: Self-pay | Admitting: *Deleted

## 2015-12-28 ENCOUNTER — Telehealth: Payer: Self-pay | Admitting: Internal Medicine

## 2015-12-28 ENCOUNTER — Other Ambulatory Visit: Payer: Self-pay

## 2015-12-28 MED ORDER — CLOPIDOGREL BISULFATE 75 MG PO TABS: 75.0000 mg | ORAL_TABLET | Freq: Every day | ORAL | 1 refills | Status: DC

## 2015-12-28 NOTE — Telephone Encounter (Signed)
Left message to return call 

## 2015-12-28 NOTE — Telephone Encounter (Signed)
Ok to send in rx for plavix to mail order (#90 with one refill).    Need to clarify if she needs any locally to cover her until her mail order comes in.  Do not want her to be out of her medication.  If she needs, ok to send in plavix #30 with no refills.

## 2015-12-28 NOTE — Telephone Encounter (Signed)
Last filled 09/25/15 30 0rf

## 2015-12-28 NOTE — Telephone Encounter (Signed)
Patient requested a medication  refill for clopidogrel  Pharmacy [facility] mail

## 2015-12-28 NOTE — Telephone Encounter (Signed)
Pt called requesting a refill on clopidogrel (PLAVIX) 75 MG tablet. Please advise,thank you!  [facility] - [place], [place]  Call pt @ [phone]

## 2015-12-29 ENCOUNTER — Ambulatory Visit (INDEPENDENT_AMBULATORY_CARE_PROVIDER_SITE_OTHER): Payer: Medicare Other | Admitting: Vascular Surgery

## 2015-12-29 ENCOUNTER — Encounter (INDEPENDENT_AMBULATORY_CARE_PROVIDER_SITE_OTHER): Payer: Medicare Other

## 2015-12-29 DIAGNOSIS — I350 Nonrheumatic aortic (valve) stenosis: Secondary | ICD-10-CM | POA: Diagnosis not present

## 2015-12-29 DIAGNOSIS — I509 Heart failure, unspecified: Secondary | ICD-10-CM | POA: Diagnosis not present

## 2015-12-29 DIAGNOSIS — I2510 Atherosclerotic heart disease of native coronary artery without angina pectoris: Secondary | ICD-10-CM | POA: Diagnosis not present

## 2015-12-29 DIAGNOSIS — G629 Polyneuropathy, unspecified: Secondary | ICD-10-CM | POA: Diagnosis not present

## 2015-12-29 DIAGNOSIS — I110 Hypertensive heart disease with heart failure: Secondary | ICD-10-CM | POA: Diagnosis not present

## 2015-12-29 DIAGNOSIS — I050 Rheumatic mitral stenosis: Secondary | ICD-10-CM | POA: Diagnosis not present

## 2015-12-29 NOTE — Telephone Encounter (Signed)
Patient states she has plenty

## 2016-01-09 ENCOUNTER — Other Ambulatory Visit: Payer: Self-pay | Admitting: Internal Medicine

## 2016-01-28 ENCOUNTER — Encounter: Payer: Self-pay | Admitting: Internal Medicine

## 2016-01-28 ENCOUNTER — Encounter: Payer: Self-pay | Admitting: Cardiovascular Disease

## 2016-01-28 ENCOUNTER — Ambulatory Visit (INDEPENDENT_AMBULATORY_CARE_PROVIDER_SITE_OTHER): Payer: Medicare Other | Admitting: Cardiovascular Disease

## 2016-01-28 VITALS — BP 124/68 | HR 68 | Ht 59.0 in | Wt 131.5 lb

## 2016-01-28 DIAGNOSIS — I779 Disorder of arteries and arterioles, unspecified: Secondary | ICD-10-CM | POA: Diagnosis not present

## 2016-01-28 DIAGNOSIS — I739 Peripheral vascular disease, unspecified: Secondary | ICD-10-CM

## 2016-01-28 DIAGNOSIS — I10 Essential (primary) hypertension: Secondary | ICD-10-CM | POA: Diagnosis not present

## 2016-01-28 DIAGNOSIS — E7800 Pure hypercholesterolemia, unspecified: Secondary | ICD-10-CM

## 2016-01-28 DIAGNOSIS — I701 Atherosclerosis of renal artery: Secondary | ICD-10-CM

## 2016-01-28 DIAGNOSIS — I2510 Atherosclerotic heart disease of native coronary artery without angina pectoris: Secondary | ICD-10-CM

## 2016-01-28 MED ORDER — PANTOPRAZOLE SODIUM 40 MG PO TBEC: 40.0000 mg | DELAYED_RELEASE_TABLET | Freq: Every day | ORAL | 0 refills | Status: DC

## 2016-01-28 NOTE — Progress Notes (Signed)
Cardiology Office Note   Date:  01/28/2016   ID:  Julia Patterson, DOB November 30, 1929, MRN [id number]  PCP:  Julia Pheasant, MD  Cardiologist:   Julia Sacramento, MD   Chief Complaint  Patient presents with  . other    18mo f/u. Pt c/o mild indigestion and experiencing some blood when blowing her nose.  Reviewed meds with pt verbally.      History of Present Illness: Julia Patterson is a 82 y.o. female who presents for a follow-up visit for coronary artery disease. She has multiple chronic medical conditions that include hyperlipidemia, hypothyroidism, asymptomatic carotid stenosis, renal artery stenosis and left bundle branch block.  She had carpal tunnel surgery in August. She presented shortly after that with weakness and difficulty in walking. She was found to have mildly elevated troponin at 1.7. She underwent cardiac catheterization which showed 80% hazy stenosis in the midright coronary artery with mild disease affecting the LAD. She underwent successful angioplasty and drug-eluting stent placement to the right coronary artery. LVEDP was moderately elevated. Echocardiogram showed normal LV systolic function with grade 2 diastolic dysfunction, moderate mitral regurgitation and mild-to-moderate tricuspid regurgitation. She had issues with volume overload that improved with furosemide. Her overall condition gradually improved and she was discharged from rehabilitation to have irregular assisted living facility. She is doing well overall with no chest pain or shortness of breath. She reports worsening heartburn and indigestion mostly at night which responded to Zantac. She will resume driving.    Past Medical History:  Diagnosis Date  . CAD in native artery    a. cath 09/24/15: p-mLAD 20%, pRCA 30%, mRCA 80% s/p PCI/DES 0%, nl EF by echo, LVEDP mod elevated  . Carotid artery occlusion   . Degenerative arthritis    lumbar spine.  spondylolisthesis, hip and leg pain,  scoliosis  . Dyspnea on exertion    a. low-risk NST in 02/2014 b. echo 09/2015: EF 55% to 60%, no WMA, Grade 1 DD, mild AS, mild to moderate MS, mild MR, PA peak pressure of 45 mm Hg (S)  . GERD (gastroesophageal reflux disease)   . History of hiatal hernia   . Hypercholesterolemia   . Hypertension   . Hypothyroidism   . Nausea   . NSTEMI (non-ST elevated myocardial infarction) ([place]) 09/23/2015  . Osteoporosis    s/p fosamax, boniva, reclast, forteo.  bilateral spontaneous femur fractures  . Recurrent cystitis   . Shortness of breath dyspnea   . Urinary incontinence   . Varicose veins    chronic venous insufficiency    Past Surgical History:  Procedure Laterality Date  . ABDOMINAL HYSTERECTOMY    . BREAST REDUCTION SURGERY  1987  . CARDIAC CATHETERIZATION N/A 09/24/2015   Procedure: Left Heart Cath and Coronary Angiography;  Surgeon: Julia Hampshire, MD;  Location: [facility];  Service: Cardiovascular;  Laterality: N/A;  . CARDIAC CATHETERIZATION N/A 09/24/2015   Procedure: Coronary Stent Intervention;  Surgeon: Julia Hampshire, MD;  Location: [facility];  Service: Cardiovascular;  Laterality: N/A;  . CARPAL TUNNEL RELEASE Left 09/21/2015   Procedure: CARPAL TUNNEL RELEASE;  Surgeon: Julia Leys, MD;  Location: [facility];  Service: Orthopedics;  Laterality: Left;  . hysterectomy and bladder repair  1998  . L5-L6 fusion  2/07  . TONSILLECTOMY AND ADENOIDECTOMY  1950  . trigger finger repair     right fourth  . uterus suspension and sterilization  1963  . varicose vein ligation  1972  . varicose vein sclerotherapy     Dr Julia Patterson     Current Outpatient Prescriptions  Medication Sig Dispense Refill  . antiseptic oral rinse (BIOTENE) LIQD 15 mLs by Mouth Rinse route as needed for dry mouth.    Marland Kitchen aspirin EC 81 MG tablet Take 81 mg by mouth daily.     Marland Kitchen atorvastatin (LIPITOR) 40 MG tablet TAKE 1 TABLET EVERY DAY  AT  6 P.M. 90 tablet 0  . calcium-vitamin D (OSCAL  WITH D) 500-200 MG-UNIT tablet Take 1 tablet by mouth 2 (two) times daily.    . Cholecalciferol (VITAMIN D-1000 MAX ST) 1000 UNITS tablet Take 1,000 Units by mouth daily.     . clopidogrel (PLAVIX) 75 MG tablet Take 1 tablet (75 mg total) by mouth daily with breakfast. 90 tablet 1  . Coenzyme Q10 100 MG capsule Take 100 mg by mouth daily as needed.    . fluticasone (FLONASE) 50 MCG/ACT nasal spray Place 1 spray into both nostrils daily as needed.     . gabapentin (NEURONTIN) 400 MG capsule Take 1 capsule (400 mg total) by mouth 3 (three) times daily. 60 capsule 3  . levothyroxine (SYNTHROID, LEVOTHROID) 88 MCG tablet Take 1 tablet (88 mcg total) by mouth daily before breakfast. 90 tablet 1  . Melatonin 3 MG TABS Take 3 mg by mouth at bedtime.     . Multiple Vitamin (MULTI-VITAMINS) TABS Take 1 tablet by mouth daily.     Marland Kitchen nystatin cream (MYCOSTATIN) Apply topically 2 (two) times daily. (Patient taking differently: Apply topically 2 (two) times daily as needed. ) 30 g 0  . ondansetron (ZOFRAN) 4 MG tablet Take 4 mg by mouth every 8 (eight) hours as needed for nausea or vomiting.    Marland Kitchen oxybutynin (DITROPAN) 5 MG tablet Take 1 tablet (5 mg total) by mouth 2 (two) times daily. 180 tablet 3  . polyethylene glycol (MIRALAX / GLYCOLAX) packet Take 17 g by mouth daily as needed for mild constipation or moderate constipation.    . ranitidine (ZANTAC) 75 MG tablet Take 75 mg by mouth daily.    Marland Kitchen senna (SENOKOT) 8.6 MG TABS tablet Take 1 tablet by mouth daily as needed.     . sodium chloride (OCEAN) 0.65 % SOLN nasal spray Place 1 spray into both nostrils as needed for congestion.    . carvedilol (COREG) 3.125 MG tablet Take 1 tablet (3.125 mg total) by mouth 2 (two) times daily. 60 tablet 2  . pantoprazole (PROTONIX) 40 MG tablet Take 1 tablet (40 mg total) by mouth daily. 30 tablet 0   No current facility-administered medications for this visit.     Allergies:   Thimerosal    Social History:  The  patient  reports that she has never smoked. She has never used smokeless tobacco. She reports that she does not drink alcohol or use drugs.   Family History:  The patient's family history includes Breast cancer in her daughter; Heart attack in her father; Rheumatic fever in her mother.    ROS:  Please see the history of present illness.   Otherwise, review of systems are positive for none.   All other systems are reviewed and negative.    PHYSICAL EXAM: VS:  BP 124/68 (BP Location: Right Arm, Patient Position: Sitting, Cuff Size: Normal)   Pulse 68   Ht 4\' 11"  (1.499 m)   Wt 131 lb 8 oz (59.6 kg)   BMI 26.56 kg/m  , BMI  Body mass index is 26.56 kg/m. GEN: Well nourished, well developed, in no acute distress  HEENT: normal  Neck: no JVD, , or masses. Left carotid bruit. Cardiac: RRR; no rubs, or gallops,no edema . 2 out of 6 mid peaking systolic murmur in the aortic area and left sternal border.  Respiratory:  clear to auscultation bilaterally, normal work of breathing GI: soft, nontender, nondistended, + BS MS: no deformity or atrophy  Skin: warm and dry, no rash Neuro:  Strength and sensation are intact Psych: euthymic mood, full affect   EKG:  EKG is ordered today. EKG showed normal sinus rhythm with left bundle branch block.    Recent Labs: 05/27/2015: TSH 2.08 09/28/2015: Magnesium 2.1 10/13/2015: ALT 13; Hemoglobin 14.2; Platelets 218 11/25/2015: BUN 25; Creatinine, Ser 1.37; Potassium 3.9; Sodium 144    Lipid Panel    Component Value Date/Time   CHOL 127 09/24/2015 0843   TRIG 111 09/24/2015 0843   HDL 32 (L) 09/24/2015 0843   CHOLHDL 4.0 09/24/2015 0843   VLDL 22 09/24/2015 0843   LDLCALC 73 09/24/2015 0843      Wt Readings from Last 3 Encounters:  01/28/16 131 lb 8 oz (59.6 kg)  12/22/15 131 lb 12.8 oz (59.8 kg)  12/17/15 130 lb (59 kg)        ASSESSMENT AND PLAN:  1.  Coronary artery disease involving native coronary arteries: Status post RCA PCI  with a drug-eluting stent placement in August 2017.No anginal symptoms. Continue dual antiplatelet therapy for one year until August 2018. Minimal duration would be 6 months.   She is having symptoms suggestive of GERD and I elected to prescribe Protonix 40 mg once daily for one month.  2. Chronic diastolic heart failure: She appears to be euvolemic and she is no longer on furosemide.   3. Asymptomatic carotid disease: Followed by [facility].   4. Essential hypertension: Blood pressure is well-controlled.   5. Hyperlipidemia: Currently on atorvastatin 40 mg once daily. Most recent LDL was 73 which is close to target.    Disposition:   FU with me in 3 months  Signed,  Julia Sacramento, MD  01/28/2016 4:09 PM    [place]

## 2016-01-28 NOTE — Patient Instructions (Signed)
Medication Instructions:  Your physician has recommended you make the following change in your medication:  START taking protonix 40mg  once daily for one month. Discontinue zantac while taking protonix. Resume zantac after you stop taking protonix   Labwork: none  Testing/Procedures: none  Follow-Up: Your physician recommends that you schedule a follow-up appointment in: 4 months with Dr. Fletcher Anon.    Any Other Special Instructions Will Be Listed Below (If Applicable).     If you need a refill on your cardiac medications before your next appointment, please call your pharmacy.

## 2016-02-04 ENCOUNTER — Ambulatory Visit: Payer: Medicare Other | Admitting: Cardiovascular Disease

## 2016-02-16 ENCOUNTER — Encounter (INDEPENDENT_AMBULATORY_CARE_PROVIDER_SITE_OTHER): Payer: Medicare Other

## 2016-02-16 ENCOUNTER — Ambulatory Visit (INDEPENDENT_AMBULATORY_CARE_PROVIDER_SITE_OTHER): Payer: Medicare Other | Admitting: Vascular Surgery

## 2016-02-22 ENCOUNTER — Ambulatory Visit (INDEPENDENT_AMBULATORY_CARE_PROVIDER_SITE_OTHER): Payer: Medicare Other

## 2016-02-22 ENCOUNTER — Encounter: Payer: Self-pay | Admitting: Internal Medicine

## 2016-02-22 ENCOUNTER — Ambulatory Visit (INDEPENDENT_AMBULATORY_CARE_PROVIDER_SITE_OTHER): Payer: Medicare Other | Admitting: Internal Medicine

## 2016-02-22 DIAGNOSIS — I2510 Atherosclerotic heart disease of native coronary artery without angina pectoris: Secondary | ICD-10-CM | POA: Diagnosis not present

## 2016-02-22 DIAGNOSIS — I10 Essential (primary) hypertension: Secondary | ICD-10-CM | POA: Diagnosis not present

## 2016-02-22 DIAGNOSIS — E039 Hypothyroidism, unspecified: Secondary | ICD-10-CM

## 2016-02-22 DIAGNOSIS — I739 Peripheral vascular disease, unspecified: Secondary | ICD-10-CM

## 2016-02-22 DIAGNOSIS — E7800 Pure hypercholesterolemia, unspecified: Secondary | ICD-10-CM

## 2016-02-22 DIAGNOSIS — M79632 Pain in left forearm: Secondary | ICD-10-CM | POA: Diagnosis not present

## 2016-02-22 DIAGNOSIS — I779 Disorder of arteries and arterioles, unspecified: Secondary | ICD-10-CM

## 2016-02-22 DIAGNOSIS — M79602 Pain in left arm: Secondary | ICD-10-CM | POA: Insufficient documentation

## 2016-02-22 DIAGNOSIS — K219 Gastro-esophageal reflux disease without esophagitis: Secondary | ICD-10-CM | POA: Diagnosis not present

## 2016-02-22 DIAGNOSIS — I701 Atherosclerosis of renal artery: Secondary | ICD-10-CM

## 2016-02-22 DIAGNOSIS — S59912A Unspecified injury of left forearm, initial encounter: Secondary | ICD-10-CM | POA: Diagnosis not present

## 2016-02-22 MED ORDER — CEPHALEXIN 500 MG PO CAPS: 500.0000 mg | ORAL_CAPSULE | Freq: Three times a day (TID) | ORAL | 0 refills | Status: DC

## 2016-02-22 NOTE — Progress Notes (Signed)
Pre visit review using our clinic review tool, if applicable. No additional management support is needed unless otherwise documented below in the visit note. 

## 2016-02-22 NOTE — Patient Instructions (Addendum)
Take a probiotic daily while you are on the antibiotics and for two weeks after completing the antibiotics.    Examples of probiotics - align, culturelle or florastor.

## 2016-02-22 NOTE — Progress Notes (Signed)
Patient ID: Julia Patterson, female   DOB: 11-24-1929, 83 y.o.   MRN: [id number]   Subjective:    Patient ID: Julia Patterson, female    DOB: May 12, 1929, 83 y.o.   MRN: [id number]  HPI  Patient here for a scheduled follow up.  Has known CAD with cath - 80% stenosis in the mid right coronary arthery with mild disease affecting the LAD.  S/p angioplasy and stent placement to the RCA.  Had issues with volume overload.  Improved with lasix.  Just saw Dr Fletcher Anon 01/28/16.  Remains on dual anti platelet therapy.  Was having problems with reflux.  Dr Fletcher Anon placed on her protonix.   She has been doing relatively well.  She did fall approximately 12 days ago.  Landed on her left arm.  Injured her elbow.  Increased swelling.  Abrasion.  Has been having dressed where she resides.  No head injury.  No other injury.  Reports increased pain to palpation over the elbow. No pain with flexion and extension of her arm.  Does report increased pain - forearm - notices at night.  Some pain in her wrist to palpation.  No pain with flexion and extension of wrist.  States otherwise doing relatively well.  Eating well.  No nausea or vomiting.  Breathing stable.     Past Medical History:  Diagnosis Date  . CAD in native artery    a. cath 09/24/15: p-mLAD 20%, pRCA 30%, mRCA 80% s/p PCI/DES 0%, nl EF by echo, LVEDP mod elevated  . Carotid artery occlusion   . Degenerative arthritis    lumbar spine.  spondylolisthesis, hip and leg pain, scoliosis  . Dyspnea on exertion    a. low-risk NST in 02/2014 b. echo 09/2015: EF 55% to 60%, no WMA, Grade 1 DD, mild AS, mild to moderate MS, mild MR, PA peak pressure of 45 mm Hg (S)  . GERD (gastroesophageal reflux disease)   . History of hiatal hernia   . Hypercholesterolemia   . Hypertension   . Hypothyroidism   . Nausea   . NSTEMI (non-ST elevated myocardial infarction) ([place]) 09/23/2015  . Osteoporosis    s/p fosamax, boniva, reclast, forteo.  bilateral spontaneous femur  fractures  . Recurrent cystitis   . Shortness of breath dyspnea   . Urinary incontinence   . Varicose veins    chronic venous insufficiency   Past Surgical History:  Procedure Laterality Date  . ABDOMINAL HYSTERECTOMY    . BREAST REDUCTION SURGERY  1987  . CARDIAC CATHETERIZATION N/A 09/24/2015   Procedure: Left Heart Cath and Coronary Angiography;  Surgeon: Wellington Hampshire, MD;  Location: [facility];  Service: Cardiovascular;  Laterality: N/A;  . CARDIAC CATHETERIZATION N/A 09/24/2015   Procedure: Coronary Stent Intervention;  Surgeon: Wellington Hampshire, MD;  Location: [facility];  Service: Cardiovascular;  Laterality: N/A;  . CARPAL TUNNEL RELEASE Left 09/21/2015   Procedure: CARPAL TUNNEL RELEASE;  Surgeon: Earnestine Leys, MD;  Location: [facility];  Service: Orthopedics;  Laterality: Left;  . hysterectomy and bladder repair  1998  . L5-L6 fusion  2/07  . TONSILLECTOMY AND ADENOIDECTOMY  1950  . trigger finger repair     right fourth  . uterus suspension and sterilization  1963  . varicose vein ligation  1972  . varicose vein sclerotherapy     Dr Hulda Humphrey   Family History  Problem Relation Age of Onset  . Rheumatic fever Mother     died  age 52  . Heart attack Father   . Breast cancer Daughter   . Heart disease      father's family  . Diabetes      father's family   Social History   Social History  . Marital status: Single    Spouse name: N/A  . Number of children: N/A  . Years of education: N/A   Social History Main Topics  . Smoking status: Never Smoker  . Smokeless tobacco: Never Used  . Alcohol use No     Comment: rarely  . Drug use: No  . Sexual activity: No   Other Topics Concern  . None   Social History Narrative  . None    Outpatient Encounter Prescriptions as of 02/22/2016  Medication Sig  . antiseptic oral rinse (BIOTENE) LIQD 15 mLs by Mouth Rinse route as needed for dry mouth.  Marland Kitchen aspirin EC 81 MG tablet Take 81 mg by mouth daily.     Marland Kitchen atorvastatin (LIPITOR) 40 MG tablet TAKE 1 TABLET EVERY DAY  AT  6 P.M.  . calcium-vitamin D (OSCAL WITH D) 500-200 MG-UNIT tablet Take 1 tablet by mouth 2 (two) times daily.  . Cholecalciferol (VITAMIN D-1000 MAX ST) 1000 UNITS tablet Take 1,000 Units by mouth daily.   . clopidogrel (PLAVIX) 75 MG tablet Take 1 tablet (75 mg total) by mouth daily with breakfast.  . Coenzyme Q10 100 MG capsule Take 100 mg by mouth daily as needed.  . fluticasone (FLONASE) 50 MCG/ACT nasal spray Place 1 spray into both nostrils daily as needed.   . gabapentin (NEURONTIN) 400 MG capsule Take 1 capsule (400 mg total) by mouth 3 (three) times daily.  Marland Kitchen levothyroxine (SYNTHROID, LEVOTHROID) 88 MCG tablet Take 1 tablet (88 mcg total) by mouth daily before breakfast.  . Melatonin 3 MG TABS Take 3 mg by mouth at bedtime.   . Multiple Vitamin (MULTI-VITAMINS) TABS Take 1 tablet by mouth daily.   Marland Kitchen nystatin cream (MYCOSTATIN) Apply topically 2 (two) times daily. (Patient taking differently: Apply topically 2 (two) times daily as needed. )  . ondansetron (ZOFRAN) 4 MG tablet Take 4 mg by mouth every 8 (eight) hours as needed for nausea or vomiting.  Marland Kitchen oxybutynin (DITROPAN) 5 MG tablet Take 1 tablet (5 mg total) by mouth 2 (two) times daily.  . pantoprazole (PROTONIX) 40 MG tablet Take 1 tablet (40 mg total) by mouth daily.  . polyethylene glycol (MIRALAX / GLYCOLAX) packet Take 17 g by mouth daily as needed for mild constipation or moderate constipation.  . ranitidine (ZANTAC) 75 MG tablet Take 75 mg by mouth daily.  Marland Kitchen senna (SENOKOT) 8.6 MG TABS tablet Take 1 tablet by mouth daily as needed.   . sodium chloride (OCEAN) 0.65 % SOLN nasal spray Place 1 spray into both nostrils as needed for congestion.  . carvedilol (COREG) 3.125 MG tablet Take 1 tablet (3.125 mg total) by mouth 2 (two) times daily.  . cephALEXin (KEFLEX) 500 MG capsule Take 1 capsule (500 mg total) by mouth 3 (three) times daily.   No  facility-administered encounter medications on file as of 02/22/2016.     Review of Systems  Constitutional: Negative for appetite change, fatigue and unexpected weight change.  HENT: Negative for congestion and sinus pressure.   Respiratory: Negative for cough, chest tightness and shortness of breath.   Cardiovascular: Negative for chest pain and palpitations.       No increased swelling.   Gastrointestinal: Negative for abdominal  pain, diarrhea, nausea and vomiting.  Genitourinary: Negative for difficulty urinating and dysuria.  Musculoskeletal:       Increased pain left forearm.  Increased soft tissue swelling left elbow.    Skin: Negative for rash.       Some increased erythema - left elbow.    Neurological: Negative for dizziness, light-headedness and headaches.  Psychiatric/Behavioral: Negative for agitation and dysphoric mood.       Objective:    Physical Exam  Constitutional: She appears well-developed and well-nourished. No distress.  HENT:  Nose: Nose normal.  Mouth/Throat: Oropharynx is clear and moist.  Neck: Neck supple. No thyromegaly present.  Cardiovascular: Normal rate and regular rhythm.   Pulmonary/Chest: Breath sounds normal. No respiratory distress. She has no wheezes.  Abdominal: Soft. Bowel sounds are normal. There is no tenderness.  Musculoskeletal: She exhibits no tenderness.  Increased pain - wrist.  No pain with flexion and extension of wrist or at elbow.  Increased soft tissue swelling and erythema - left elbow.  Some increased tenderness to palpation - left elbow.  Abrasion.    Lymphadenopathy:    She has no cervical adenopathy.  Skin: No rash noted.  Psychiatric: She has a normal mood and affect. Her behavior is normal.    BP 138/74   Pulse 74   Temp 98.3 F (36.8 C) (Oral)   Wt 136 lb 6.4 oz (61.9 kg)   SpO2 92%   BMI 27.55 kg/m  Wt Readings from Last 3 Encounters:  02/22/16 136 lb 6.4 oz (61.9 kg)  01/28/16 131 lb 8 oz (59.6 kg)    12/22/15 131 lb 12.8 oz (59.8 kg)     Lab Results  Component Value Date   WBC 9.1 10/13/2015   HGB 14.2 10/13/2015   HCT 41.1 10/13/2015   PLT 218 10/13/2015   GLUCOSE 94 11/25/2015   CHOL 127 09/24/2015   TRIG 111 09/24/2015   HDL 32 (L) 09/24/2015   LDLCALC 73 09/24/2015   ALT 13 (L) 10/13/2015   AST 18 10/13/2015   NA 144 11/25/2015   K 3.9 11/25/2015   CL 104 11/25/2015   CREATININE 1.37 (H) 11/25/2015   BUN 25 (H) 11/25/2015   CO2 31 11/25/2015   TSH 2.08 05/27/2015   INR 1.10 09/23/2015       Assessment & Plan:   Problem List Items Addressed This Visit    CAD in native artery    Followed by cardiology.  Continue risk factor modification.  Just saw Dr Fletcher Anon.       Carotid artery disease ([place])    Followed by Dr Lucky Cowboy.       Essential hypertension, benign    Blood pressure under good control.  Continue same medication regimen.  Follow pressures.  Follow metabolic panel.        Relevant Orders   Basic metabolic panel   GERD (gastroesophageal reflux disease)    On protonix now.  No problems reported.       Hypercholesterolemia    On lipitor.  Low cholesterol diet and exercise.  Follow lipid panel and liver function tests.   Lab Results  Component Value Date   CHOL 127 09/24/2015   HDL 32 (L) 09/24/2015   LDLCALC 73 09/24/2015   TRIG 111 09/24/2015   CHOLHDL 4.0 09/24/2015        Relevant Orders   Hepatic function panel   Lipid panel   Hypothyroidism    On thyroid replacement.  Follow tsh.  Relevant Orders   TSH   Left arm pain    Persistent pain s/p fall.  Will xray.  Has increased soft tissue swelling - left elbow.  Some erythema.  Will place on keflex.  Continue dressing changes daily.  Instructed to leave open some each day.  Given the persistent soft tissue swelling, etc, will have ortho evaluate.        Relevant Orders   DG Forearm Left (Completed)   Ambulatory referral to Orthopedic Surgery   Renal artery stenosis ([place])     Followed by [facility].           Einar Pheasant, MD

## 2016-02-23 ENCOUNTER — Encounter: Payer: Self-pay | Admitting: Internal Medicine

## 2016-02-23 NOTE — Assessment & Plan Note (Signed)
Persistent pain s/p fall.  Will xray.  Has increased soft tissue swelling - left elbow.  Some erythema.  Will place on keflex.  Continue dressing changes daily.  Instructed to leave open some each day.  Given the persistent soft tissue swelling, etc, will have ortho evaluate.

## 2016-02-23 NOTE — Assessment & Plan Note (Signed)
Blood pressure under good control.  Continue same medication regimen.  Follow pressures.  Follow metabolic panel.   

## 2016-02-23 NOTE — Assessment & Plan Note (Signed)
On thyroid replacement.  Follow tsh.  

## 2016-02-23 NOTE — Assessment & Plan Note (Signed)
Followed by cardiology.  Continue risk factor modification.  Just saw Dr Fletcher Anon.

## 2016-02-23 NOTE — Assessment & Plan Note (Signed)
On protonix now.  No problems reported.

## 2016-02-23 NOTE — Assessment & Plan Note (Signed)
Followed by Dr Dew.   

## 2016-02-23 NOTE — Assessment & Plan Note (Signed)
On lipitor.  Low cholesterol diet and exercise.  Follow lipid panel and liver function tests.   Lab Results  Component Value Date   CHOL 127 09/24/2015   HDL 32 (L) 09/24/2015   LDLCALC 73 09/24/2015   TRIG 111 09/24/2015   CHOLHDL 4.0 09/24/2015

## 2016-02-23 NOTE — Assessment & Plan Note (Signed)
Followed by [facility].   

## 2016-02-24 DIAGNOSIS — M7022 Olecranon bursitis, left elbow: Secondary | ICD-10-CM | POA: Diagnosis not present

## 2016-03-02 ENCOUNTER — Encounter (INDEPENDENT_AMBULATORY_CARE_PROVIDER_SITE_OTHER): Payer: Medicare Other

## 2016-03-02 ENCOUNTER — Ambulatory Visit (INDEPENDENT_AMBULATORY_CARE_PROVIDER_SITE_OTHER): Payer: Medicare Other | Admitting: Vascular Surgery

## 2016-03-07 ENCOUNTER — Other Ambulatory Visit: Payer: Medicare Other

## 2016-03-08 ENCOUNTER — Other Ambulatory Visit (INDEPENDENT_AMBULATORY_CARE_PROVIDER_SITE_OTHER): Payer: Medicare Other

## 2016-03-08 DIAGNOSIS — E039 Hypothyroidism, unspecified: Secondary | ICD-10-CM | POA: Diagnosis not present

## 2016-03-08 DIAGNOSIS — E7800 Pure hypercholesterolemia, unspecified: Secondary | ICD-10-CM | POA: Diagnosis not present

## 2016-03-08 DIAGNOSIS — I10 Essential (primary) hypertension: Secondary | ICD-10-CM | POA: Diagnosis not present

## 2016-03-08 LAB — HEPATIC FUNCTION PANEL
ALT: 13 U/L (ref 0–35)
AST: 19 U/L (ref 0–37)
Albumin: 4 g/dL (ref 3.5–5.2)
Alkaline Phosphatase: 59 U/L (ref 39–117)
BILIRUBIN DIRECT: 0.1 mg/dL (ref 0.0–0.3)
BILIRUBIN TOTAL: 0.6 mg/dL (ref 0.2–1.2)
Total Protein: 6.9 g/dL (ref 6.0–8.3)

## 2016-03-08 LAB — LIPID PANEL
CHOL/HDL RATIO: 4
CHOLESTEROL: 149 mg/dL (ref 0–200)
HDL: 38.1 mg/dL — ABNORMAL LOW (ref >39.00)
LDL CALC: 88 mg/dL (ref 0–99)
NonHDL: 110.54
TRIGLYCERIDES: 111 mg/dL (ref 0.0–149.0)
VLDL: 22.2 mg/dL (ref 0.0–40.0)

## 2016-03-08 LAB — TSH: TSH: 1.36 u[IU]/mL (ref 0.35–4.50)

## 2016-03-08 LAB — BASIC METABOLIC PANEL
BUN: 26 mg/dL — ABNORMAL HIGH (ref 6–23)
CHLORIDE: 108 meq/L (ref 96–112)
CO2: 29 meq/L (ref 19–32)
CREATININE: 1.11 mg/dL (ref 0.40–1.20)
Calcium: 10 mg/dL (ref 8.4–10.5)
GFR: 49.74 mL/min — ABNORMAL LOW (ref >60.00)
Glucose, Bld: 86 mg/dL (ref 70–99)
Potassium: 4.3 mEq/L (ref 3.5–5.1)
SODIUM: 142 meq/L (ref 135–145)

## 2016-03-09 ENCOUNTER — Encounter: Payer: Self-pay | Admitting: *Deleted

## 2016-03-11 DIAGNOSIS — M7022 Olecranon bursitis, left elbow: Secondary | ICD-10-CM | POA: Diagnosis not present

## 2016-03-14 ENCOUNTER — Telehealth: Payer: Self-pay | Admitting: Internal Medicine

## 2016-03-14 DIAGNOSIS — E7800 Pure hypercholesterolemia, unspecified: Secondary | ICD-10-CM

## 2016-03-14 NOTE — Telephone Encounter (Signed)
Pt called and is requesting a refill on carvedilol (COREG) 3.125 MG tablet(Expired). Please call once completed. Please advise, thank you!  [facility] - [place], [place]  Call pt @ [phone]

## 2016-03-16 MED ORDER — CARVEDILOL 3.125 MG PO TABS
3.1250 mg | ORAL_TABLET | Freq: Two times a day (BID) | ORAL | 1 refills | Status: DC
Start: 2016-03-16 — End: 2016-03-16

## 2016-03-16 NOTE — Telephone Encounter (Signed)
Patient informed. 

## 2016-03-16 NOTE — Telephone Encounter (Signed)
ok'd rx for coreg #180 with one refill.

## 2016-03-16 NOTE — Telephone Encounter (Signed)
Pt called back looking for a status update. Please advise, thank you!  Call pt @ [phone]

## 2016-03-16 NOTE — Telephone Encounter (Signed)
Medication in past has been written by cardiologist. She states that last office visit with you was told that you would give script. She would like to have 90 day supply called in to mail order if ok.

## 2016-03-17 MED ORDER — CARVEDILOL 3.125 MG PO TABS: 3.1250 mg | ORAL_TABLET | Freq: Two times a day (BID) | ORAL | 1 refills | Status: DC

## 2016-04-05 ENCOUNTER — Ambulatory Visit (INDEPENDENT_AMBULATORY_CARE_PROVIDER_SITE_OTHER): Payer: Medicare Other | Admitting: Internal Medicine

## 2016-04-05 ENCOUNTER — Encounter: Payer: Self-pay | Admitting: Internal Medicine

## 2016-04-05 VITALS — BP 134/72 | HR 59 | Temp 98.6°F | Resp 16 | Ht 59.0 in | Wt 130.2 lb

## 2016-04-05 DIAGNOSIS — K219 Gastro-esophageal reflux disease without esophagitis: Secondary | ICD-10-CM | POA: Diagnosis not present

## 2016-04-05 DIAGNOSIS — E039 Hypothyroidism, unspecified: Secondary | ICD-10-CM

## 2016-04-05 DIAGNOSIS — R413 Other amnesia: Secondary | ICD-10-CM

## 2016-04-05 DIAGNOSIS — I701 Atherosclerosis of renal artery: Secondary | ICD-10-CM

## 2016-04-05 DIAGNOSIS — M79602 Pain in left arm: Secondary | ICD-10-CM

## 2016-04-05 DIAGNOSIS — I10 Essential (primary) hypertension: Secondary | ICD-10-CM | POA: Diagnosis not present

## 2016-04-05 DIAGNOSIS — I739 Peripheral vascular disease, unspecified: Secondary | ICD-10-CM

## 2016-04-05 DIAGNOSIS — I779 Disorder of arteries and arterioles, unspecified: Secondary | ICD-10-CM

## 2016-04-05 DIAGNOSIS — E7800 Pure hypercholesterolemia, unspecified: Secondary | ICD-10-CM

## 2016-04-05 LAB — VITAMIN B12: Vitamin B-12: 389 pg/mL (ref 211–911)

## 2016-04-05 NOTE — Progress Notes (Signed)
Patient ID: ELODA CEDOTAL, female   DOB: 06-May-1929, 83 y.o.   MRN: [id number]   Subjective:    Patient ID: Julia Patterson, female    DOB: Jan 15, 1930, 83 y.o.   MRN: [id number]  HPI  Patient here for a scheduled follow up.  Last visit, she had fallen.  Injured her left arm.  Pain resolved.  Was evaluated at ortho on 03/14/16 and diagnosed with olecranon bursitis.  S/p aspiration and injection.   Doing well now.  No further falls.  Breathing stable.  No chest pain.  Saw Dr Fletcher Anon 01/2016.  Felt was having symptoms c/w GERD.  Started protonix.  States she is doing well.  No abdominal pain.  Bowels stable.     Past Medical History:  Diagnosis Date  . CAD in native artery    a. cath 09/24/15: p-mLAD 20%, pRCA 30%, mRCA 80% s/p PCI/DES 0%, nl EF by echo, LVEDP mod elevated  . Carotid artery occlusion   . Degenerative arthritis    lumbar spine.  spondylolisthesis, hip and leg pain, scoliosis  . Dyspnea on exertion    a. low-risk NST in 02/2014 b. echo 09/2015: EF 55% to 60%, no WMA, Grade 1 DD, mild AS, mild to moderate MS, mild MR, PA peak pressure of 45 mm Hg (S)  . GERD (gastroesophageal reflux disease)   . History of hiatal hernia   . Hypercholesterolemia   . Hypertension   . Hypothyroidism   . Nausea   . NSTEMI (non-ST elevated myocardial infarction) ([place]) 09/23/2015  . Osteoporosis    s/p fosamax, boniva, reclast, forteo.  bilateral spontaneous femur fractures  . Recurrent cystitis   . Shortness of breath dyspnea   . Urinary incontinence   . Varicose veins    chronic venous insufficiency   Past Surgical History:  Procedure Laterality Date  . ABDOMINAL HYSTERECTOMY    . BREAST REDUCTION SURGERY  1987  . CARDIAC CATHETERIZATION N/A 09/24/2015   Procedure: Left Heart Cath and Coronary Angiography;  Surgeon: Wellington Hampshire, MD;  Location: [facility];  Service: Cardiovascular;  Laterality: N/A;  . CARDIAC CATHETERIZATION N/A 09/24/2015   Procedure: Coronary Stent  Intervention;  Surgeon: Wellington Hampshire, MD;  Location: [facility];  Service: Cardiovascular;  Laterality: N/A;  . CARPAL TUNNEL RELEASE Left 09/21/2015   Procedure: CARPAL TUNNEL RELEASE;  Surgeon: Earnestine Leys, MD;  Location: [facility];  Service: Orthopedics;  Laterality: Left;  . hysterectomy and bladder repair  1998  . L5-L6 fusion  2/07  . TONSILLECTOMY AND ADENOIDECTOMY  1950  . trigger finger repair     right fourth  . uterus suspension and sterilization  1963  . varicose vein ligation  1972  . varicose vein sclerotherapy     Dr Hulda Humphrey   Family History  Problem Relation Age of Onset  . Rheumatic fever Mother     died age 68  . Heart attack Father   . Breast cancer Daughter   . Heart disease      father's family  . Diabetes      father's family   Social History   Social History  . Marital status: Single    Spouse name: N/A  . Number of children: N/A  . Years of education: N/A   Social History Main Topics  . Smoking status: Never Smoker  . Smokeless tobacco: Never Used  . Alcohol use No     Comment: rarely  . Drug use: No  .  Sexual activity: No   Other Topics Concern  . None   Social History Narrative  . None    Outpatient Encounter Prescriptions as of 04/05/2016  Medication Sig  . antiseptic oral rinse (BIOTENE) LIQD 15 mLs by Mouth Rinse route as needed for dry mouth.  Marland Kitchen aspirin EC 81 MG tablet Take 81 mg by mouth daily.   Marland Kitchen atorvastatin (LIPITOR) 40 MG tablet TAKE 1 TABLET EVERY DAY  AT  6 P.M.  . calcium-vitamin D (OSCAL WITH D) 500-200 MG-UNIT tablet Take 1 tablet by mouth 2 (two) times daily.  . carvedilol (COREG) 3.125 MG tablet Take 1 tablet (3.125 mg total) by mouth 2 (two) times daily.  . Cholecalciferol (VITAMIN D-1000 MAX ST) 1000 UNITS tablet Take 1,000 Units by mouth daily.   . clopidogrel (PLAVIX) 75 MG tablet Take 1 tablet (75 mg total) by mouth daily with breakfast.  . Coenzyme Q10 100 MG capsule Take 100 mg by mouth daily as  needed.  . fluticasone (FLONASE) 50 MCG/ACT nasal spray Place 1 spray into both nostrils daily as needed.   . Melatonin 3 MG TABS Take 3 mg by mouth at bedtime.   . Multiple Vitamin (MULTI-VITAMINS) TABS Take 1 tablet by mouth daily.   Marland Kitchen nystatin cream (MYCOSTATIN) Apply topically 2 (two) times daily. (Patient taking differently: Apply topically 2 (two) times daily as needed. )  . ondansetron (ZOFRAN) 4 MG tablet Take 4 mg by mouth every 8 (eight) hours as needed for nausea or vomiting.  Marland Kitchen oxybutynin (DITROPAN) 5 MG tablet Take 1 tablet (5 mg total) by mouth 2 (two) times daily.  . polyethylene glycol (MIRALAX / GLYCOLAX) packet Take 17 g by mouth daily as needed for mild constipation or moderate constipation.  . ranitidine (ZANTAC) 75 MG tablet Take 75 mg by mouth daily.  Marland Kitchen senna (SENOKOT) 8.6 MG TABS tablet Take 1 tablet by mouth daily as needed.   . sodium chloride (OCEAN) 0.65 % SOLN nasal spray Place 1 spray into both nostrils as needed for congestion.  . [DISCONTINUED] gabapentin (NEURONTIN) 400 MG capsule Take 1 capsule (400 mg total) by mouth 3 (three) times daily.  Marland Kitchen levothyroxine (SYNTHROID, LEVOTHROID) 88 MCG tablet Take 1 tablet (88 mcg total) by mouth daily before breakfast.  . [DISCONTINUED] cephALEXin (KEFLEX) 500 MG capsule Take 1 capsule (500 mg total) by mouth 3 (three) times daily. (Patient not taking: Reported on 04/05/2016)  . [DISCONTINUED] pantoprazole (PROTONIX) 40 MG tablet Take 1 tablet (40 mg total) by mouth daily.   No facility-administered encounter medications on file as of 04/05/2016.     Review of Systems  Constitutional: Negative for appetite change and unexpected weight change.  HENT: Negative for congestion and sinus pressure.   Respiratory: Negative for cough, chest tightness and shortness of breath.   Cardiovascular: Negative for chest pain, palpitations and leg swelling.  Gastrointestinal: Negative for abdominal pain, diarrhea, nausea and vomiting.    Genitourinary: Negative for difficulty urinating and dysuria.  Musculoskeletal: Negative for back pain and joint swelling.  Skin: Negative for color change and rash.  Neurological: Negative for dizziness, light-headedness and headaches.  Psychiatric/Behavioral: Negative for agitation and dysphoric mood.       Objective:    Physical Exam  Constitutional: She appears well-developed and well-nourished. No distress.  HENT:  Nose: Nose normal.  Mouth/Throat: Oropharynx is clear and moist.  Neck: Neck supple. No thyromegaly present.  Cardiovascular: Normal rate and regular rhythm.   Pulmonary/Chest: Breath sounds normal. No respiratory  distress. She has no wheezes.  Abdominal: Soft. Bowel sounds are normal. There is no tenderness.  Musculoskeletal: She exhibits no edema or tenderness.  Lymphadenopathy:    She has no cervical adenopathy.  Skin: No rash noted. No erythema.  Psychiatric: She has a normal mood and affect. Her behavior is normal.    BP 134/72 (BP Location: Right Arm, Patient Position: Sitting, Cuff Size: Large)   Pulse (!) 59   Temp 98.6 F (37 C) (Oral)   Resp 16   Ht 4\' 11"  (1.499 m)   Wt 130 lb 3.2 oz (59.1 kg)   SpO2 94%   BMI 26.30 kg/m  Wt Readings from Last 3 Encounters:  04/05/16 130 lb 3.2 oz (59.1 kg)  02/22/16 136 lb 6.4 oz (61.9 kg)  01/28/16 131 lb 8 oz (59.6 kg)     Lab Results  Component Value Date   WBC 9.1 10/13/2015   HGB 14.2 10/13/2015   HCT 41.1 10/13/2015   PLT 218 10/13/2015   GLUCOSE 86 03/08/2016   CHOL 149 03/08/2016   TRIG 111.0 03/08/2016   HDL 38.10 (L) 03/08/2016   LDLCALC 88 03/08/2016   ALT 13 03/08/2016   AST 19 03/08/2016   NA 142 03/08/2016   K 4.3 03/08/2016   CL 108 03/08/2016   CREATININE 1.11 03/08/2016   BUN 26 (H) 03/08/2016   CO2 29 03/08/2016   TSH 1.36 03/08/2016   INR 1.10 09/23/2015       Assessment & Plan:   Problem List Items Addressed This Visit    Carotid artery disease ([place])    Followed  by [facility].        Essential hypertension, benign    Blood pressure under good control.  Continue same medication regimen.  Follow pressures.  Follow metabolic panel.        GERD (gastroesophageal reflux disease)    On protonix.  No upper symptoms reported.  Follow.        Hypercholesterolemia    Low cholesterol diet and exercise.  On lipitor.  Follow lipid panel and liver function tests.        Hypothyroidism    On thyroid replacement.  Follow tsh.        Left arm pain    Resolved.        Renal artery stenosis ([place])    Followed by [facility]        Other Visit Diagnoses    Memory change    -  Primary   Relevant Orders   Vitamin B12 (Completed)       Einar Pheasant, MD

## 2016-04-05 NOTE — Progress Notes (Signed)
Pre-visit discussion using our clinic review tool. No additional management support is needed unless otherwise documented below in the visit note.  

## 2016-04-11 ENCOUNTER — Telehealth: Payer: Self-pay | Admitting: *Deleted

## 2016-04-11 NOTE — Telephone Encounter (Signed)
Pt requested to have a prescription refill for gabapentin sent to [facility] on [address]  Patient also needed advise on the proper compression stockings to purchase   pt contact [phone]

## 2016-04-12 NOTE — Telephone Encounter (Signed)
Looks like gabapentin given by ortho Dr. Sabra Heck in the past? Do they need script for compression stockings

## 2016-04-13 NOTE — Telephone Encounter (Signed)
If she is still seeing Dr Sabra Heck, I recommend him continuing to prescribe.  If no longer seeing or if a problem, let me know.

## 2016-04-13 NOTE — Telephone Encounter (Signed)
Pt would like a call back from you before the Rx is sent in please. Thank you!  Call pt @ [phone]. Or cell [phone].

## 2016-04-13 NOTE — Telephone Encounter (Signed)
No answer no vm

## 2016-04-13 NOTE — Telephone Encounter (Signed)
Per patient she seen Dr. Sabra Heck while she was in the hospital for injury she is not seeing him any longer. She is no longer seeing him per patient she is taking Gabapentin 400mg  tid

## 2016-04-13 NOTE — Telephone Encounter (Signed)
Sorry there has been a lot of added messages on this. I am not sure if I have the ok to refill or not. She is not being treated by Dr. Sabra Heck at this time.

## 2016-04-13 NOTE — Telephone Encounter (Signed)
Will need to clarify with pt before calling in.  Thanks

## 2016-04-14 MED ORDER — GABAPENTIN 400 MG PO CAPS: 400.0000 mg | ORAL_CAPSULE | Freq: Three times a day (TID) | ORAL | 2 refills | Status: DC

## 2016-04-14 NOTE — Telephone Encounter (Signed)
Noted.  Ok to call and cancel rx.

## 2016-04-14 NOTE — Telephone Encounter (Signed)
Pt called back following up. Please advise, thank you!  Call pt @ [phone]

## 2016-04-14 NOTE — Telephone Encounter (Signed)
I sent in rx for gabapentin 400mg  tid (given she has been taking regularly).

## 2016-04-14 NOTE — Telephone Encounter (Signed)
Called patient she states that she wanted to canceled script for gabapentin. She has one for mail order that has been processed. She also wanted to let you know that she did some research on the Internet about medication and feels like she is taking to much she is going to cut down to bid. She did that last night and feels like the swelling in legs has decreased so she is not interested in the compression stockings at this time. She will call if anything comes up and she has anymore questions.

## 2016-04-17 ENCOUNTER — Encounter: Payer: Self-pay | Admitting: Internal Medicine

## 2016-04-17 NOTE — Assessment & Plan Note (Signed)
Resolved

## 2016-04-17 NOTE — Assessment & Plan Note (Signed)
Followed by [facility].   

## 2016-04-17 NOTE — Assessment & Plan Note (Signed)
On protonix.  No upper symptoms reported.  Follow.  ?

## 2016-04-17 NOTE — Assessment & Plan Note (Signed)
Blood pressure under good control.  Continue same medication regimen.  Follow pressures.  Follow metabolic panel.   

## 2016-04-17 NOTE — Assessment & Plan Note (Signed)
Low cholesterol diet and exercise.  On lipitor.  Follow lipid panel and liver function tests.   

## 2016-04-17 NOTE — Assessment & Plan Note (Signed)
On thyroid replacement.  Follow tsh.  

## 2016-04-19 ENCOUNTER — Telehealth: Payer: Self-pay | Admitting: *Deleted

## 2016-04-19 ENCOUNTER — Telehealth: Payer: Self-pay | Admitting: Internal Medicine

## 2016-04-19 DIAGNOSIS — Z961 Presence of intraocular lens: Secondary | ICD-10-CM | POA: Diagnosis not present

## 2016-04-19 NOTE — Telephone Encounter (Signed)
Spoke to patient she is working on stopping gabapentin. She did not want 90 day supply she only wants #24 pills. She is currently taking 400mg  bid and next week plans on taking 470mb qd. Would like fo [place] to send to [facility] [address]. And let her know when it is done.

## 2016-04-19 NOTE — Telephone Encounter (Signed)
Pt called and wanted to speak with you in regards to medication. Please advise, thank you!  Call pt @ [phone]

## 2016-04-19 NOTE — Telephone Encounter (Signed)
Pt did not want refill I had called and verified

## 2016-04-19 NOTE — Telephone Encounter (Signed)
Requested medication refill for : gabapentin  Pharmacy:[facility]  Please Contact Pt when ready or sent to Pharmacy:  [phone]

## 2016-04-20 MED ORDER — GABAPENTIN 400 MG PO CAPS: ORAL_CAPSULE | ORAL | 0 refills | Status: DC

## 2016-04-20 NOTE — Telephone Encounter (Signed)
That refill was cancelled when I spoke to patient after I sent over. She would like 24 called in to local is that ok?

## 2016-04-20 NOTE — Telephone Encounter (Signed)
Pt informed has no questions

## 2016-04-20 NOTE — Telephone Encounter (Signed)
Sent in rx for 24 capsules of gabapentin.

## 2016-04-20 NOTE — Telephone Encounter (Signed)
In reviewing the chart, it appears that the rx for gabapentin #90 has already been sent in to the pharmacy.  Please notify pharmacy, pt only wants 24 (or 30 pills) - to only give her that number.  Thanks

## 2016-05-04 ENCOUNTER — Encounter (INDEPENDENT_AMBULATORY_CARE_PROVIDER_SITE_OTHER): Payer: Medicare Other

## 2016-05-04 ENCOUNTER — Ambulatory Visit (INDEPENDENT_AMBULATORY_CARE_PROVIDER_SITE_OTHER): Payer: Medicare Other | Admitting: Vascular Surgery

## 2016-05-23 ENCOUNTER — Ambulatory Visit
Admission: RE | Admit: 2016-05-23 | Discharge: 2016-05-23 | Disposition: A | Discharge status: Home / Self Care | Payer: Medicare Other | Source: Ambulatory Visit | Attending: Internal Medicine | Admitting: Internal Medicine

## 2016-05-23 DIAGNOSIS — M8588 Other specified disorders of bone density and structure, other site: Secondary | ICD-10-CM | POA: Insufficient documentation

## 2016-05-23 DIAGNOSIS — Z1231 Encounter for screening mammogram for malignant neoplasm of breast: Secondary | ICD-10-CM | POA: Insufficient documentation

## 2016-05-23 DIAGNOSIS — M85832 Other specified disorders of bone density and structure, left forearm: Secondary | ICD-10-CM | POA: Diagnosis not present

## 2016-05-23 DIAGNOSIS — M810 Age-related osteoporosis without current pathological fracture: Secondary | ICD-10-CM | POA: Diagnosis present

## 2016-05-23 DIAGNOSIS — Z1239 Encounter for other screening for malignant neoplasm of breast: Secondary | ICD-10-CM

## 2016-05-31 ENCOUNTER — Other Ambulatory Visit: Payer: Self-pay | Admitting: Internal Medicine

## 2016-06-02 ENCOUNTER — Telehealth: Payer: Self-pay | Admitting: Cardiovascular Disease

## 2016-06-02 NOTE — Telephone Encounter (Signed)
Patient may cancel appt tomorrow she is not feeling well. Patient says she is dry heaving and passing out.

## 2016-06-02 NOTE — Telephone Encounter (Signed)
Returned call to patient. She said she had used and consumed the eggs that were recently recalled from [place]. She is not sure if it is from that or not. Advised for her to contact her PCP if symptoms persist or she gets worse and to stay well-hydrated. She verbalized understanding and says she lives in a retirement home, so she can call them if she has an emergency.

## 2016-06-03 ENCOUNTER — Ambulatory Visit: Payer: Medicare Other | Admitting: Cardiovascular Disease

## 2016-06-21 ENCOUNTER — Other Ambulatory Visit (INDEPENDENT_AMBULATORY_CARE_PROVIDER_SITE_OTHER): Payer: Self-pay | Admitting: Vascular Surgery

## 2016-06-21 DIAGNOSIS — I701 Atherosclerosis of renal artery: Secondary | ICD-10-CM

## 2016-06-22 ENCOUNTER — Ambulatory Visit (INDEPENDENT_AMBULATORY_CARE_PROVIDER_SITE_OTHER): Payer: Medicare Other

## 2016-06-22 ENCOUNTER — Encounter (INDEPENDENT_AMBULATORY_CARE_PROVIDER_SITE_OTHER): Payer: Self-pay | Admitting: Vascular Surgery

## 2016-06-22 ENCOUNTER — Ambulatory Visit (INDEPENDENT_AMBULATORY_CARE_PROVIDER_SITE_OTHER): Payer: Medicare Other | Admitting: Vascular Surgery

## 2016-06-22 VITALS — BP 133/80 | HR 60 | Resp 15 | Ht 59.0 in | Wt 124.0 lb

## 2016-06-22 DIAGNOSIS — E7800 Pure hypercholesterolemia, unspecified: Secondary | ICD-10-CM

## 2016-06-22 DIAGNOSIS — I701 Atherosclerosis of renal artery: Secondary | ICD-10-CM

## 2016-06-22 DIAGNOSIS — I10 Essential (primary) hypertension: Secondary | ICD-10-CM | POA: Diagnosis not present

## 2016-06-22 NOTE — Progress Notes (Signed)
Subjective:    Patient ID: Julia Patterson, female    DOB: [birth year], 83 y.o.   MRN: [id number] Chief Complaint  Patient presents with  . Re-evaluation    6 month ultrsound follow up   Patient presents for a renal artery follow up. She was last seen in 02/2016. She reports good HTN control and no changes in her kidney function. The patient underwent a renal artery duplex exam which was notable 1-60% stenosis of the right renal artery, patent left renal artery. Atrophic left kidney. She denies any fever, nausea or vomiting. Of note, the patient is moving to [place] to be closer to her son and will not be following up.    Review of Systems  Constitutional: Negative.   HENT: Negative.   Eyes: Negative.   Respiratory: Negative.   Cardiovascular: Negative.   Gastrointestinal: Negative.   Endocrine: Negative.   Genitourinary: Negative.   Musculoskeletal: Negative.   Skin: Negative.   Allergic/Immunologic: Negative.   Neurological: Negative.   Hematological: Negative.   Psychiatric/Behavioral: Negative.       Objective:   Physical Exam  Constitutional: She is oriented to person, place, and time. She appears well-developed. No distress.  HENT:  Head: Normocephalic and atraumatic.  Eyes: Conjunctivae are normal. Pupils are equal, round, and reactive to light.  Neck: Normal range of motion.  Cardiovascular: Normal rate, regular rhythm, normal heart sounds and intact distal pulses.   Pulses:      Radial pulses are 2+ on the right side, and 2+ on the left side.       Dorsalis pedis pulses are 2+ on the right side, and 2+ on the left side.       Posterior tibial pulses are 2+ on the right side, and 2+ on the left side.  Pulmonary/Chest: Effort normal.  Musculoskeletal: Normal range of motion. She exhibits no edema.  Neurological: She is alert and oriented to person, place, and time.  Skin: Skin is warm and dry. She is not diaphoretic.  Psychiatric: She has a normal mood  and affect. Her behavior is normal. Judgment and thought content normal.  Vitals reviewed.  BP 133/80 (BP Location: Right Arm)   Pulse 60   Resp 15   Ht 4\' 11"  (1.499 m)   Wt 124 lb (56.2 kg)   BMI 25.04 kg/m   Past Medical History:  Diagnosis Date  . CAD in native artery    a. cath 09/24/15: p-mLAD 20%, pRCA 30%, mRCA 80% s/p PCI/DES 0%, nl EF by echo, LVEDP mod elevated  . Carotid artery occlusion   . Degenerative arthritis    lumbar spine.  spondylolisthesis, hip and leg pain, scoliosis  . Dyspnea on exertion    a. low-risk NST in 02/2014 b. echo 09/2015: EF 55% to 60%, no WMA, Grade 1 DD, mild AS, mild to moderate MS, mild MR, PA peak pressure of 45 mm Hg (S)  . GERD (gastroesophageal reflux disease)   . History of hiatal hernia   . Hypercholesterolemia   . Hypertension   . Hypothyroidism   . Nausea   . NSTEMI (non-ST elevated myocardial infarction) ([facility]) 09/23/2015  . Osteoporosis    s/p fosamax, boniva, reclast, forteo.  bilateral spontaneous femur fractures  . Recurrent cystitis   . Shortness of breath dyspnea   . Urinary incontinence   . Varicose veins    chronic venous insufficiency   Social History   Social History  . Marital status: Single    Spouse  name: N/A  . Number of children: N/A  . Years of education: N/A   Occupational History  . Not on file.   Social History Main Topics  . Smoking status: Never Smoker  . Smokeless tobacco: Never Used  . Alcohol use No     Comment: rarely  . Drug use: No  . Sexual activity: No   Other Topics Concern  . Not on file   Social History Narrative  . No narrative on file    Past Surgical History:  Procedure Laterality Date  . ABDOMINAL HYSTERECTOMY    . BREAST REDUCTION SURGERY  1987  . CARDIAC CATHETERIZATION N/A 09/24/2015   Procedure: Left Heart Cath and Coronary Angiography;  Surgeon: Wellington Hampshire, MD;  Location: [facility];  Service: Cardiovascular;  Laterality: N/A;  . CARDIAC  CATHETERIZATION N/A 09/24/2015   Procedure: Coronary Stent Intervention;  Surgeon: Wellington Hampshire, MD;  Location: [facility];  Service: Cardiovascular;  Laterality: N/A;  . CARPAL TUNNEL RELEASE Left 09/21/2015   Procedure: CARPAL TUNNEL RELEASE;  Surgeon: Earnestine Leys, MD;  Location: [facility];  Service: Orthopedics;  Laterality: Left;  . hysterectomy and bladder repair  1998  . L5-L6 fusion  2/07  . TONSILLECTOMY AND ADENOIDECTOMY  1950  . trigger finger repair     right fourth  . uterus suspension and sterilization  1963  . varicose vein ligation  1972  . varicose vein sclerotherapy     Dr Hulda Humphrey   Family History  Problem Relation Age of Onset  . Rheumatic fever Mother     died age 44  . Heart attack Father   . Breast cancer Daughter   . Heart disease      father's family  . Diabetes      father's family   Allergies  Allergen Reactions  . Thimerosal Rash      Assessment & Plan:  Patient presents for a renal artery follow up. She was last seen in 02/2016. She reports good HTN control and no changes in her kidney function. The patient underwent a renal artery duplex exam which was notable 1-60% stenosis of the right renal artery, patent left renal artery. Atrophic left kidney. She denies any fever, nausea or vomiting. Of note, the patient is moving to [place] to be closer to her son and will not be following up.   1. Renal artery stenosis (HCC) - Stable Stable duplex. No HTN issue. No kidney function issues. No intervention indicated at this time. Patient to remain abstinent of tobacco use. I have discussed with the patient at length the risk factors for and pathogenesis of atherosclerotic disease and encouraged a healthy diet, regular exercise regimen and blood pressure / glucose control.  Patient was instructed to contact our office in the interim with problems such as increasing / uncontrollable hypertension or changes in kidney function. The patient expresses  their understanding. Patient to follow up with new vascular surgeon in [place].  2. Hypercholesterolemia - Stable Encouraged good control as its slows the progression of atherosclerotic disease  3. Essential hypertension, benign - Stable Encouraged good control as its slows the progression of atherosclerotic disease  Current Outpatient Prescriptions on File Prior to Visit  Medication Sig Dispense Refill  . antiseptic oral rinse (BIOTENE) LIQD 15 mLs by Mouth Rinse route as needed for dry mouth.    Marland Kitchen aspirin EC 81 MG tablet Take 81 mg by mouth daily.     Marland Kitchen atorvastatin (LIPITOR) 40 MG  tablet TAKE 1 TABLET EVERY DAY  AT  6 P.M. 90 tablet 0  . calcium-vitamin D (OSCAL WITH D) 500-200 MG-UNIT tablet Take 1 tablet by mouth 2 (two) times daily.    . Cholecalciferol (VITAMIN D-1000 MAX ST) 1000 UNITS tablet Take 1,000 Units by mouth daily.     . clopidogrel (PLAVIX) 75 MG tablet TAKE 1 TABLET EVERY DAY WITH BREAKFAST 90 tablet 1  . Coenzyme Q10 100 MG capsule Take 100 mg by mouth daily as needed.    . fluticasone (FLONASE) 50 MCG/ACT nasal spray Place 1 spray into both nostrils daily as needed.     Marland Kitchen levothyroxine (SYNTHROID, LEVOTHROID) 88 MCG tablet TAKE 1 TABLET DAILY BEFORE BREAKFAST. 90 tablet 1  . Melatonin 3 MG TABS Take 3 mg by mouth at bedtime.     . Multiple Vitamin (MULTI-VITAMINS) TABS Take 1 tablet by mouth daily.     Marland Kitchen nystatin cream (MYCOSTATIN) Apply topically 2 (two) times daily. (Patient taking differently: Apply topically 2 (two) times daily as needed. ) 30 g 0  . ondansetron (ZOFRAN) 4 MG tablet Take 4 mg by mouth every 8 (eight) hours as needed for nausea or vomiting.    Marland Kitchen oxybutynin (DITROPAN) 5 MG tablet Take 1 tablet (5 mg total) by mouth 2 (two) times daily. 180 tablet 3  . polyethylene glycol (MIRALAX / GLYCOLAX) packet Take 17 g by mouth daily as needed for mild constipation or moderate constipation.    . ranitidine (ZANTAC) 75 MG tablet Take 75 mg by mouth daily.    Marland Kitchen senna  (SENOKOT) 8.6 MG TABS tablet Take 1 tablet by mouth daily as needed.     . sodium chloride (OCEAN) 0.65 % SOLN nasal spray Place 1 spray into both nostrils as needed for congestion.    . carvedilol (COREG) 3.125 MG tablet Take 1 tablet (3.125 mg total) by mouth 2 (two) times daily. 180 tablet 1   No current facility-administered medications on file prior to visit.     There are no Patient Instructions on file for this visit. No Follow-up on file.   Curlie Sittner A Landy Mace, PA-C

## 2016-06-23 ENCOUNTER — Encounter: Payer: Self-pay | Admitting: Pulmonary Disease

## 2016-06-23 ENCOUNTER — Ambulatory Visit (INDEPENDENT_AMBULATORY_CARE_PROVIDER_SITE_OTHER): Payer: Medicare Other | Admitting: Pulmonary Disease

## 2016-06-23 VITALS — BP 114/62 | HR 84 | Ht 59.0 in | Wt 124.0 lb

## 2016-06-23 DIAGNOSIS — R942 Abnormal results of pulmonary function studies: Secondary | ICD-10-CM

## 2016-06-23 DIAGNOSIS — R05 Cough: Secondary | ICD-10-CM

## 2016-06-23 DIAGNOSIS — R059 Cough, unspecified: Secondary | ICD-10-CM

## 2016-06-23 DIAGNOSIS — K449 Diaphragmatic hernia without obstruction or gangrene: Secondary | ICD-10-CM | POA: Diagnosis not present

## 2016-06-23 DIAGNOSIS — I701 Atherosclerosis of renal artery: Secondary | ICD-10-CM | POA: Diagnosis not present

## 2016-06-23 DIAGNOSIS — K219 Gastro-esophageal reflux disease without esophagitis: Secondary | ICD-10-CM | POA: Diagnosis not present

## 2016-06-23 DIAGNOSIS — R0609 Other forms of dyspnea: Secondary | ICD-10-CM | POA: Diagnosis not present

## 2016-06-23 NOTE — Patient Instructions (Signed)
Continue your current medication  Follow-up as needed

## 2016-06-24 ENCOUNTER — Ambulatory Visit
Admission: RE | Admit: 2016-06-24 | Discharge: 2016-06-24 | Disposition: A | Discharge status: Home / Self Care | Payer: Medicare Other | Source: Ambulatory Visit | Attending: Urology | Admitting: Urology

## 2016-06-24 ENCOUNTER — Ambulatory Visit (INDEPENDENT_AMBULATORY_CARE_PROVIDER_SITE_OTHER): Payer: Medicare Other | Admitting: Urology

## 2016-06-24 DIAGNOSIS — Z87440 Personal history of urinary (tract) infections: Secondary | ICD-10-CM

## 2016-06-24 DIAGNOSIS — N200 Calculus of kidney: Secondary | ICD-10-CM | POA: Diagnosis not present

## 2016-06-24 DIAGNOSIS — N3941 Urge incontinence: Secondary | ICD-10-CM | POA: Diagnosis not present

## 2016-06-24 DIAGNOSIS — I701 Atherosclerosis of renal artery: Secondary | ICD-10-CM | POA: Diagnosis not present

## 2016-06-24 DIAGNOSIS — N261 Atrophy of kidney (terminal): Secondary | ICD-10-CM

## 2016-06-24 DIAGNOSIS — N3281 Overactive bladder: Secondary | ICD-10-CM

## 2016-06-24 DIAGNOSIS — I700 Atherosclerosis of aorta: Secondary | ICD-10-CM | POA: Insufficient documentation

## 2016-06-24 NOTE — Progress Notes (Signed)
06/24/2016 2:09 PM   [name] 03-20-1929 [id number]  Referring provider: Einar Pheasant, [address] [place], [place] [address]  Chief Complaint  Patient presents with  . Follow-up    Nephrolithiasis    HPI: 83 yo F Returns to the office today for routine follow-up. In the interim, she had a heart attack and 09/2016 from that she's had a prolonged recovery.  Sometime over the summer, she is planning on moving out of the state to live with her son.  From a urology standpoint, she's done extremely well over the past year as below.  History of kidney stones Known left 14 x 4 mm left lower pole nonobstructing calculus, atrophic kidney x many years. She elected observation for this stone.  KUB today remains stable without significant change.  No flank pain or gross hematuria.    Recurrent urinary tract infections Previously on daily Macrobid since stopped. Remote history of pyelonephritis 30 years ago. Has never tried her used estrogen cream.  Since last year, she had NO UTIs.  Urge incontinence Baseline urinary urgency, frequency, nocturia 3 and urinary urge incontinence. She is previously tried imipramine 10 mg, Mybetriq 50 mg, Toviaz, tolteradine and oxybutynin. She was previously on tolterodine 4 mg which worked fairly well but insurance did not cover this. She has gone back to taking oxybutynin 5 mg twice a day. No stress urinary incontinence status post procedure by Dr. Ernst Spell at the time of hysterectomy.   She continues oxybutinin 5 mg bid.  This medication is very helpful.  She only gets up once per night.  Rare urge incontience.  No cognitive issues.  She does have fairly significant dry mouth from this medication and use a biotin and cough drops as needed.  PVR previously low.    Pelvic organ prolapse Used pessary in the past but not currently. Not bothered by prolapse.  PMH: Past Medical History:  Diagnosis Date  . CAD in native  artery    a. cath 09/24/15: p-mLAD 20%, pRCA 30%, mRCA 80% s/p PCI/DES 0%, nl EF by echo, LVEDP mod elevated  . Carotid artery occlusion   . Degenerative arthritis    lumbar spine.  spondylolisthesis, hip and leg pain, scoliosis  . Dyspnea on exertion    a. low-risk NST in 02/2014 b. echo 09/2015: EF 55% to 60%, no WMA, Grade 1 DD, mild AS, mild to moderate MS, mild MR, PA peak pressure of 45 mm Hg (S)  . GERD (gastroesophageal reflux disease)   . History of hiatal hernia   . Hypercholesterolemia   . Hypertension   . Hypothyroidism   . Nausea   . NSTEMI (non-ST elevated myocardial infarction) ([facility]) 09/23/2015  . Osteoporosis    s/p fosamax, boniva, reclast, forteo.  bilateral spontaneous femur fractures  . Recurrent cystitis   . Shortness of breath dyspnea   . Urinary incontinence   . Varicose veins    chronic venous insufficiency    Surgical History: Past Surgical History:  Procedure Laterality Date  . ABDOMINAL HYSTERECTOMY    . BREAST REDUCTION SURGERY  1987  . CARDIAC CATHETERIZATION N/A 09/24/2015   Procedure: Left Heart Cath and Coronary Angiography;  Surgeon: Wellington Hampshire, MD;  Location: [facility];  Service: Cardiovascular;  Laterality: N/A;  . CARDIAC CATHETERIZATION N/A 09/24/2015   Procedure: Coronary Stent Intervention;  Surgeon: Wellington Hampshire, MD;  Location: [facility];  Service: Cardiovascular;  Laterality: N/A;  . CARPAL TUNNEL RELEASE  Left 09/21/2015   Procedure: CARPAL TUNNEL RELEASE;  Surgeon: Earnestine Leys, MD;  Location: [facility];  Service: Orthopedics;  Laterality: Left;  . hysterectomy and bladder repair  1998  . L5-L6 fusion  2/07  . TONSILLECTOMY AND ADENOIDECTOMY  1950  . trigger finger repair     right fourth  . uterus suspension and sterilization  1963  . varicose vein ligation  1972  . varicose vein sclerotherapy     Dr Hulda Humphrey    Home Medications:  Allergies as of 06/24/2016      Reactions   Thimerosal Rash        Medication List       Accurate as of 06/24/16 11:59 PM. Always use your most recent med list.          antiseptic oral rinse Liqd 15 mLs by Mouth Rinse route as needed for dry mouth.   aspirin EC 81 MG tablet Take 81 mg by mouth daily.   atorvastatin 40 MG tablet Commonly known as:  LIPITOR TAKE 1 TABLET EVERY DAY  AT  6 P.M.   calcium-vitamin D 500-200 MG-UNIT tablet Commonly known as:  OSCAL WITH D Take 1 tablet by mouth 2 (two) times daily.   carvedilol 3.125 MG tablet Commonly known as:  COREG Take 1 tablet (3.125 mg total) by mouth 2 (two) times daily.   clopidogrel 75 MG tablet Commonly known as:  PLAVIX TAKE 1 TABLET EVERY DAY WITH BREAKFAST   Coenzyme Q10 100 MG capsule Take 100 mg by mouth daily as needed.   fluticasone 50 MCG/ACT nasal spray Commonly known as:  FLONASE Place 1 spray into both nostrils daily as needed.   levothyroxine 88 MCG tablet Commonly known as:  SYNTHROID, LEVOTHROID TAKE 1 TABLET DAILY BEFORE BREAKFAST.   Melatonin 3 MG Tabs Take 3 mg by mouth at bedtime.   MULTI-VITAMINS Tabs Take 1 tablet by mouth daily.   nystatin cream Commonly known as:  MYCOSTATIN Apply topically 2 (two) times daily.   omeprazole 20 MG capsule Commonly known as:  PRILOSEC   ondansetron 4 MG tablet Commonly known as:  ZOFRAN Take 4 mg by mouth every 8 (eight) hours as needed for nausea or vomiting.   oxybutynin 5 MG tablet Commonly known as:  DITROPAN Take 1 tablet (5 mg total) by mouth 2 (two) times daily.   polyethylene glycol packet Commonly known as:  MIRALAX / GLYCOLAX Take 17 g by mouth daily as needed for mild constipation or moderate constipation.   ranitidine 75 MG tablet Commonly known as:  ZANTAC Take 75 mg by mouth daily.   senna 8.6 MG Tabs tablet Commonly known as:  SENOKOT Take 1 tablet by mouth daily as needed.   sodium chloride 0.65 % Soln nasal spray Commonly known as:  OCEAN Place 1 spray into both nostrils as needed  for congestion.   VITAMIN D-1000 MAX ST 1000 units tablet Generic drug:  Cholecalciferol Take 1,000 Units by mouth daily.       Allergies:  Allergies  Allergen Reactions  . Thimerosal Rash    Family History: Family History  Problem Relation Age of Onset  . Rheumatic fever Mother        died age 72  . Heart attack Father   . Breast cancer Daughter   . Heart disease Unknown        father's family  . Diabetes Unknown        father's family    Social History:  reports  that she has never smoked. She has never used smokeless tobacco. She reports that she does not drink alcohol or use drugs.  ROS: UROLOGY Frequent Urination?: No Hard to postpone urination?: No Burning/pain with urination?: No Get up at night to urinate?: Yes Leakage of urine?: Yes Urine stream starts and stops?: No Trouble starting stream?: No Do you have to strain to urinate?: No Blood in urine?: No Urinary tract infection?: No Sexually transmitted disease?: No Injury to kidneys or bladder?: No Painful intercourse?: No Weak stream?: No Currently pregnant?: No Vaginal bleeding?: No Last menstrual period?: n  Gastrointestinal Nausea?: No Vomiting?: No Indigestion/heartburn?: Yes Diarrhea?: No Constipation?: Yes  Constitutional Fever: No Night sweats?: No Weight loss?: No Fatigue?: Yes  Skin Skin rash/lesions?: No Itching?: No  Eyes Blurred vision?: No Double vision?: No  Ears/Nose/Throat Sore throat?: No Sinus problems?: No  Hematologic/Lymphatic Swollen glands?: No Easy bruising?: Yes  Cardiovascular Leg swelling?: No Chest pain?: No  Respiratory Cough?: Yes Shortness of breath?: Yes  Endocrine Excessive thirst?: No  Musculoskeletal Back pain?: No Joint pain?: Yes  Neurological Headaches?: No Dizziness?: No  Psychologic Depression?: No Anxiety?: No  Physical Exam: See epic for vitals.   Constitutional:  Alert and oriented, No acute distress.  Ambulating  slowly with cane. HEENT: [place] AT, moist mucus membranes.  Trachea midline, no masses. Cardiovascular: No clubbing, cyanosis, or edema. Respiratory: Normal respiratory effort, no increased work of breathing. Abd: Soft, nontender, no masses. GU: No CVA tenderness bilaterally. Skin: No rashes, bruises or suspicious lesions. Neurologic: Grossly intact, no focal deficits, moving all 4 extremities. Psychiatric: Normal mood and affect.  Laboratory Data: Lab Results  Component Value Date   WBC 9.1 10/13/2015   HGB 14.2 10/13/2015   HCT 41.1 10/13/2015   MCV 92.7 10/13/2015   PLT 218 10/13/2015    Lab Results  Component Value Date   CREATININE 1.11 03/08/2016    Urinalysis n/a  Pertinent Imaging: CLINICAL DATA:  Nephrolithiasis.  EXAM: ABDOMEN - 1 VIEW  COMPARISON:  Radiographs of April 22, 2014. CT scan of May 10, 2012. Ultrasound of Jun 27, 2015.  FINDINGS: The bowel gas pattern is normal. Stable nonobstructive calculus is seen overlying left kidney. Atherosclerosis of abdominal aorta is noted. Phleboliths are noted in the pelvis. Calculi are seen in right upper quadrant consistent with cholelithiasis.  IMPRESSION: Stable left renal calculus. Aortic atherosclerosis. No evidence of bowel obstruction or ileus. Probable cholelithiasis.   Electronically Signed   By: Marijo Conception, M.D.   On: 06/24/2016 12:58  KUB reviewed. These images were personally compared to previous KUBs.  Assessment & Plan:    1. Nephrolithiasis Stable left atrophic kidney with asymptomatic nonobstructing stones Intervention previously discussed in the past, prefers conservative management with observation Remains asymptomatic  2. Atrophy of left kidney Chronic  3. Urge incontinence of urine Currently on oxybutynin 5 mg twice daily. Post void residuals previously minimal Discussed side effects and concern for cognitive issues (advised to let know around her to know to look out  for confusion, etc), previously failed multiple other anticholinergic medications and beta 3 agonist  4. OAB (overactive bladder) As above  5. History of recurrent UTIs No recent UTIs, doing well  Return in about 1 year (around 06/24/2017) for KUB if does not move to [place].  Hollice Espy, MD  [facility] [address], [place], [place] [address] [phone]

## 2016-06-26 NOTE — Progress Notes (Signed)
PULMONARY OFFICE FOLLOW UP NOTE  Formerly seen by Dr Stevenson Clinch for DOE  PROBLEMS:  DOE Diastolic HF Mild restrictive physiology - likely due to severe kyphosis and very large HH with atlectasis  DATA: PFT 05/21/14: no obstruction, mild restriction, severe reduction in DLCO CT chest 09/23/15: very large HH, mild compressive atelectasis. No parenchymal lung disease TTE 09/23/15: LVEF 22-57%, grade 2 diastolic dysfunction, mildly dilated LA, mild pulmonary hypertension (RVSP est 35 mmHg)   INTERVAL HISTORY: No major events  SUBJ: Continues to have moderate DOE. Denies CP, fever, purulent sputum, hemoptysis, LE edema and calf tenderness. Reports minimal, occasional NP cough. No new complaints  OBJ: Vitals:   06/23/16 1000  BP: 114/62  Pulse: 84  SpO2: 97%  Weight: 124 lb (56.2 kg)  Height: 4\' 11"  (1.499 m)    Gen: WDWN in NAD HEENT: NCAT, sclerae white, oropharynx normal Neck: NO LAN, no JVD noted Lungs: full BS, normal percussion note throughout, no adventitious sounds Cardiovascular: Reg rate, normal rhythm, + systolic M of aortic origin Abdomen: Soft, NT, +BS Ext: no C/C/E Neuro: PERRL, EOMI, motor/sensory grossly intact Skin: No lesions noted    DATA: As above  IMPRESSION: DOE - mostly attributable to CHFpEF (grade II diastolic dysfunction) Mild restrictive pattern present on PFT - due to kyphoscoliosis, ATX Very large hiatal hernia Mild cough, likely due to GERD  PLAN: Continue current medical regimen Follow up as needed   Merton Border, MD PCCM service Mobile [phone] Pager [phone] 06/26/2016

## 2016-07-11 ENCOUNTER — Other Ambulatory Visit: Payer: Self-pay | Admitting: Internal Medicine

## 2016-07-19 ENCOUNTER — Encounter: Payer: Self-pay | Admitting: Internal Medicine

## 2016-07-19 ENCOUNTER — Ambulatory Visit (INDEPENDENT_AMBULATORY_CARE_PROVIDER_SITE_OTHER): Payer: Medicare Other | Admitting: Internal Medicine

## 2016-07-19 VITALS — BP 158/70 | HR 70 | Temp 98.6°F | Resp 12 | Ht 59.0 in | Wt 124.2 lb

## 2016-07-19 DIAGNOSIS — I779 Disorder of arteries and arterioles, unspecified: Secondary | ICD-10-CM

## 2016-07-19 DIAGNOSIS — R634 Abnormal weight loss: Secondary | ICD-10-CM

## 2016-07-19 DIAGNOSIS — N179 Acute kidney failure, unspecified: Secondary | ICD-10-CM

## 2016-07-19 DIAGNOSIS — E039 Hypothyroidism, unspecified: Secondary | ICD-10-CM | POA: Diagnosis not present

## 2016-07-19 DIAGNOSIS — I2510 Atherosclerotic heart disease of native coronary artery without angina pectoris: Secondary | ICD-10-CM

## 2016-07-19 DIAGNOSIS — K219 Gastro-esophageal reflux disease without esophagitis: Secondary | ICD-10-CM

## 2016-07-19 DIAGNOSIS — I700 Atherosclerosis of aorta: Secondary | ICD-10-CM | POA: Diagnosis not present

## 2016-07-19 DIAGNOSIS — I701 Atherosclerosis of renal artery: Secondary | ICD-10-CM | POA: Diagnosis not present

## 2016-07-19 DIAGNOSIS — E7800 Pure hypercholesterolemia, unspecified: Secondary | ICD-10-CM | POA: Diagnosis not present

## 2016-07-19 DIAGNOSIS — N3281 Overactive bladder: Secondary | ICD-10-CM | POA: Diagnosis not present

## 2016-07-19 DIAGNOSIS — I10 Essential (primary) hypertension: Secondary | ICD-10-CM

## 2016-07-19 DIAGNOSIS — I739 Peripheral vascular disease, unspecified: Secondary | ICD-10-CM

## 2016-07-19 NOTE — Progress Notes (Signed)
Pre-visit discussion using our clinic review tool. No additional management support is needed unless otherwise documented below in the visit note.  

## 2016-07-19 NOTE — Progress Notes (Signed)
Patient ID: Julia Patterson, female   DOB: August 05, 1929, 83 y.o.   MRN: [id number]   Subjective:    Patient ID: Julia Patterson, female    DOB: 1929/05/28, 83 y.o.   MRN: [id number]  HPI  Patient with past history of CAD, GERD, hypertension, hypercholesterolemia and hypothyroidism.  She comes in today to follow up on these issues as well as for a complete physical exam.  She reports some weight loss. Discussed with her today.  She is eating.  No nausea or vomiting.  No diarrhea or bowel change.  Some occasional blood in mucus from her nose.  No significant nose bleeds.  She has a history of kidney stones.  Just evaluated by urology 06/24/16.  Felt stable.  Saw Dr Alva Garnet.  Mild restrictive pattern on PFTs.  Has a very large hiatal hernia.  This is felt to be contributing to her breathing issues.  Overall breathing stable.  Saw vascular surgery 06/22/16 - felt stable.  She is moving to [place].  Planning to move in with her son.  Some increased stress related to this.  She feels she is handling things relatively well.     Past Medical History:  Diagnosis Date  . CAD in native artery    a. cath 09/24/15: p-mLAD 20%, pRCA 30%, mRCA 80% s/p PCI/DES 0%, nl EF by echo, LVEDP mod elevated  . Carotid artery occlusion   . Degenerative arthritis    lumbar spine.  spondylolisthesis, hip and leg pain, scoliosis  . Dyspnea on exertion    a. low-risk NST in 02/2014 b. echo 09/2015: EF 55% to 60%, no WMA, Grade 1 DD, mild AS, mild to moderate MS, mild MR, PA peak pressure of 45 mm Hg (S)  . GERD (gastroesophageal reflux disease)   . History of hiatal hernia   . Hypercholesterolemia   . Hypertension   . Hypothyroidism   . Nausea   . NSTEMI (non-ST elevated myocardial infarction) ([place]) 09/23/2015  . Osteoporosis    s/p fosamax, boniva, reclast, forteo.  bilateral spontaneous femur fractures  . Recurrent cystitis   . Shortness of breath dyspnea   . Urinary incontinence   . Varicose veins    chronic venous insufficiency   Past Surgical History:  Procedure Laterality Date  . ABDOMINAL HYSTERECTOMY    . BREAST REDUCTION SURGERY  1987  . CARDIAC CATHETERIZATION N/A 09/24/2015   Procedure: Left Heart Cath and Coronary Angiography;  Surgeon: Wellington Hampshire, MD;  Location: [facility];  Service: Cardiovascular;  Laterality: N/A;  . CARDIAC CATHETERIZATION N/A 09/24/2015   Procedure: Coronary Stent Intervention;  Surgeon: Wellington Hampshire, MD;  Location: [facility];  Service: Cardiovascular;  Laterality: N/A;  . CARPAL TUNNEL RELEASE Left 09/21/2015   Procedure: CARPAL TUNNEL RELEASE;  Surgeon: Earnestine Leys, MD;  Location: [facility];  Service: Orthopedics;  Laterality: Left;  . hysterectomy and bladder repair  1998  . L5-L6 fusion  2/07  . TONSILLECTOMY AND ADENOIDECTOMY  1950  . trigger finger repair     right fourth  . uterus suspension and sterilization  1963  . varicose vein ligation  1972  . varicose vein sclerotherapy     Dr Hulda Humphrey   Family History  Problem Relation Age of Onset  . Rheumatic fever Mother        died age 27  . Heart attack Father   . Breast cancer Daughter   . Heart disease Unknown  father's family  . Diabetes Unknown        father's family   Social History   Social History  . Marital status: Single    Spouse name: N/A  . Number of children: N/A  . Years of education: N/A   Social History Main Topics  . Smoking status: Never Smoker  . Smokeless tobacco: Never Used  . Alcohol use No     Comment: rarely  . Drug use: No  . Sexual activity: No   Other Topics Concern  . None   Social History Narrative  . None    Outpatient Encounter Prescriptions as of 07/19/2016  Medication Sig  . antiseptic oral rinse (BIOTENE) LIQD 15 mLs by Mouth Rinse route as needed for dry mouth.  Marland Kitchen aspirin EC 81 MG tablet Take 81 mg by mouth daily.   Marland Kitchen atorvastatin (LIPITOR) 40 MG tablet TAKE 1 TABLET EVERY DAY AT 6PM  . calcium-vitamin D  (OSCAL WITH D) 500-200 MG-UNIT tablet Take 1 tablet by mouth 2 (two) times daily.  . Cholecalciferol (VITAMIN D-1000 MAX ST) 1000 UNITS tablet Take 1,000 Units by mouth daily.   . clopidogrel (PLAVIX) 75 MG tablet TAKE 1 TABLET EVERY DAY WITH BREAKFAST  . Coenzyme Q10 100 MG capsule Take 100 mg by mouth daily as needed.  . fluticasone (FLONASE) 50 MCG/ACT nasal spray Place 1 spray into both nostrils daily as needed.   Marland Kitchen levothyroxine (SYNTHROID, LEVOTHROID) 88 MCG tablet TAKE 1 TABLET DAILY BEFORE BREAKFAST.  . Melatonin 3 MG TABS Take 3 mg by mouth at bedtime.   . Multiple Vitamin (MULTI-VITAMINS) TABS Take 1 tablet by mouth daily.   Marland Kitchen nystatin cream (MYCOSTATIN) Apply topically 2 (two) times daily.  Marland Kitchen omeprazole (PRILOSEC) 20 MG capsule   . ondansetron (ZOFRAN) 4 MG tablet Take 4 mg by mouth every 8 (eight) hours as needed for nausea or vomiting.  Marland Kitchen oxybutynin (DITROPAN) 5 MG tablet Take 1 tablet (5 mg total) by mouth 2 (two) times daily.  . polyethylene glycol (MIRALAX / GLYCOLAX) packet Take 17 g by mouth daily as needed for mild constipation or moderate constipation.  . ranitidine (ZANTAC) 75 MG tablet Take 75 mg by mouth daily.  Marland Kitchen senna (SENOKOT) 8.6 MG TABS tablet Take 1 tablet by mouth daily as needed.   . sodium chloride (OCEAN) 0.65 % SOLN nasal spray Place 1 spray into both nostrils as needed for congestion.  . carvedilol (COREG) 3.125 MG tablet Take 1 tablet (3.125 mg total) by mouth 2 (two) times daily.  . [DISCONTINUED] oxybutynin (DITROPAN) 5 MG tablet Take 1 tablet (5 mg total) by mouth 2 (two) times daily.   No facility-administered encounter medications on file as of 07/19/2016.     Review of Systems  Constitutional: Negative for appetite change.       Some weight loss as outlined.  Weight stable recently.  Eating.    HENT: Negative for congestion and sinus pressure.   Respiratory: Negative for cough and chest tightness.        Breathing stable.    Cardiovascular: Negative  for chest pain, palpitations and leg swelling.  Gastrointestinal: Negative for abdominal pain, diarrhea, nausea and vomiting.  Genitourinary: Negative for difficulty urinating and dysuria.  Musculoskeletal: Negative for joint swelling and myalgias.  Skin: Negative for color change and rash.  Neurological: Negative for dizziness, light-headedness and headaches.  Psychiatric/Behavioral: Negative for agitation and dysphoric mood.       Objective:     Blood pressure  rechecked by me:  138-140/78  Physical Exam  Constitutional: She appears well-developed and well-nourished. No distress.  HENT:  Nose: Nose normal.  Mouth/Throat: Oropharynx is clear and moist.  Neck: Neck supple. No thyromegaly present.  Cardiovascular: Normal rate and regular rhythm.   Pulmonary/Chest: Breath sounds normal. No respiratory distress. She has no wheezes.  Abdominal: Soft. Bowel sounds are normal. There is no tenderness.  Musculoskeletal: She exhibits no edema or tenderness.  Lymphadenopathy:    She has no cervical adenopathy.  Skin: No rash noted. No erythema.  Psychiatric: She has a normal mood and affect. Her behavior is normal.    BP (!) 158/70 (BP Location: Right Arm, Patient Position: Sitting, Cuff Size: Normal)   Pulse 70   Temp 98.6 F (37 C) (Oral)   Resp 12   Ht 4\' 11"  (1.499 m)   Wt 124 lb 3.2 oz (56.3 kg)   SpO2 95%   BMI 25.09 kg/m  Wt Readings from Last 3 Encounters:  07/19/16 124 lb 3.2 oz (56.3 kg)  06/23/16 124 lb (56.2 kg)  06/22/16 124 lb (56.2 kg)     Lab Results  Component Value Date   WBC 9.1 10/13/2015   HGB 14.2 10/13/2015   HCT 41.1 10/13/2015   PLT 218 10/13/2015   GLUCOSE 86 03/08/2016   CHOL 149 03/08/2016   TRIG 111.0 03/08/2016   HDL 38.10 (L) 03/08/2016   LDLCALC 88 03/08/2016   ALT 13 03/08/2016   AST 19 03/08/2016   NA 142 03/08/2016   K 4.3 03/08/2016   CL 108 03/08/2016   CREATININE 1.11 03/08/2016   BUN 26 (H) 03/08/2016   CO2 29 03/08/2016    TSH 1.36 03/08/2016   INR 1.10 09/23/2015    Dg Abd 1 View  Result Date: 06/24/2016 CLINICAL DATA:  Nephrolithiasis. EXAM: ABDOMEN - 1 VIEW COMPARISON:  Radiographs of April 22, 2014. CT scan of May 10, 2012. Ultrasound of Jun 27, 2015. FINDINGS: The bowel gas pattern is normal. Stable nonobstructive calculus is seen overlying left kidney. Atherosclerosis of abdominal aorta is noted. Phleboliths are noted in the pelvis. Calculi are seen in right upper quadrant consistent with cholelithiasis. IMPRESSION: Stable left renal calculus. Aortic atherosclerosis. No evidence of bowel obstruction or ileus. Probable cholelithiasis. Electronically Signed   By: Marijo Conception, M.D.   On: 06/24/2016 12:58       Assessment & Plan:   Problem List Items Addressed This Visit    Acute renal failure ([place])    Follow metabolic panel.  Avoid antiinflammatories.        Aortic atherosclerosis ([facility])   CAD in native artery    Followed by cardiology.  Continue risk factor modification.  Followed by Dr Fletcher Anon.        Carotid artery disease ([place])    Followed by [facility].  Just evaluated 06/22/16.        Essential hypertension, benign    On recheck improved.  Follow.  Same medication regimen.        Relevant Orders   Basic metabolic panel   GERD (gastroesophageal reflux disease)    Controlled on protonix.  Follow.       Hypercholesterolemia    Low cholesterol diet and exercise.  Follow lipid panel.        Relevant Orders   Hepatic function panel   Lipid panel   Hypothyroidism    On thyroid replacement.  Follow tsh.         Other Visit Diagnoses  Weight loss    -  Primary   stable recently.  previous weight loss. she is eating well.  no GI symptoms.  desires to monitor.  notify me if changes.  will need to follow up with her move.   OAB (overactive bladder)       Relevant Medications   oxybutynin (DITROPAN) 5 MG tablet       Einar Pheasant, MD

## 2016-07-21 ENCOUNTER — Encounter: Payer: Self-pay | Admitting: Internal Medicine

## 2016-07-21 DIAGNOSIS — I700 Atherosclerosis of aorta: Secondary | ICD-10-CM | POA: Insufficient documentation

## 2016-07-21 MED ORDER — OXYBUTYNIN CHLORIDE 5 MG PO TABS: 5.0000 mg | ORAL_TABLET | Freq: Two times a day (BID) | ORAL | 0 refills | Status: AC

## 2016-07-21 NOTE — Assessment & Plan Note (Signed)
Followed by [facility].  Just evaluated 06/22/16.

## 2016-07-21 NOTE — Assessment & Plan Note (Signed)
Controlled on protonix.  Follow.   

## 2016-07-21 NOTE — Assessment & Plan Note (Signed)
On thyroid replacement.  Follow tsh.  

## 2016-07-21 NOTE — Assessment & Plan Note (Signed)
Followed by cardiology.  Continue risk factor modification.  Followed by Dr Fletcher Anon.

## 2016-07-21 NOTE — Assessment & Plan Note (Signed)
On recheck improved.  Follow.  Same medication regimen.

## 2016-07-21 NOTE — Assessment & Plan Note (Signed)
Low cholesterol diet and exercise.  Follow lipid panel.   

## 2016-07-21 NOTE — Assessment & Plan Note (Signed)
Follow metabolic panel.  Avoid antiinflammatories.   

## 2016-07-25 ENCOUNTER — Ambulatory Visit (INDEPENDENT_AMBULATORY_CARE_PROVIDER_SITE_OTHER): Payer: Medicare Other | Admitting: Cardiovascular Disease

## 2016-07-25 ENCOUNTER — Encounter: Payer: Self-pay | Admitting: Cardiovascular Disease

## 2016-07-25 VITALS — BP 110/60 | HR 71 | Ht 59.0 in | Wt 124.2 lb

## 2016-07-25 DIAGNOSIS — I701 Atherosclerosis of renal artery: Secondary | ICD-10-CM

## 2016-07-25 DIAGNOSIS — I5032 Chronic diastolic (congestive) heart failure: Secondary | ICD-10-CM

## 2016-07-25 DIAGNOSIS — R011 Cardiac murmur, unspecified: Secondary | ICD-10-CM

## 2016-07-25 DIAGNOSIS — I10 Essential (primary) hypertension: Secondary | ICD-10-CM | POA: Diagnosis not present

## 2016-07-25 DIAGNOSIS — E785 Hyperlipidemia, unspecified: Secondary | ICD-10-CM | POA: Diagnosis not present

## 2016-07-25 DIAGNOSIS — I2510 Atherosclerotic heart disease of native coronary artery without angina pectoris: Secondary | ICD-10-CM | POA: Diagnosis not present

## 2016-07-25 NOTE — Progress Notes (Signed)
Cardiology Office Note   Date:  07/25/2016   ID:  Julia Patterson, DOB 17-May-1929, MRN [id number]  PCP:  Einar Pheasant, MD  Cardiologist:   Kathlyn Sacramento, MD   Chief Complaint  Patient presents with  . other    4 month follow up. Meds reviewed by the pt. verbally. Pt. c/o feeling fatigue, shortness of breath with little to no activity and leg weakness.       History of Present Illness: Julia Patterson is a 83 y.o. female who presents for a follow-up visit for coronary artery disease. She has multiple chronic medical conditions that include hyperlipidemia, hypothyroidism, asymptomatic carotid stenosis, renal artery stenosis and left bundle branch block.  She had non-ST elevation myocardial infarction in August 2017. Cardiac catheterization showed 80% hazy stenosis in the midright coronary artery with mild disease affecting the LAD. She underwent successful angioplasty and drug-eluting stent placement to the right coronary artery.  Echocardiogram showed normal LV systolic function with grade 2 diastolic dysfunction, moderate mitral regurgitation and mild-to-moderate tricuspid regurgitation. She had issues with volume overload that improved with furosemide.  She has been doing well overall with no chest pain. She reports shortness of breath and thinks it is related to his hiatal hernia. She will be moving to [place] in the near future.   Past Medical History:  Diagnosis Date  . CAD in native artery    a. cath 09/24/15: p-mLAD 20%, pRCA 30%, mRCA 80% s/p PCI/DES 0%, nl EF by echo, LVEDP mod elevated  . Carotid artery occlusion   . Degenerative arthritis    lumbar spine.  spondylolisthesis, hip and leg pain, scoliosis  . Dyspnea on exertion    a. low-risk NST in 02/2014 b. echo 09/2015: EF 55% to 60%, no WMA, Grade 1 DD, mild AS, mild to moderate MS, mild MR, PA peak pressure of 45 mm Hg (S)  . GERD (gastroesophageal reflux disease)   . History of hiatal hernia     . Hypercholesterolemia   . Hypertension   . Hypothyroidism   . Nausea   . NSTEMI (non-ST elevated myocardial infarction) ([place]) 09/23/2015  . Osteoporosis    s/p fosamax, boniva, reclast, forteo.  bilateral spontaneous femur fractures  . Recurrent cystitis   . Shortness of breath dyspnea   . Urinary incontinence   . Varicose veins    chronic venous insufficiency    Past Surgical History:  Procedure Laterality Date  . ABDOMINAL HYSTERECTOMY    . BREAST REDUCTION SURGERY  1987  . CARDIAC CATHETERIZATION N/A 09/24/2015   Procedure: Left Heart Cath and Coronary Angiography;  Surgeon: Wellington Hampshire, MD;  Location: [facility];  Service: Cardiovascular;  Laterality: N/A;  . CARDIAC CATHETERIZATION N/A 09/24/2015   Procedure: Coronary Stent Intervention;  Surgeon: Wellington Hampshire, MD;  Location: [facility];  Service: Cardiovascular;  Laterality: N/A;  . CARPAL TUNNEL RELEASE Left 09/21/2015   Procedure: CARPAL TUNNEL RELEASE;  Surgeon: Earnestine Leys, MD;  Location: [facility];  Service: Orthopedics;  Laterality: Left;  . hysterectomy and bladder repair  1998  . L5-L6 fusion  2/07  . TONSILLECTOMY AND ADENOIDECTOMY  1950  . trigger finger repair     right fourth  . uterus suspension and sterilization  1963  . varicose vein ligation  1972  . varicose vein sclerotherapy     Dr Hulda Humphrey     Current Outpatient Prescriptions  Medication Sig Dispense Refill  . antiseptic oral rinse (BIOTENE) LIQD  15 mLs by Mouth Rinse route as needed for dry mouth.    Marland Kitchen aspirin EC 81 MG tablet Take 81 mg by mouth daily.     Marland Kitchen atorvastatin (LIPITOR) 40 MG tablet TAKE 1 TABLET EVERY DAY AT 6PM 90 tablet 0  . calcium-vitamin D (OSCAL WITH D) 500-200 MG-UNIT tablet Take 1 tablet by mouth 2 (two) times daily.    . carvedilol (COREG) 3.125 MG tablet Take 1 tablet (3.125 mg total) by mouth 2 (two) times daily. 180 tablet 1  . Cholecalciferol (VITAMIN D-1000 MAX ST) 1000 UNITS tablet Take 1,000  Units by mouth daily.     . clopidogrel (PLAVIX) 75 MG tablet TAKE 1 TABLET EVERY DAY WITH BREAKFAST 90 tablet 1  . Coenzyme Q10 100 MG capsule Take 100 mg by mouth daily as needed.    . fluticasone (FLONASE) 50 MCG/ACT nasal spray Place 1 spray into both nostrils daily as needed.     Marland Kitchen levothyroxine (SYNTHROID, LEVOTHROID) 88 MCG tablet TAKE 1 TABLET DAILY BEFORE BREAKFAST. 90 tablet 1  . Melatonin 3 MG TABS Take 3 mg by mouth at bedtime.     . Multiple Vitamin (MULTI-VITAMINS) TABS Take 1 tablet by mouth daily.     Marland Kitchen nystatin cream (MYCOSTATIN) Apply topically 2 (two) times daily. 30 g 0  . omeprazole (PRILOSEC) 20 MG capsule     . ondansetron (ZOFRAN) 4 MG tablet Take 4 mg by mouth every 8 (eight) hours as needed for nausea or vomiting.    Marland Kitchen oxybutynin (DITROPAN) 5 MG tablet Take 1 tablet (5 mg total) by mouth 2 (two) times daily. 180 tablet 0  . polyethylene glycol (MIRALAX / GLYCOLAX) packet Take 17 g by mouth daily as needed for mild constipation or moderate constipation.    . ranitidine (ZANTAC) 75 MG tablet Take 75 mg by mouth daily.    Marland Kitchen senna (SENOKOT) 8.6 MG TABS tablet Take 1 tablet by mouth daily as needed.     . sodium chloride (OCEAN) 0.65 % SOLN nasal spray Place 1 spray into both nostrils as needed for congestion.     No current facility-administered medications for this visit.     Allergies:   Thimerosal    Social History:  The patient  reports that she has never smoked. She has never used smokeless tobacco. She reports that she does not drink alcohol or use drugs.   Family History:  The patient's family history includes Breast cancer in her daughter; Heart attack in her father; Rheumatic fever in her mother.    ROS:  Please see the history of present illness.   Otherwise, review of systems are positive for none.   All other systems are reviewed and negative.    PHYSICAL EXAM: VS:  BP 110/60 (BP Location: Left Arm, Patient Position: Sitting, Cuff Size: Normal)    Pulse 71   Ht 4\' 11"  (1.499 m)   Wt 124 lb 4 oz (56.4 kg)   BMI 25.10 kg/m  , BMI Body mass index is 25.1 kg/m. GEN: Well nourished, well developed, in no acute distress  HEENT: normal  Neck: no JVD, , or masses. Left carotid bruit. Cardiac: RRR; no rubs, or gallops,no edema . 2 / 6 mid peaking systolic murmur in the aortic area and left sternal border.  Respiratory:  clear to auscultation bilaterally, normal work of breathing GI: soft, nontender, nondistended, + BS MS: no deformity or atrophy  Skin: warm and dry, no rash Neuro:  Strength and sensation are  intact Psych: euthymic mood, full affect   EKG:  EKG is ordered today. EKG showed normal sinus rhythm with left bundle branch block.    Recent Labs: 09/28/2015: Magnesium 2.1 10/13/2015: Hemoglobin 14.2; Platelets 218 03/08/2016: ALT 13; BUN 26; Creatinine, Ser 1.11; Potassium 4.3; Sodium 142; TSH 1.36    Lipid Panel    Component Value Date/Time   CHOL 149 03/08/2016 1009   TRIG 111.0 03/08/2016 1009   HDL 38.10 (L) 03/08/2016 1009   CHOLHDL 4 03/08/2016 1009   VLDL 22.2 03/08/2016 1009   LDLCALC 88 03/08/2016 1009      Wt Readings from Last 3 Encounters:  07/25/16 124 lb 4 oz (56.4 kg)  07/19/16 124 lb 3.2 oz (56.3 kg)  06/23/16 124 lb (56.2 kg)        ASSESSMENT AND PLAN:  1.  Coronary artery disease involving native coronary arteries: Status post RCA PCI with a drug-eluting stent placement in August 2017.No anginal symptoms. She then discontinue Plavix by the end of August.  2. Chronic diastolic heart failure: She appears to be euvolemic and she is no longer on furosemide. He continues to have exertional dyspnea and has a heart murmur suggestive of aortic stenosis. I requested an echocardiogram.   3. Asymptomatic carotid disease: Followed by [facility].   4. Essential hypertension: Blood pressure is well-controlled.   5. Hyperlipidemia: Currently on atorvastatin 40 mg once daily. Most recent LDL was 88 which is  close to target. We should consider switching atorvastatin.Marland Kitchen   Disposition:   FU with me as needed.   Signed,  Kathlyn Sacramento, MD  07/25/2016 2:17 PM    [facility]

## 2016-07-25 NOTE — Patient Instructions (Addendum)
Medication Instructions:  Your physician has recommended you make the following change in your medication:  STOP taking plavix AT THE END OF AUGUST   Labwork: none  Testing/Procedures: Your physician has requested that you have an echocardiogram. Echocardiography is a painless test that uses sound waves to create images of your heart. It provides your doctor with information about the size and shape of your heart and how well your heart's chambers and valves are working. This procedure takes approximately one hour. There are no restrictions for this procedure.    Follow-Up: Your physician recommends that you schedule a follow-up appointment as needed.     Any Other Special Instructions Will Be Listed Below (If Applicable).     If you need a refill on your cardiac medications before your next appointment, please call your pharmacy.  Echocardiogram An echocardiogram, or echocardiography, uses sound waves (ultrasound) to produce an image of your heart. The echocardiogram is simple, painless, obtained within a short period of time, and offers valuable information to your health care provider. The images from an echocardiogram can provide information such as:  Evidence of coronary artery disease (CAD).  Heart size.  Heart muscle function.  Heart valve function.  Aneurysm detection.  Evidence of a past heart attack.  Fluid buildup around the heart.  Heart muscle thickening.  Assess heart valve function.  Tell a health care provider about:  Any allergies you have.  All medicines you are taking, including vitamins, herbs, eye drops, creams, and over-the-counter medicines.  Any problems you or family members have had with anesthetic medicines.  Any blood disorders you have.  Any surgeries you have had.  Any medical conditions you have.  Whether you are pregnant or may be pregnant. What happens before the procedure? No special preparation is needed. Eat and drink  normally. What happens during the procedure?  In order to produce an image of your heart, gel will be applied to your chest and a wand-like tool (transducer) will be moved over your chest. The gel will help transmit the sound waves from the transducer. The sound waves will harmlessly bounce off your heart to allow the heart images to be captured in real-time motion. These images will then be recorded.  You may need an IV to receive a medicine that improves the quality of the pictures. What happens after the procedure? You may return to your normal schedule including diet, activities, and medicines, unless your health care provider tells you otherwise. This information is not intended to replace advice given to you by your health care provider. Make sure you discuss any questions you have with your health care provider. Document Released: 01/29/2000 Document Revised: 09/19/2015 Document Reviewed: 10/08/2012 Elsevier Interactive Patient Education  2017 [employer].

## 2016-07-29 DIAGNOSIS — M1711 Unilateral primary osteoarthritis, right knee: Secondary | ICD-10-CM | POA: Diagnosis not present

## 2016-08-16 ENCOUNTER — Telehealth: Payer: Self-pay | Admitting: Internal Medicine

## 2016-08-16 ENCOUNTER — Other Ambulatory Visit: Payer: Self-pay

## 2016-08-16 MED ORDER — LEVOTHYROXINE SODIUM 88 MCG PO TABS
88.0000 ug | ORAL_TABLET | Freq: Every day | ORAL | 1 refills | Status: DC
Start: 2016-08-16 — End: 2017-06-10

## 2016-08-16 NOTE — Telephone Encounter (Signed)
Pt called requesting a refill on her levothyroxine (SYNTHROID, LEVOTHROID) 88 MCG tablet. Please advise, thank you!  [facility] [place], [place]

## 2016-08-16 NOTE — Telephone Encounter (Signed)
Sent to mail order

## 2016-08-29 ENCOUNTER — Other Ambulatory Visit: Payer: Medicare Other

## 2016-08-31 ENCOUNTER — Other Ambulatory Visit (INDEPENDENT_AMBULATORY_CARE_PROVIDER_SITE_OTHER): Payer: Medicare Other

## 2016-08-31 DIAGNOSIS — E7800 Pure hypercholesterolemia, unspecified: Secondary | ICD-10-CM

## 2016-08-31 DIAGNOSIS — I10 Essential (primary) hypertension: Secondary | ICD-10-CM

## 2016-08-31 LAB — LIPID PANEL
CHOLESTEROL: 125 mg/dL (ref 0–200)
HDL: 39.9 mg/dL (ref >39.00)
LDL Cholesterol: 68 mg/dL (ref 0–99)
NonHDL: 85.41
Total CHOL/HDL Ratio: 3
Triglycerides: 87 mg/dL (ref 0.0–149.0)
VLDL: 17.4 mg/dL (ref 0.0–40.0)

## 2016-08-31 LAB — HEPATIC FUNCTION PANEL
ALBUMIN: 3.8 g/dL (ref 3.5–5.2)
ALT: 13 U/L (ref 0–35)
AST: 16 U/L (ref 0–37)
Alkaline Phosphatase: 46 U/L (ref 39–117)
Bilirubin, Direct: 0.1 mg/dL (ref 0.0–0.3)
TOTAL PROTEIN: 6.3 g/dL (ref 6.0–8.3)
Total Bilirubin: 0.7 mg/dL (ref 0.2–1.2)

## 2016-08-31 LAB — BASIC METABOLIC PANEL
BUN: 25 mg/dL — ABNORMAL HIGH (ref 6–23)
CO2: 27 meq/L (ref 19–32)
Calcium: 10.1 mg/dL (ref 8.4–10.5)
Chloride: 108 mEq/L (ref 96–112)
Creatinine, Ser: 1.12 mg/dL (ref 0.40–1.20)
GFR: 49.17 mL/min — AB (ref >60.00)
GLUCOSE: 92 mg/dL (ref 70–99)
POTASSIUM: 4.2 meq/L (ref 3.5–5.1)
SODIUM: 142 meq/L (ref 135–145)

## 2016-09-02 ENCOUNTER — Other Ambulatory Visit: Payer: Self-pay

## 2016-09-02 ENCOUNTER — Ambulatory Visit (INDEPENDENT_AMBULATORY_CARE_PROVIDER_SITE_OTHER): Payer: Medicare Other

## 2016-09-02 DIAGNOSIS — R011 Cardiac murmur, unspecified: Secondary | ICD-10-CM

## 2016-09-13 ENCOUNTER — Other Ambulatory Visit: Payer: Self-pay | Admitting: Internal Medicine

## 2016-09-29 DIAGNOSIS — K30 Functional dyspepsia: Secondary | ICD-10-CM | POA: Diagnosis not present

## 2016-09-29 DIAGNOSIS — Z7902 Long term (current) use of antithrombotics/antiplatelets: Secondary | ICD-10-CM | POA: Diagnosis not present

## 2016-09-29 DIAGNOSIS — I252 Old myocardial infarction: Secondary | ICD-10-CM | POA: Diagnosis not present

## 2016-09-29 DIAGNOSIS — E785 Hyperlipidemia, unspecified: Secondary | ICD-10-CM | POA: Diagnosis not present

## 2016-09-29 DIAGNOSIS — R32 Unspecified urinary incontinence: Secondary | ICD-10-CM | POA: Diagnosis not present

## 2016-09-29 DIAGNOSIS — E039 Hypothyroidism, unspecified: Secondary | ICD-10-CM | POA: Diagnosis not present

## 2016-10-18 ENCOUNTER — Other Ambulatory Visit: Payer: Self-pay

## 2016-10-18 MED ORDER — CARVEDILOL 3.125 MG PO TABS: 3.1250 mg | ORAL_TABLET | Freq: Two times a day (BID) | ORAL | 0 refills | Status: AC

## 2016-10-19 DIAGNOSIS — E785 Hyperlipidemia, unspecified: Secondary | ICD-10-CM | POA: Diagnosis not present

## 2016-10-19 DIAGNOSIS — I10 Essential (primary) hypertension: Secondary | ICD-10-CM | POA: Diagnosis not present

## 2016-10-19 DIAGNOSIS — I447 Left bundle-branch block, unspecified: Secondary | ICD-10-CM | POA: Diagnosis not present

## 2016-10-19 DIAGNOSIS — I2510 Atherosclerotic heart disease of native coronary artery without angina pectoris: Secondary | ICD-10-CM | POA: Diagnosis not present

## 2016-12-06 DIAGNOSIS — R350 Frequency of micturition: Secondary | ICD-10-CM | POA: Diagnosis not present

## 2016-12-07 DIAGNOSIS — N390 Urinary tract infection, site not specified: Secondary | ICD-10-CM | POA: Diagnosis not present

## 2016-12-21 ENCOUNTER — Ambulatory Visit: Payer: Medicare Other

## 2016-12-21 DIAGNOSIS — H04541 Stenosis of right lacrimal canaliculi: Secondary | ICD-10-CM | POA: Diagnosis not present

## 2016-12-22 DIAGNOSIS — M79645 Pain in left finger(s): Secondary | ICD-10-CM | POA: Diagnosis not present

## 2016-12-22 DIAGNOSIS — M65322 Trigger finger, left index finger: Secondary | ICD-10-CM | POA: Diagnosis not present

## 2017-01-03 DIAGNOSIS — E039 Hypothyroidism, unspecified: Secondary | ICD-10-CM | POA: Diagnosis not present

## 2017-01-03 DIAGNOSIS — M1990 Unspecified osteoarthritis, unspecified site: Secondary | ICD-10-CM | POA: Diagnosis not present

## 2017-01-03 DIAGNOSIS — K219 Gastro-esophageal reflux disease without esophagitis: Secondary | ICD-10-CM | POA: Diagnosis not present

## 2017-01-03 DIAGNOSIS — M810 Age-related osteoporosis without current pathological fracture: Secondary | ICD-10-CM | POA: Diagnosis not present

## 2017-01-03 DIAGNOSIS — E785 Hyperlipidemia, unspecified: Secondary | ICD-10-CM | POA: Diagnosis not present

## 2017-01-03 DIAGNOSIS — Z23 Encounter for immunization: Secondary | ICD-10-CM | POA: Diagnosis not present

## 2017-01-03 DIAGNOSIS — I10 Essential (primary) hypertension: Secondary | ICD-10-CM | POA: Diagnosis not present

## 2017-01-10 DIAGNOSIS — E039 Hypothyroidism, unspecified: Secondary | ICD-10-CM | POA: Diagnosis not present

## 2017-01-10 DIAGNOSIS — M1990 Unspecified osteoarthritis, unspecified site: Secondary | ICD-10-CM | POA: Diagnosis not present

## 2017-01-10 DIAGNOSIS — I10 Essential (primary) hypertension: Secondary | ICD-10-CM | POA: Diagnosis not present

## 2017-01-10 DIAGNOSIS — E785 Hyperlipidemia, unspecified: Secondary | ICD-10-CM | POA: Diagnosis not present

## 2017-01-10 DIAGNOSIS — M810 Age-related osteoporosis without current pathological fracture: Secondary | ICD-10-CM | POA: Diagnosis not present

## 2017-01-10 DIAGNOSIS — K219 Gastro-esophageal reflux disease without esophagitis: Secondary | ICD-10-CM | POA: Diagnosis not present

## 2017-01-23 DIAGNOSIS — I10 Essential (primary) hypertension: Secondary | ICD-10-CM | POA: Diagnosis not present

## 2017-01-23 DIAGNOSIS — E785 Hyperlipidemia, unspecified: Secondary | ICD-10-CM | POA: Diagnosis not present

## 2017-01-23 DIAGNOSIS — I25118 Atherosclerotic heart disease of native coronary artery with other forms of angina pectoris: Secondary | ICD-10-CM | POA: Diagnosis not present

## 2017-01-24 DIAGNOSIS — M65322 Trigger finger, left index finger: Secondary | ICD-10-CM | POA: Diagnosis not present

## 2017-02-21 DIAGNOSIS — Z01818 Encounter for other preprocedural examination: Secondary | ICD-10-CM | POA: Diagnosis not present

## 2017-02-21 DIAGNOSIS — M65322 Trigger finger, left index finger: Secondary | ICD-10-CM | POA: Diagnosis not present

## 2017-03-13 DIAGNOSIS — Z95818 Presence of other cardiac implants and grafts: Secondary | ICD-10-CM | POA: Diagnosis not present

## 2017-03-13 DIAGNOSIS — Z981 Arthrodesis status: Secondary | ICD-10-CM | POA: Diagnosis not present

## 2017-03-13 DIAGNOSIS — I252 Old myocardial infarction: Secondary | ICD-10-CM | POA: Diagnosis not present

## 2017-03-13 DIAGNOSIS — K219 Gastro-esophageal reflux disease without esophagitis: Secondary | ICD-10-CM | POA: Diagnosis not present

## 2017-03-13 DIAGNOSIS — M65322 Trigger finger, left index finger: Secondary | ICD-10-CM | POA: Diagnosis not present

## 2017-03-13 DIAGNOSIS — Z7982 Long term (current) use of aspirin: Secondary | ICD-10-CM | POA: Diagnosis not present

## 2017-03-13 DIAGNOSIS — E039 Hypothyroidism, unspecified: Secondary | ICD-10-CM | POA: Diagnosis not present

## 2017-03-13 DIAGNOSIS — M109 Gout, unspecified: Secondary | ICD-10-CM | POA: Diagnosis not present

## 2017-03-13 DIAGNOSIS — E785 Hyperlipidemia, unspecified: Secondary | ICD-10-CM | POA: Diagnosis not present

## 2017-03-13 DIAGNOSIS — M810 Age-related osteoporosis without current pathological fracture: Secondary | ICD-10-CM | POA: Diagnosis not present

## 2017-03-13 DIAGNOSIS — Z79899 Other long term (current) drug therapy: Secondary | ICD-10-CM | POA: Diagnosis not present

## 2017-03-14 DIAGNOSIS — M65322 Trigger finger, left index finger: Secondary | ICD-10-CM | POA: Diagnosis not present

## 2017-03-28 DIAGNOSIS — M65322 Trigger finger, left index finger: Secondary | ICD-10-CM | POA: Diagnosis not present

## 2017-03-31 DIAGNOSIS — H04121 Dry eye syndrome of right lacrimal gland: Secondary | ICD-10-CM | POA: Diagnosis not present

## 2017-04-25 DIAGNOSIS — I779 Disorder of arteries and arterioles, unspecified: Secondary | ICD-10-CM | POA: Diagnosis not present

## 2017-04-25 DIAGNOSIS — I10 Essential (primary) hypertension: Secondary | ICD-10-CM | POA: Diagnosis not present

## 2017-04-25 DIAGNOSIS — I252 Old myocardial infarction: Secondary | ICD-10-CM | POA: Diagnosis not present

## 2017-04-25 DIAGNOSIS — I2510 Atherosclerotic heart disease of native coronary artery without angina pectoris: Secondary | ICD-10-CM | POA: Diagnosis not present

## 2017-04-25 DIAGNOSIS — I447 Left bundle-branch block, unspecified: Secondary | ICD-10-CM | POA: Diagnosis not present

## 2017-04-25 DIAGNOSIS — E785 Hyperlipidemia, unspecified: Secondary | ICD-10-CM | POA: Diagnosis not present

## 2017-05-23 DIAGNOSIS — H43813 Vitreous degeneration, bilateral: Secondary | ICD-10-CM | POA: Diagnosis not present

## 2017-06-10 ENCOUNTER — Other Ambulatory Visit: Payer: Self-pay | Admitting: Internal Medicine

## 2017-06-12 NOTE — Telephone Encounter (Signed)
Patient was last seen on 07-19-16 and does not have an appt. Scheduled, last TSH was 03-08-16. Would you like to refill?

## 2017-06-13 NOTE — Telephone Encounter (Signed)
Approved.  

## 2018-01-25 ENCOUNTER — Other Ambulatory Visit: Payer: Self-pay | Admitting: Internal Medicine

## 2018-05-03 ENCOUNTER — Other Ambulatory Visit: Payer: Self-pay | Admitting: Internal Medicine

## 2018-05-11 IMAGING — CR DG CHEST 2V
2 series · 3 of 3 positions shown · non-contrast
Comparison: 09/25/2015

CLINICAL DATA: Bilateral lower extremity swelling for 3-4 days.
Intermittent chest pain.

EXAM:
CHEST  2 VIEW

[Series 1: chest pa · 0.14mm/px · 2 of 2 slices shown]
[im 1/2]
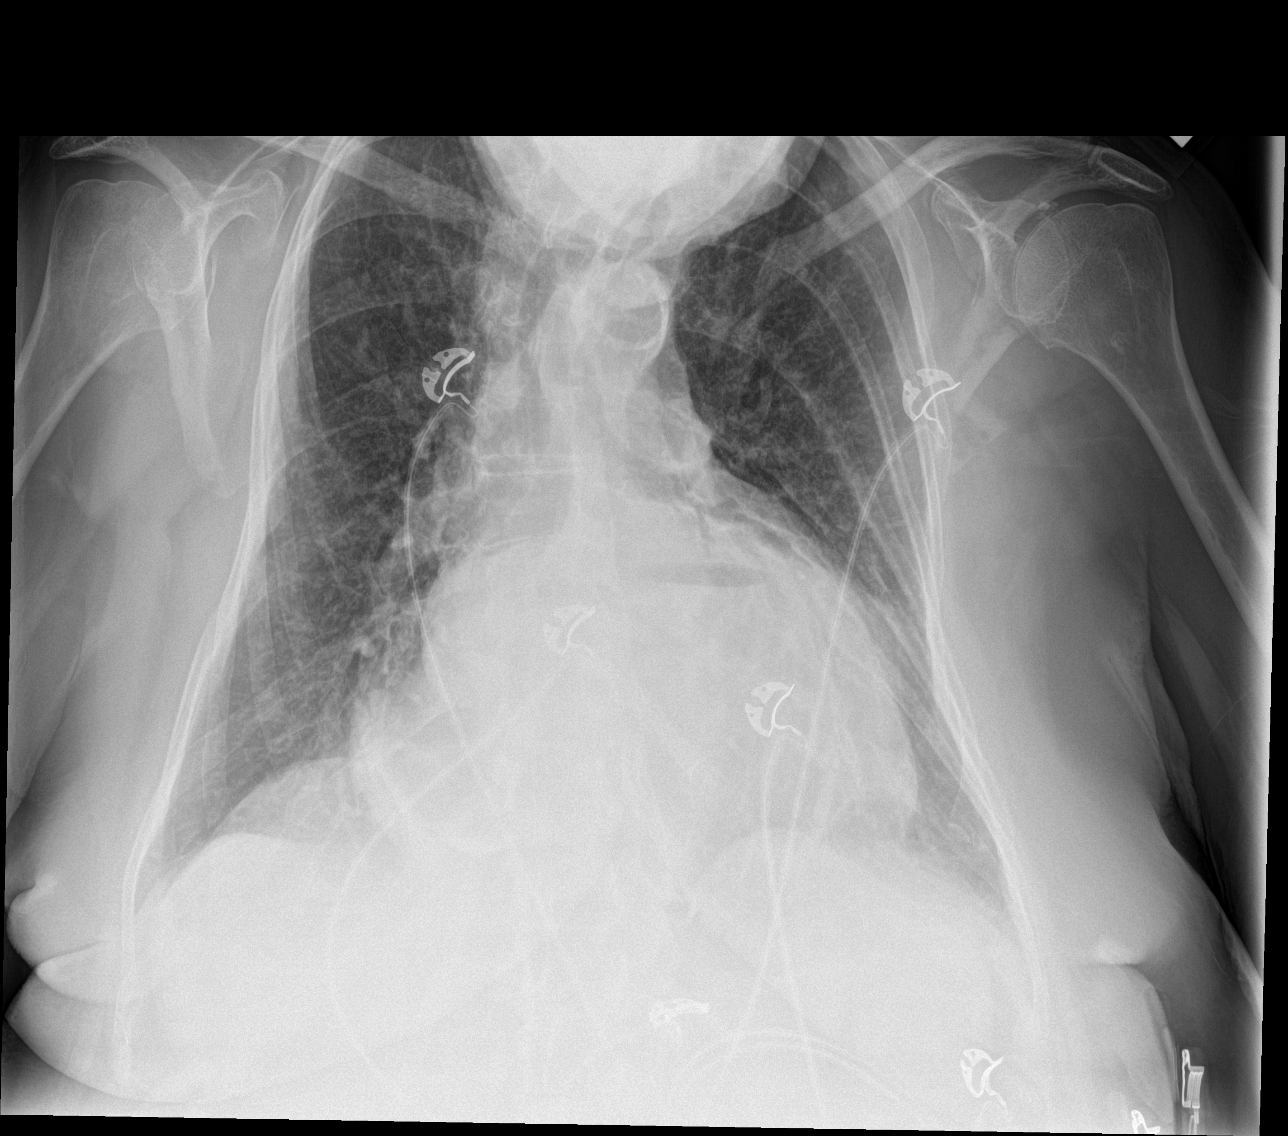
[im 2/2]
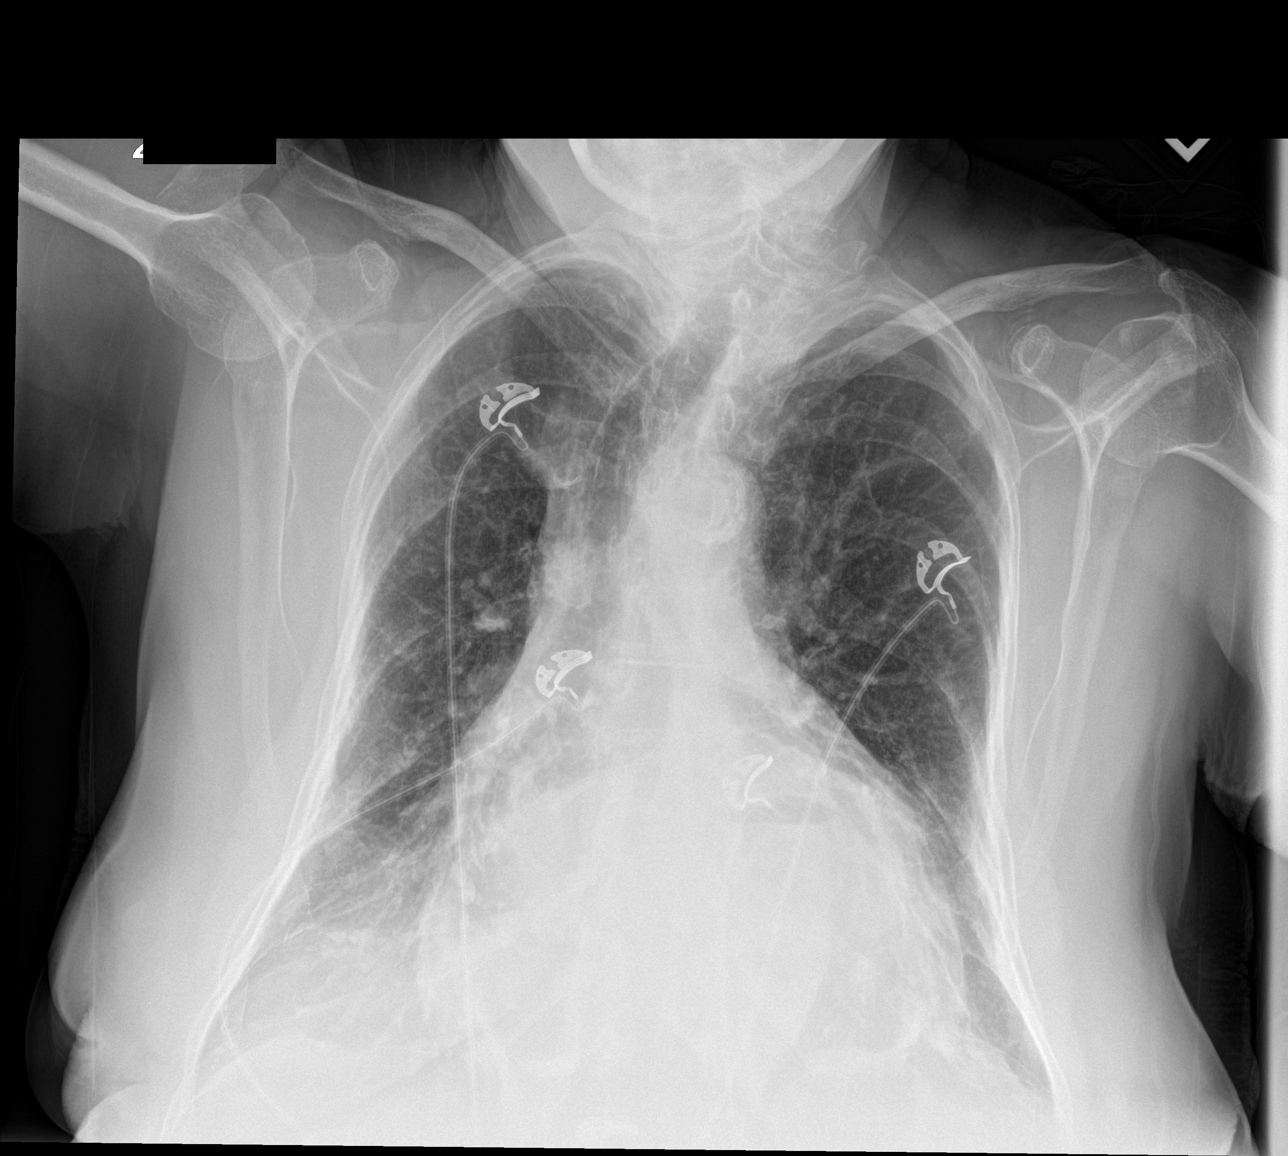

[chest lat]
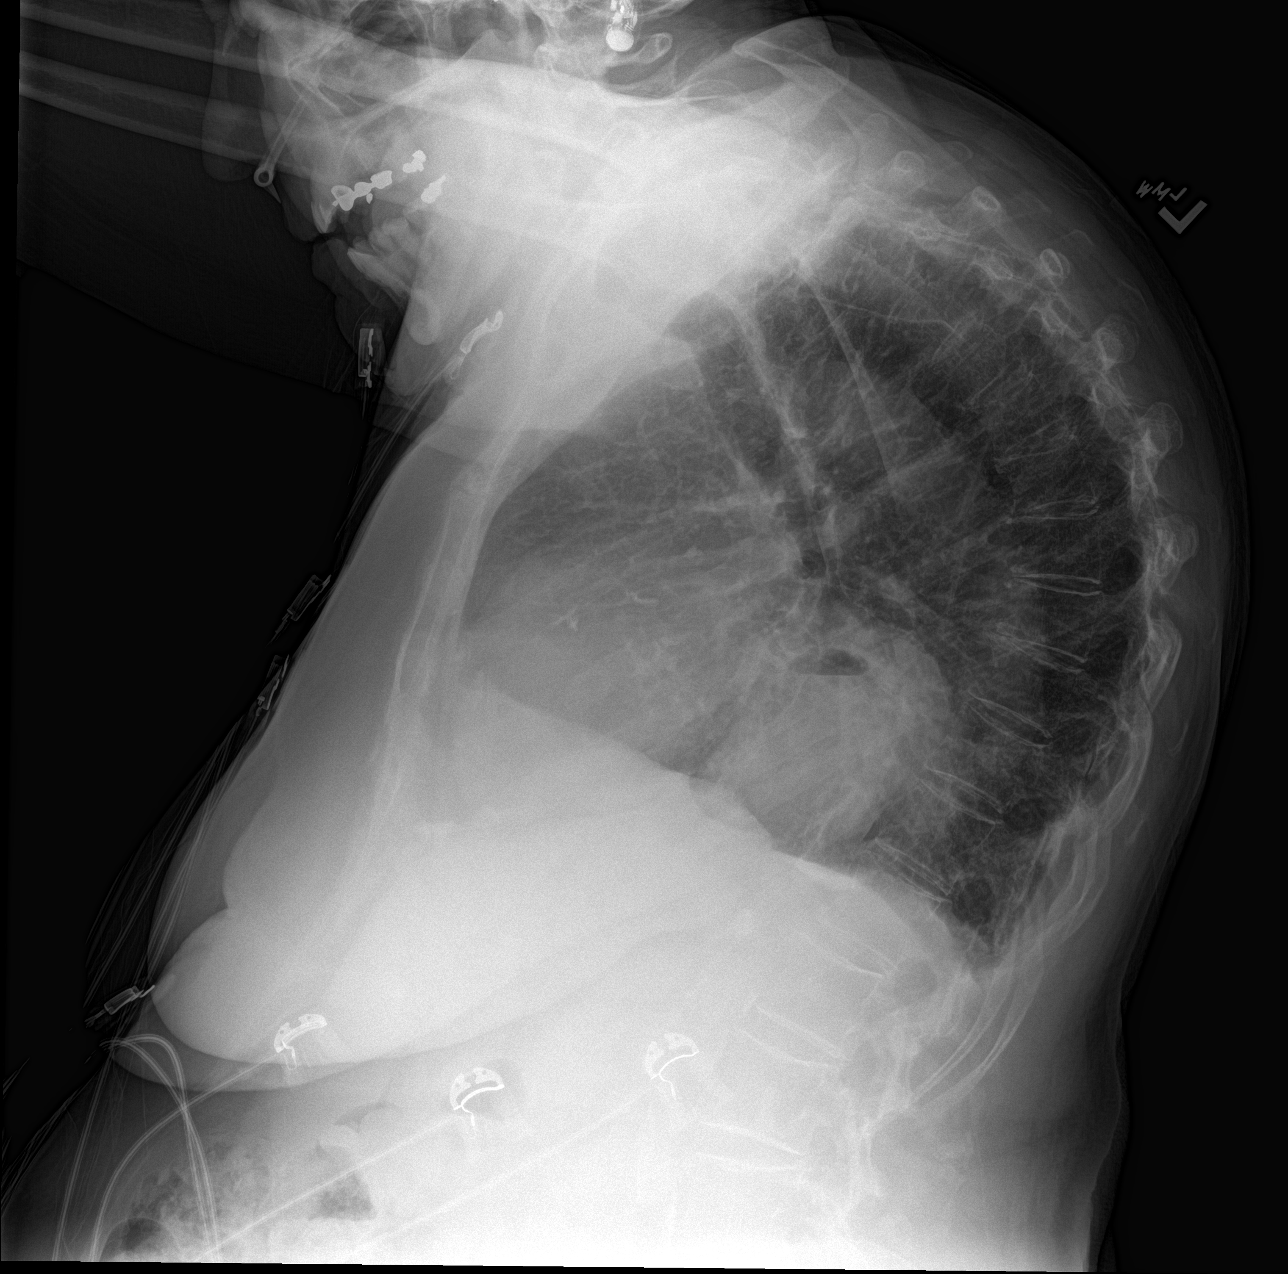

[3 of 3 positions shown; findings below may reference images not displayed]

FINDINGS: Large hiatal hernia. Moderate enlargement of the cardiopericardial
silhouette bibasilar linear opacities favoring atelectasis along the
margins of the hernia.

Atherosclerotic aortic arch. Chondrocalcinosis in the shoulders,
query CPPD arthropathy. Thoracic kyphosis and bony demineralization.
Abdominal aortic atherosclerotic calcification.
IMPRESSION: 1. Moderate enlargement of the cardiopericardial silhouette without
edema.
2. Large hiatal hernia with marginal atelectasis.
3. Atherosclerotic aorta.
4. Chondrocalcinosis.
5. Thoracic kyphosis and bony demineralization.

## 2018-12-20 IMAGING — MG MM DIGITAL SCREENING BILAT W/ CAD
4 series · 4 of 4 positions shown · non-contrast
Comparison: Previous exam(s).

CLINICAL DATA: Screening.

EXAM:
DIGITAL SCREENING BILATERAL MAMMOGRAM WITH CAD

[R MLO]
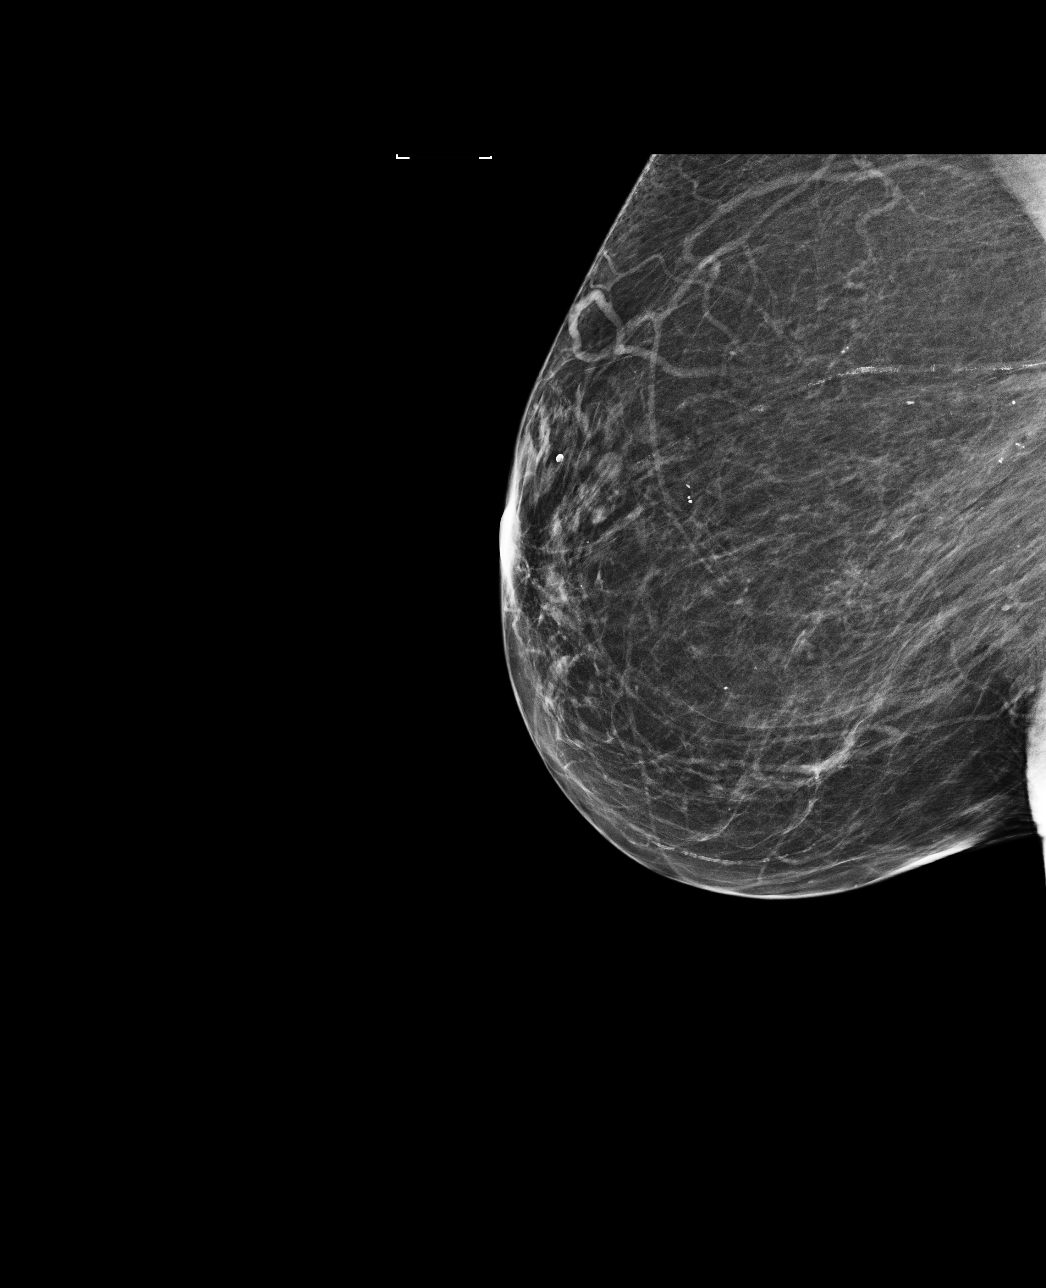

[R CC]
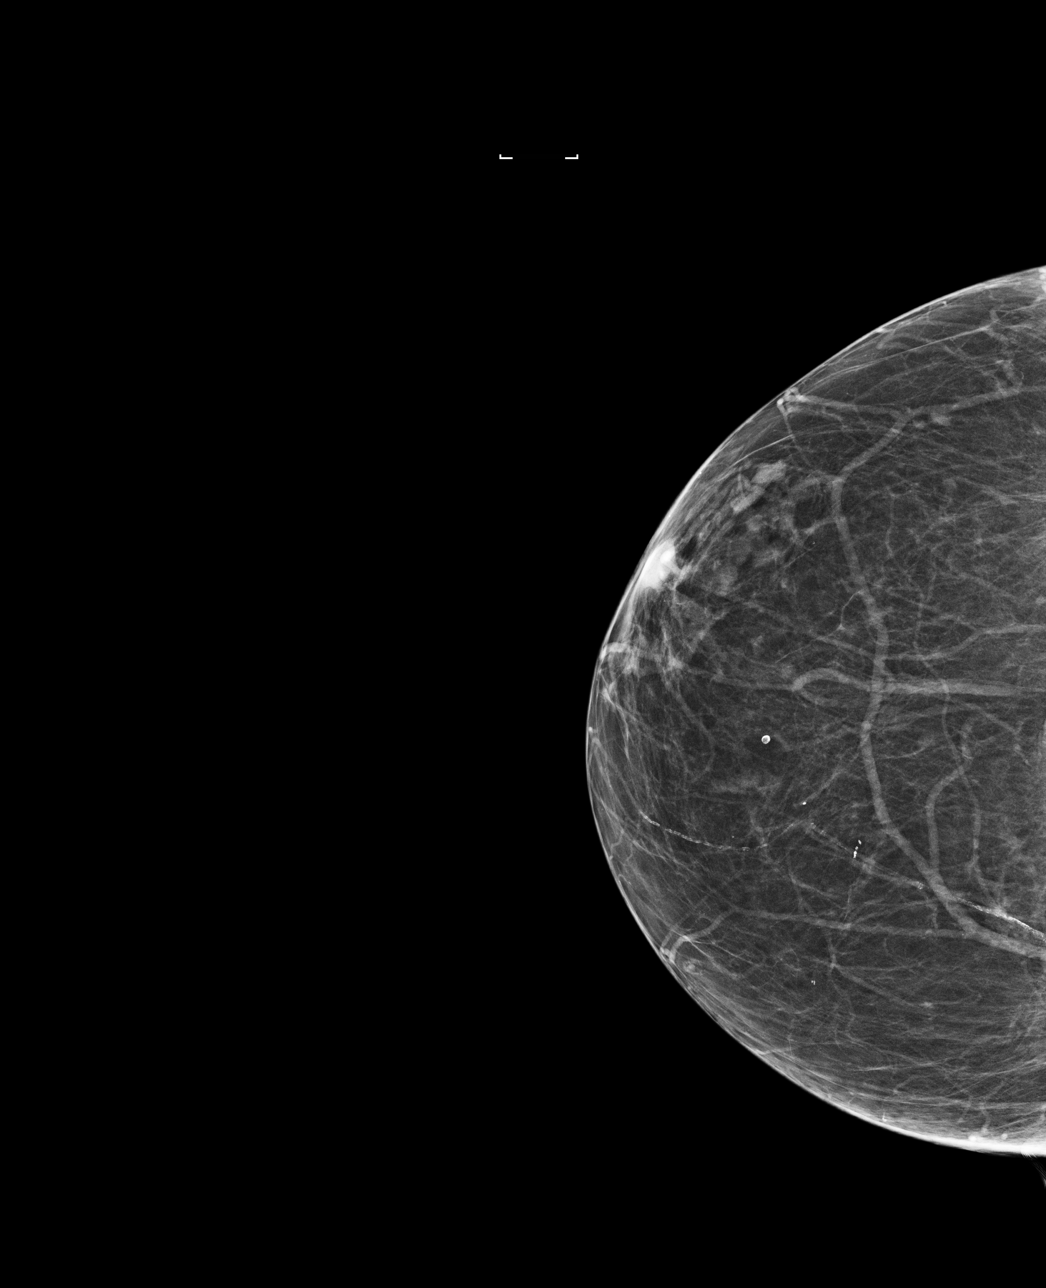

[L CC]
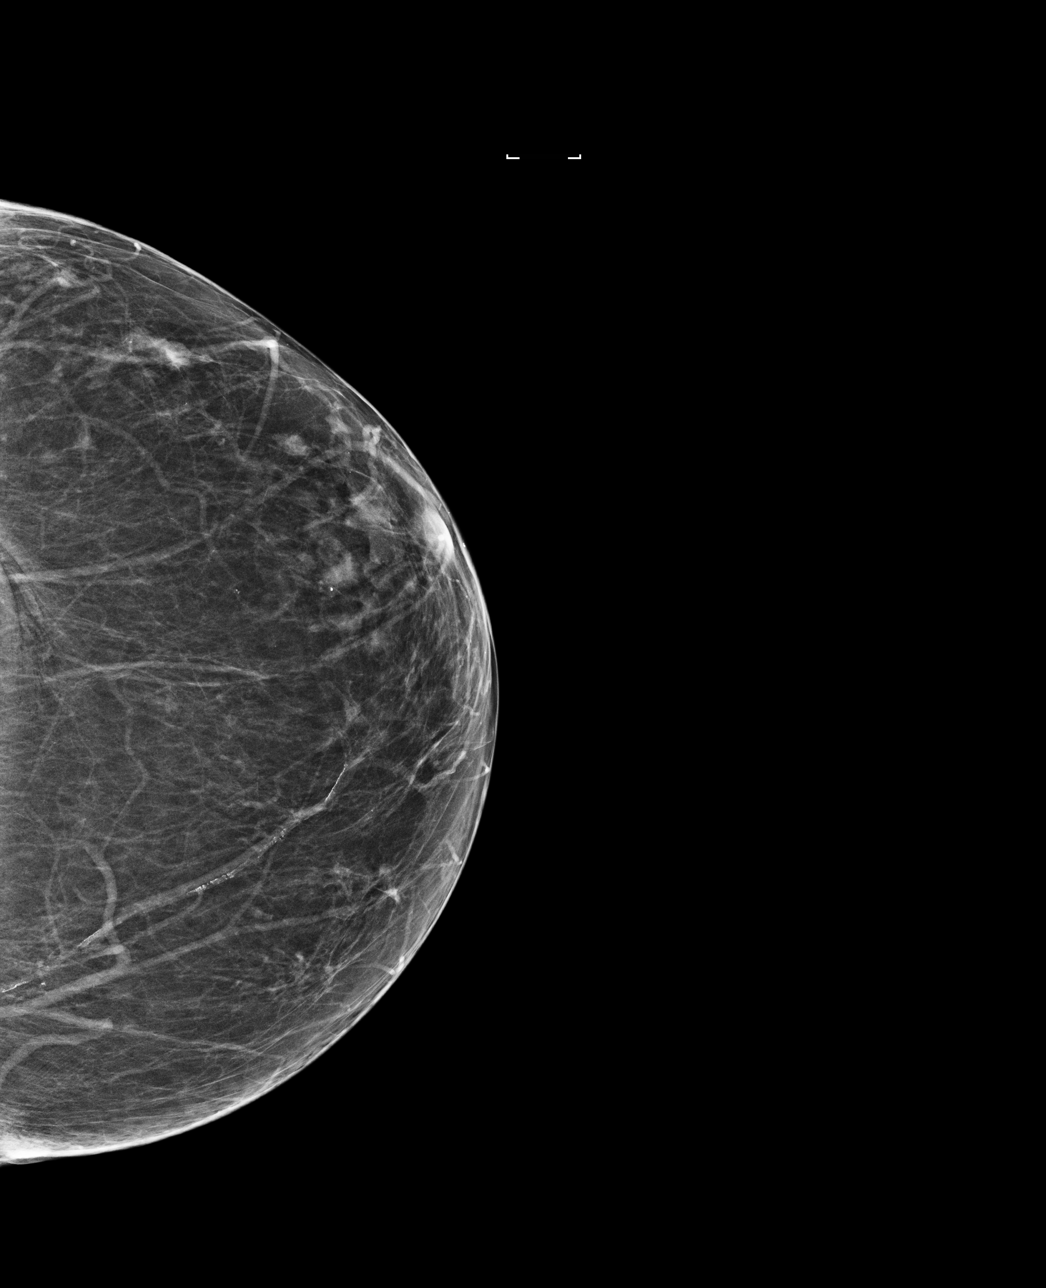

[L MLO]
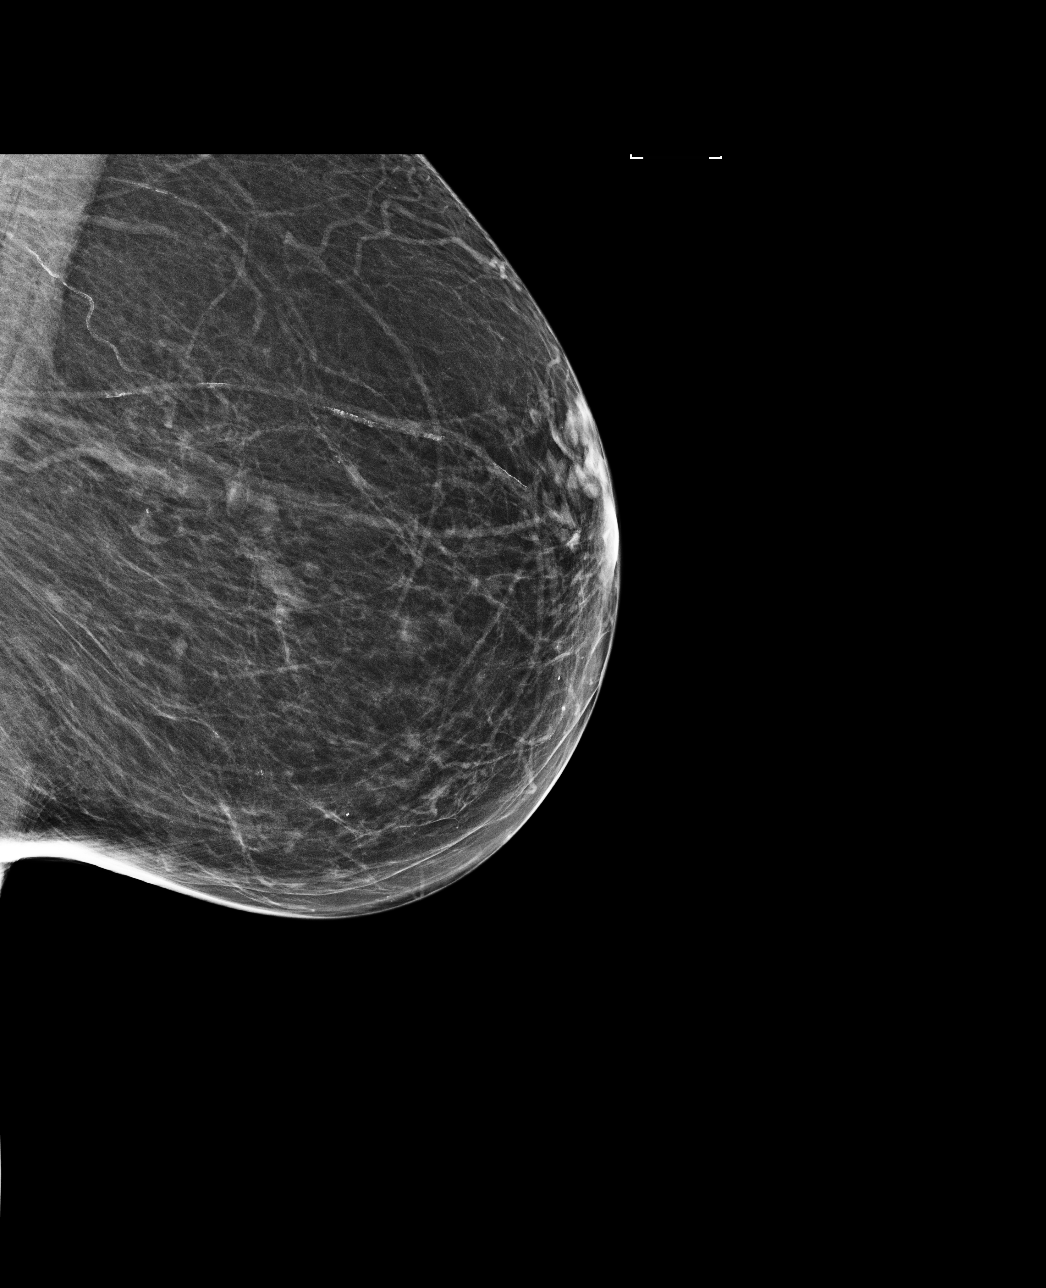

[4 of 4 positions shown; findings below may reference images not displayed]

ACR Breast Density Category b: There are scattered areas of
fibroglandular density.
FINDINGS: There are no findings suspicious for malignancy. Images were
processed with CAD.
IMPRESSION: No mammographic evidence of malignancy. A result letter of this
screening mammogram will be mailed directly to the patient.

RECOMMENDATION:
Screening mammogram in one year. (Code:AS-G-LCT)

BI-RADS CATEGORY  1: Negative.
